# Patient Record
Sex: Male | Born: 1955 | Race: White | Hispanic: No | Marital: Married | Smoking: Former smoker
Health system: Southern US, Community
[De-identification: ages and names within clinical notes are randomized; demographics above are authoritative.]

## PROBLEM LIST (undated history)

## (undated) DIAGNOSIS — Z1211 Encounter for screening for malignant neoplasm of colon: Secondary | ICD-10-CM

## (undated) DIAGNOSIS — K219 Gastro-esophageal reflux disease without esophagitis: Secondary | ICD-10-CM

## (undated) DIAGNOSIS — E119 Type 2 diabetes mellitus without complications: Secondary | ICD-10-CM

## (undated) DIAGNOSIS — D126 Benign neoplasm of colon, unspecified: Secondary | ICD-10-CM

## (undated) DIAGNOSIS — M67919 Unspecified disorder of synovium and tendon, unspecified shoulder: Secondary | ICD-10-CM

## (undated) DIAGNOSIS — E785 Hyperlipidemia, unspecified: Secondary | ICD-10-CM

## (undated) DIAGNOSIS — Z125 Encounter for screening for malignant neoplasm of prostate: Secondary | ICD-10-CM

## (undated) DIAGNOSIS — Z0000 Encounter for general adult medical examination without abnormal findings: Secondary | ICD-10-CM

## (undated) DIAGNOSIS — L309 Dermatitis, unspecified: Secondary | ICD-10-CM

## (undated) DIAGNOSIS — I2510 Atherosclerotic heart disease of native coronary artery without angina pectoris: Secondary | ICD-10-CM

## (undated) DIAGNOSIS — E039 Hypothyroidism, unspecified: Secondary | ICD-10-CM

## (undated) DIAGNOSIS — I10 Essential (primary) hypertension: Secondary | ICD-10-CM

## (undated) DIAGNOSIS — G4700 Insomnia, unspecified: Secondary | ICD-10-CM

## (undated) DIAGNOSIS — M719 Bursopathy, unspecified: Secondary | ICD-10-CM

## (undated) DIAGNOSIS — E7800 Pure hypercholesterolemia, unspecified: Secondary | ICD-10-CM

## (undated) HISTORY — DX: Unspecified disorder of synovium and tendon, unspecified shoulder: M67.919

## (undated) HISTORY — DX: Pure hypercholesterolemia, unspecified: E78.00

## (undated) HISTORY — DX: Type 2 diabetes mellitus without complications: E11.9

## (undated) HISTORY — DX: Encounter for screening for malignant neoplasm of prostate: Z12.5

## (undated) HISTORY — DX: Benign neoplasm of colon, unspecified: D12.6

## (undated) HISTORY — DX: Essential (primary) hypertension: I10

## (undated) HISTORY — DX: Atherosclerotic heart disease of native coronary artery without angina pectoris: I25.10

## (undated) HISTORY — DX: Dermatitis, unspecified: L30.9

## (undated) HISTORY — DX: Insomnia, unspecified: G47.00

## (undated) HISTORY — DX: Encounter for general adult medical examination without abnormal findings: Z00.00

## (undated) HISTORY — DX: Gastro-esophageal reflux disease without esophagitis: K21.9

## (undated) HISTORY — DX: Encounter for screening for malignant neoplasm of colon: Z12.11

## (undated) HISTORY — DX: Hypothyroidism, unspecified: E03.9

## (undated) HISTORY — DX: Hyperlipidemia, unspecified: E78.5

## (undated) HISTORY — DX: Unspecified disorder of synovium and tendon, unspecified shoulder: M71.9

---

## 2000-02-23 HISTORY — PX: HERNIA REPAIR: SHX51

## 2000-04-20 ENCOUNTER — Other Ambulatory Visit: Admission: RE | Admit: 2000-04-20 | Discharge: 2000-04-20 | Payer: Self-pay | Admitting: *Deleted

## 2001-02-22 HISTORY — PX: OTHER SURGICAL HISTORY: SHX169

## 2002-01-01 ENCOUNTER — Ambulatory Visit (HOSPITAL_COMMUNITY): Admission: RE | Admit: 2002-01-01 | Discharge: 2002-01-01 | Payer: Self-pay | Admitting: Family Medicine

## 2002-01-01 ENCOUNTER — Encounter: Payer: Self-pay | Admitting: Family Medicine

## 2002-01-04 ENCOUNTER — Ambulatory Visit (HOSPITAL_COMMUNITY): Admission: RE | Admit: 2002-01-04 | Discharge: 2002-01-04 | Payer: Self-pay | Admitting: Family Medicine

## 2002-01-04 ENCOUNTER — Encounter: Payer: Self-pay | Admitting: Family Medicine

## 2002-01-10 ENCOUNTER — Encounter: Payer: Self-pay | Admitting: Family Medicine

## 2002-01-10 ENCOUNTER — Encounter (INDEPENDENT_AMBULATORY_CARE_PROVIDER_SITE_OTHER): Payer: Self-pay | Admitting: Specialist

## 2002-01-10 ENCOUNTER — Ambulatory Visit (HOSPITAL_COMMUNITY): Admission: RE | Admit: 2002-01-10 | Discharge: 2002-01-10 | Payer: Self-pay | Admitting: Family Medicine

## 2002-02-22 HISTORY — PX: THYROIDECTOMY: SHX17

## 2002-06-11 ENCOUNTER — Encounter: Payer: Self-pay | Admitting: General Surgery

## 2002-06-16 ENCOUNTER — Inpatient Hospital Stay (HOSPITAL_COMMUNITY): Admission: RE | Admit: 2002-06-16 | Discharge: 2002-06-17 | Payer: Self-pay | Admitting: General Surgery

## 2003-02-23 HISTORY — PX: DG ESOPHAGUS -BA SW: HXRAD302

## 2003-03-25 ENCOUNTER — Ambulatory Visit (HOSPITAL_COMMUNITY): Admission: RE | Admit: 2003-03-25 | Discharge: 2003-03-25 | Payer: Self-pay | Admitting: General Surgery

## 2003-05-24 ENCOUNTER — Ambulatory Visit (HOSPITAL_COMMUNITY): Admission: RE | Admit: 2003-05-24 | Discharge: 2003-05-24 | Payer: Self-pay | Admitting: General Surgery

## 2004-02-23 ENCOUNTER — Encounter: Payer: Self-pay | Admitting: Family Medicine

## 2005-12-15 ENCOUNTER — Ambulatory Visit: Payer: Self-pay | Admitting: Family Medicine

## 2005-12-29 ENCOUNTER — Ambulatory Visit: Payer: Self-pay | Admitting: Family Medicine

## 2006-01-13 IMAGING — RF DG ESOPHAGUS
12 of 14 series · 18 of 24 positions shown · non-contrast
Comparison: none

[Series 1: run · 1 of 1 slices shown (1 of 12)]
[im 1/1]
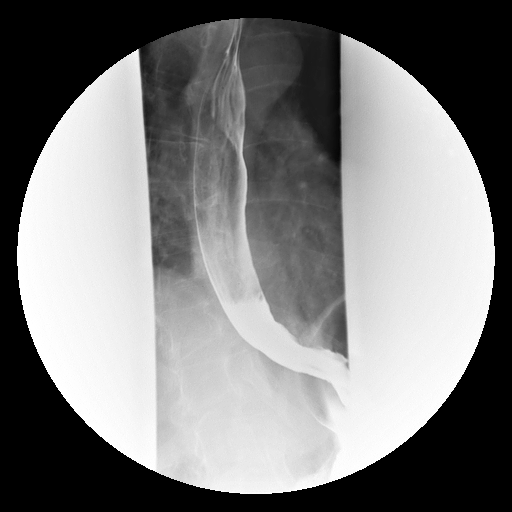

[Series 3: run · 1 of 1 slices shown (2 of 12)]
[im 1/1]
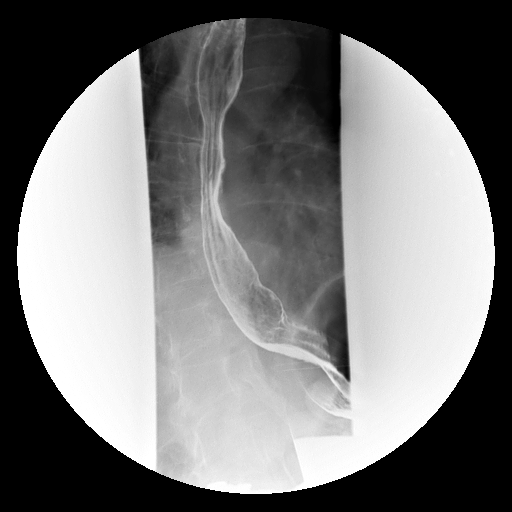

[Series 4: run · 1 of 1 slices shown (3 of 12)]
[im 1/1]
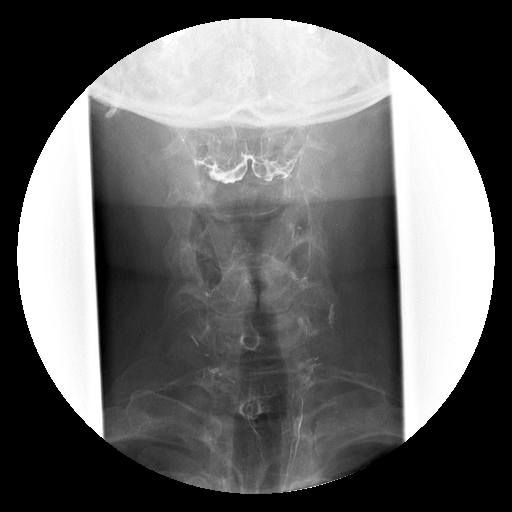

[Series 5: run · 1 of 1 slices shown (4 of 12)]
[im 1/1]
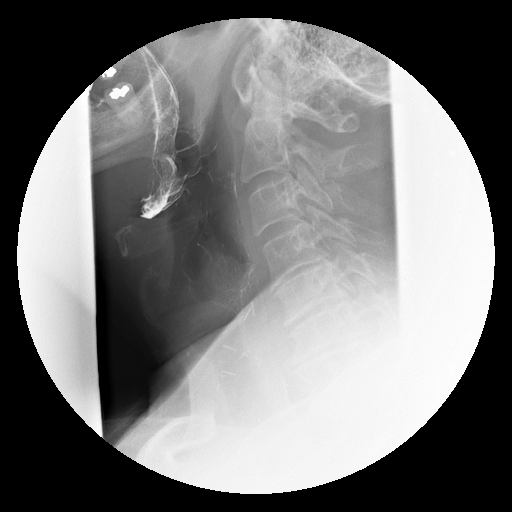

[Series 6: run · 2 of 4 slices shown (5 of 12)]
[im 2/4]
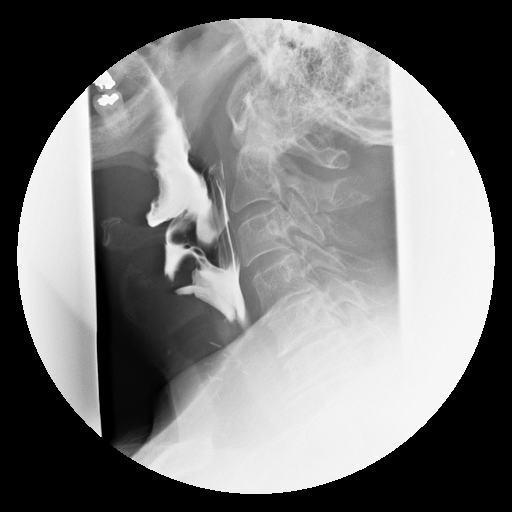
[im 4/4]
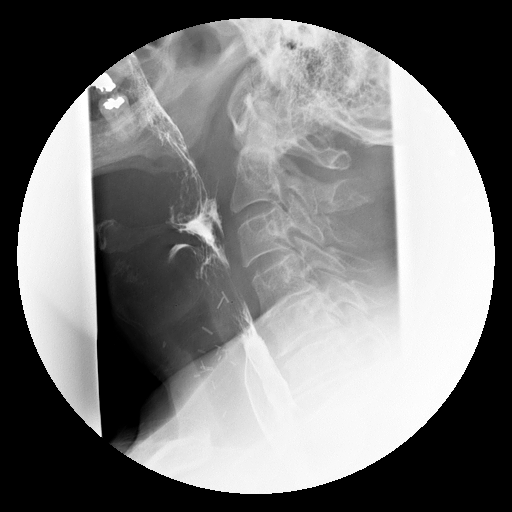

[Series 7: run · 2 of 4 slices shown (6 of 12)]
[im 1/4]
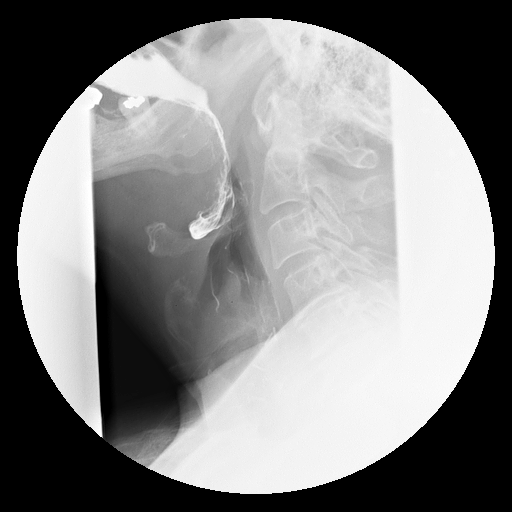
[im 4/4]
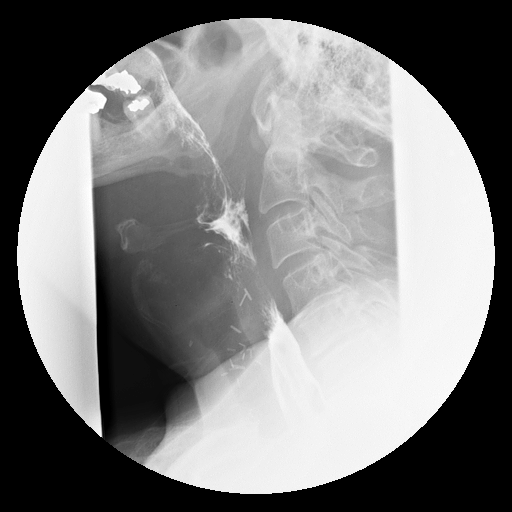

[Series 8: run · 4 of 5 slices shown (7 of 12)]
[im 1/5]
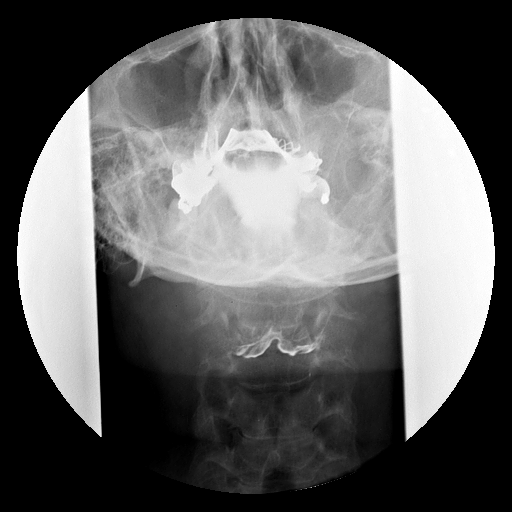
[im 2/5]
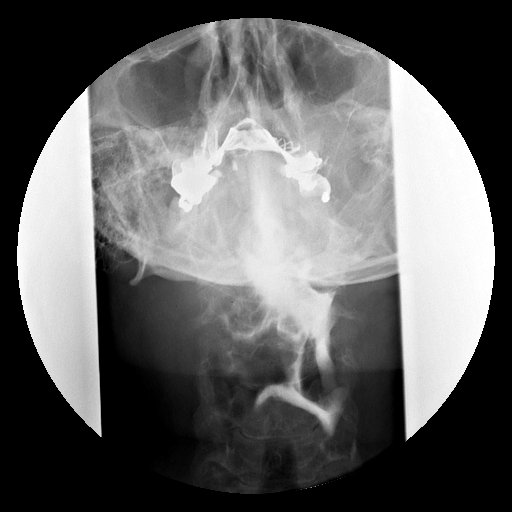
[im 4/5]
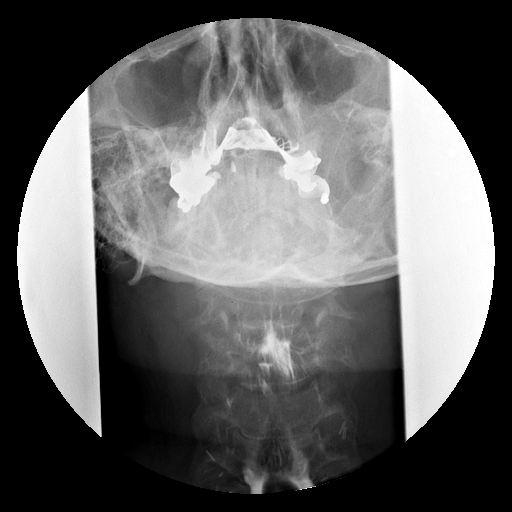
[im 5/5]
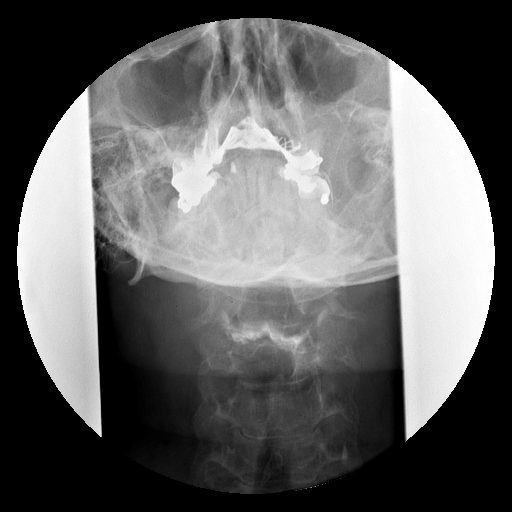

[Series 9: run · 2 of 3 slices shown (8 of 12)]
[im 1/3]
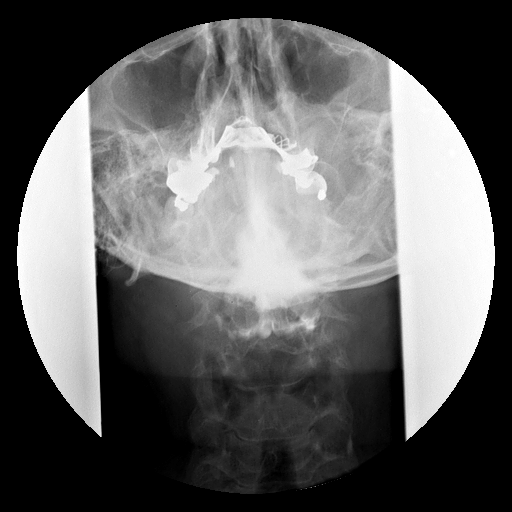
[im 3/3]
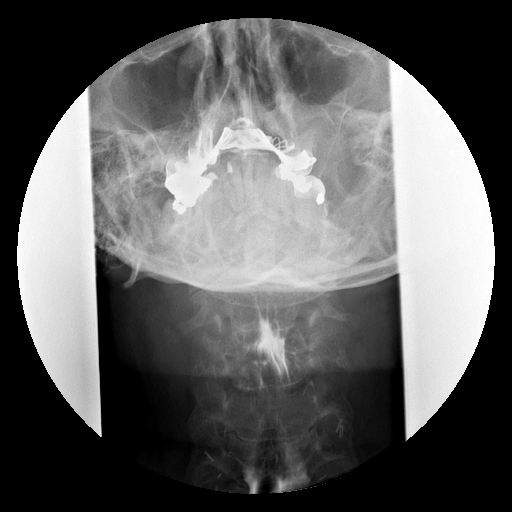

[Series 11: run · 1 of 1 slices shown (9 of 12)]
[im 1/1]
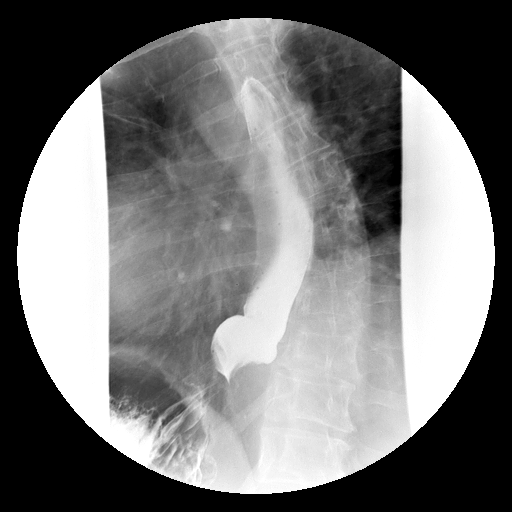

[Series 12: run · 1 of 1 slices shown (10 of 12)]
[im 1/1]
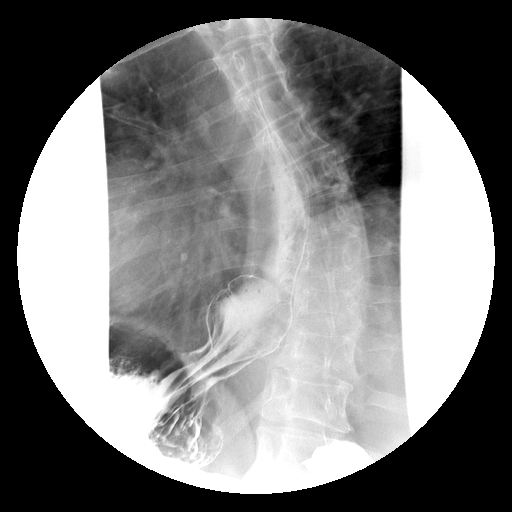

[Series 16: run · 1 of 1 slices shown (11 of 12)]
[im 1/1]
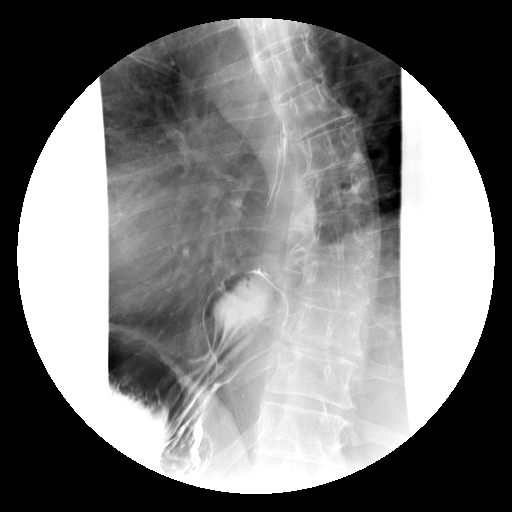

[Series 17: run · 1 of 1 slices shown (12 of 12)]
[im 1/1]
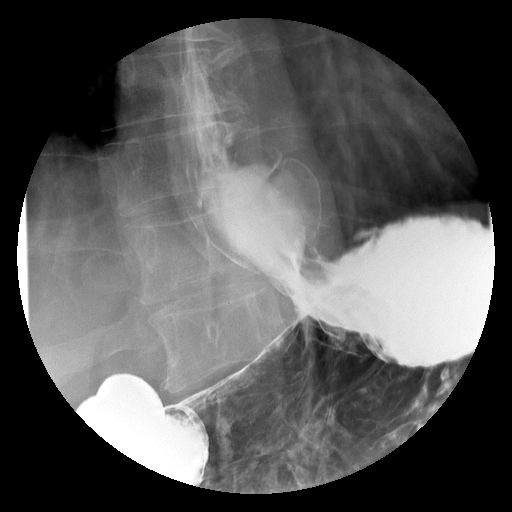

[18 of 24 positions shown; findings below may reference images not displayed]

<DIV class=VectionV><P class=MsoNormal><FONT size=3>Clinical data:<SPAN style="mso-spacerun: yes">  </SPAN><SPAN style="mso-spacerun: yes"> </SPAN>47 year old status post thyroidectomy.<SPAN style="mso-spacerun: yes">  </SPAN>Now presenting with cervical dysphagia.</FONT>
<P class=MsoNormal><FONT size=3>ESOPHAGRAM ? 03/25/03 </FONT>
<P class=MsoNormal><FONT size=3>Comparison:<SPAN style="mso-spacerun: yes">  </SPAN>None. </FONT>
<P class=MsoNormal><FONT size=3>The patient swallowed the thick and thin barium liquid without difficulty. Esophageal peristalsis appeared normal.<SPAN style="mso-spacerun: yes">  </SPAN>There is a moderate sized hiatal hernia, and gastroesophageal reflux was elicited with the use of the water siphon maneuver.<SPAN style="mso-spacerun: yes">  </SPAN>There is no radiographic evidence of esophagitis. No fixed esophageal strictures or masses were identified.</FONT>
<P class=MsoNormal><FONT size=3>Rapid sequence evaluation of the cervical esophagus during swallowing demonstrates trace laryngeal penetration.<SPAN style="mso-spacerun: yes">  </SPAN>There is no evidence of tracheal aspiration. <SPAN style="mso-spacerun: yes"> </SPAN>There may be weakness in the left side of the pharynx, as on one of the swallows in the AP projection, the majority of the thin barium liquid passed through the left side of the pharynx.<SPAN style="mso-spacerun: yes">  </SPAN></FONT>
<P class=MsoNormal><FONT size=3>IMPRESSION</FONT>
<P class=MsoNormal><FONT size=3>1.<SPAN style="mso-spacerun: yes">  </SPAN>Trace laryngeal penetration with thin barium liquid.<SPAN style="mso-spacerun: yes">  </SPAN>No evidence of tracheal aspiration.</FONT>
<P class=MsoNormal><FONT size=3>2.<SPAN style="mso-spacerun: yes">  </SPAN>Query left pharyngeal muscle weakness.</FONT>
<P class=MsoNormal><FONT size=3>3.<SPAN style="mso-spacerun: yes">  </SPAN>Moderate hiatal hernia with gastroesophageal reflux.</FONT>
<P class=MsoNormal><FONT size=3>4.<SPAN style="mso-spacerun: yes">  </SPAN>No evidence of fixed esophageal strictures or masses.</FONT>
<P class=MsoNormal><FONT size=3> </FONT>
</DIV>

## 2006-03-14 ENCOUNTER — Ambulatory Visit: Payer: Self-pay | Admitting: Family Medicine

## 2006-03-14 IMAGING — CT CT NECK W/ CM
1 of 3 series · 8 of 14 positions shown, 10 images · IV contrast (omnipaque)
Comparison: none

CLINICAL DATA: Patient with dysphagia, status post thyroidectomy.
 CT SCAN OF THE NECK WITH CONTRAST
 Multiple spiral images were made through the neck after the intravenous injection of 977cc Omnipaque 300.  Spiral images through the neck demonstrate normal salivary glands.  There are normal pharyngeal soft tissues.  No asymmetry is present.  There is no adenopathy.  The jugular veins and carotid arteries are widely patent.  There is no residual thyroid tissue evident.  
 IMPRESSION
 Normal CT scan of the neck status post thyroidectomy.

[Series 2: neck 3.0 b10s · axial · 0.39mm/px · z∈[-794,-626]mm · 8 of 73 slices shown, 10 images]
[im 9/73  soft-tissue]
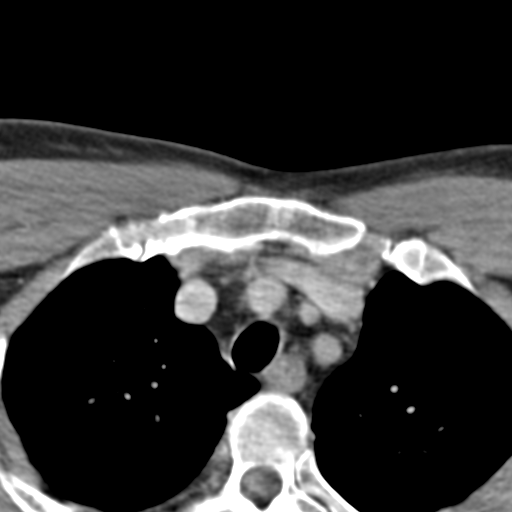
[im 9/73  bone]
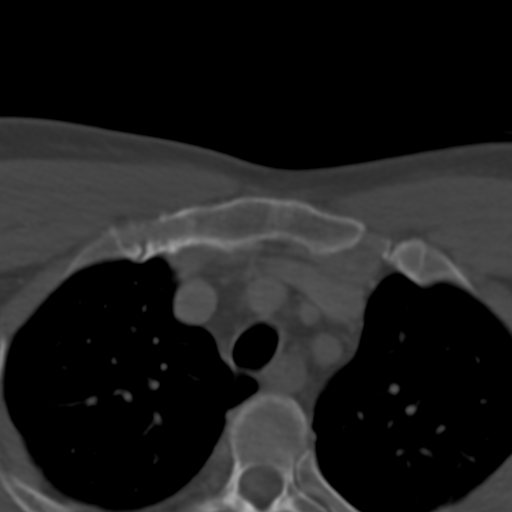
[im 17/73  bone]
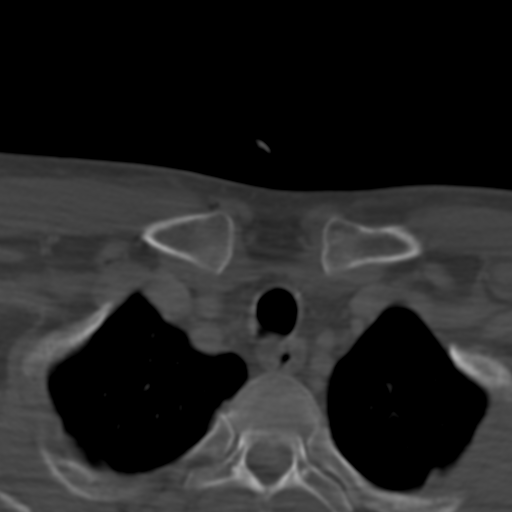
[im 25/73  bone]
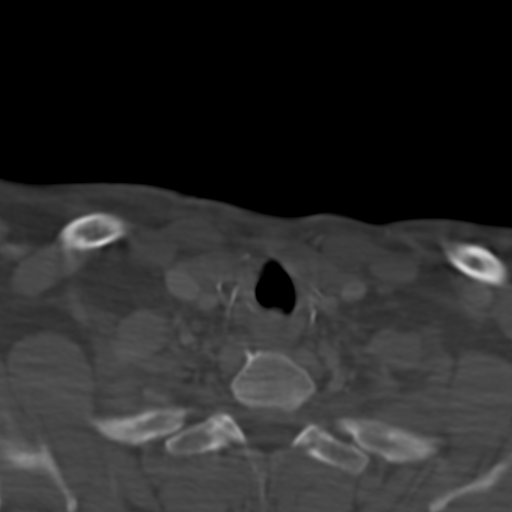
[im 33/73  bone]
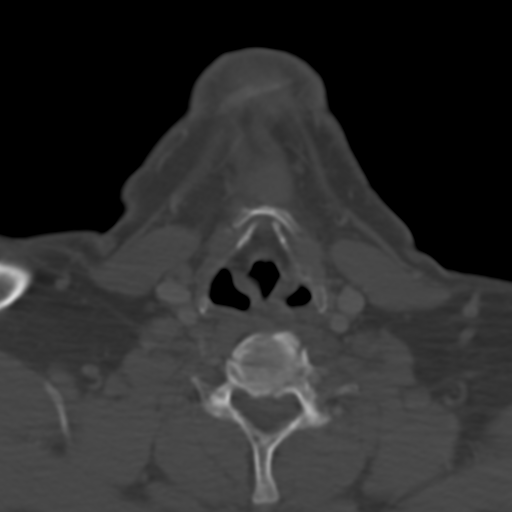
[im 41/73  soft-tissue]
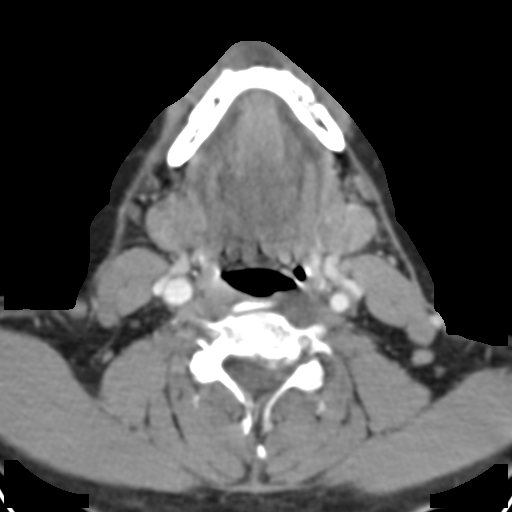
[im 41/73  bone]
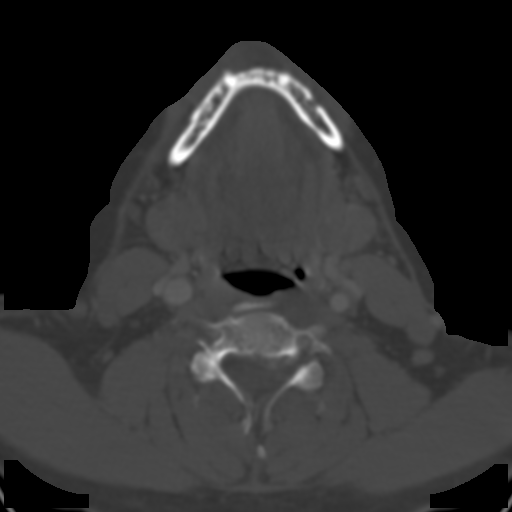
[im 49/73  bone]
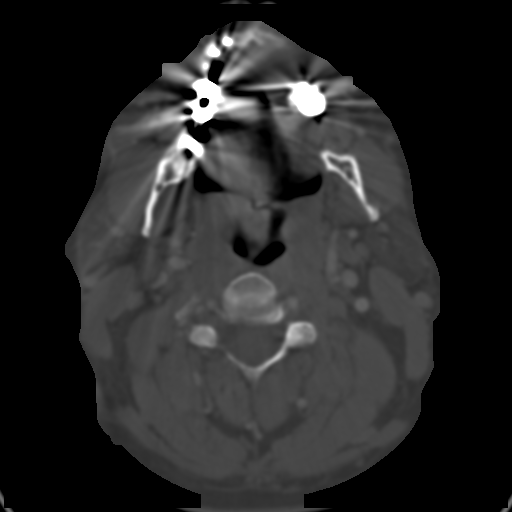
[im 57/73  bone]
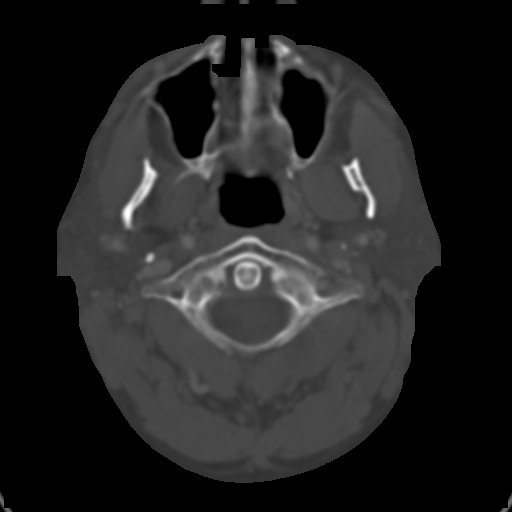
[im 65/73  bone]
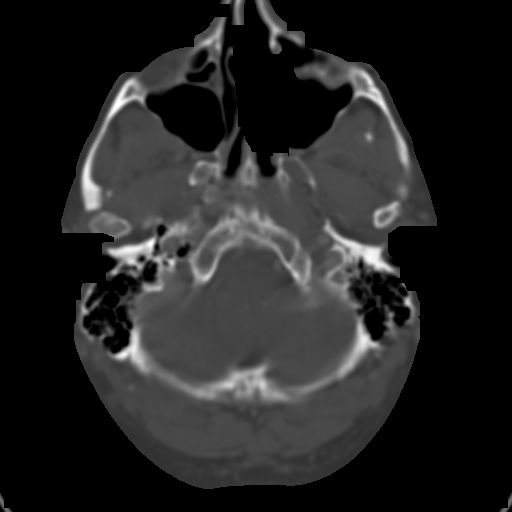

[8 of 14 positions shown; findings below may reference images not displayed]

## 2006-03-22 ENCOUNTER — Ambulatory Visit: Payer: Self-pay | Admitting: Family Medicine

## 2006-03-22 LAB — CONVERTED CEMR LAB
Folate: 10.1 ng/mL
TSH: 0.2 microintl units/mL — ABNORMAL LOW (ref 0.35–5.50)
Vitamin B-12: 217 pg/mL (ref 211–911)

## 2006-03-25 ENCOUNTER — Ambulatory Visit: Payer: Self-pay | Admitting: Family Medicine

## 2006-05-23 ENCOUNTER — Ambulatory Visit: Payer: Self-pay | Admitting: Family Medicine

## 2006-05-23 LAB — CONVERTED CEMR LAB: TSH: 1.87 microintl units/mL (ref 0.35–5.50)

## 2006-06-02 ENCOUNTER — Encounter: Payer: Self-pay | Admitting: Family Medicine

## 2006-06-15 ENCOUNTER — Encounter: Payer: Self-pay | Admitting: Family Medicine

## 2006-06-15 DIAGNOSIS — I10 Essential (primary) hypertension: Secondary | ICD-10-CM | POA: Insufficient documentation

## 2006-06-15 DIAGNOSIS — K219 Gastro-esophageal reflux disease without esophagitis: Secondary | ICD-10-CM

## 2006-06-15 DIAGNOSIS — E7800 Pure hypercholesterolemia, unspecified: Secondary | ICD-10-CM | POA: Insufficient documentation

## 2006-06-15 DIAGNOSIS — E1169 Type 2 diabetes mellitus with other specified complication: Secondary | ICD-10-CM | POA: Insufficient documentation

## 2006-06-15 DIAGNOSIS — E039 Hypothyroidism, unspecified: Secondary | ICD-10-CM | POA: Insufficient documentation

## 2006-06-20 DIAGNOSIS — G4700 Insomnia, unspecified: Secondary | ICD-10-CM | POA: Insufficient documentation

## 2007-01-04 ENCOUNTER — Encounter (INDEPENDENT_AMBULATORY_CARE_PROVIDER_SITE_OTHER): Payer: Self-pay | Admitting: *Deleted

## 2007-02-23 DIAGNOSIS — D126 Benign neoplasm of colon, unspecified: Secondary | ICD-10-CM

## 2007-02-23 HISTORY — PX: CARDIAC CATHETERIZATION: SHX172

## 2007-02-23 HISTORY — DX: Benign neoplasm of colon, unspecified: D12.6

## 2007-02-24 ENCOUNTER — Telehealth: Payer: Self-pay | Admitting: Family Medicine

## 2007-04-17 ENCOUNTER — Ambulatory Visit: Payer: Self-pay | Admitting: Cardiovascular Disease

## 2007-04-17 ENCOUNTER — Observation Stay (HOSPITAL_COMMUNITY): Admission: EM | Admit: 2007-04-17 | Discharge: 2007-04-18 | Payer: Self-pay | Admitting: Emergency Medicine

## 2007-04-18 ENCOUNTER — Ambulatory Visit: Payer: Self-pay | Admitting: Internal Medicine

## 2007-04-18 ENCOUNTER — Encounter (INDEPENDENT_AMBULATORY_CARE_PROVIDER_SITE_OTHER): Payer: Self-pay | Admitting: Cardiovascular Disease

## 2007-04-18 ENCOUNTER — Ambulatory Visit: Payer: Self-pay | Admitting: Surgery

## 2007-04-19 ENCOUNTER — Telehealth: Payer: Self-pay | Admitting: Family Medicine

## 2007-04-25 ENCOUNTER — Ambulatory Visit: Payer: Self-pay | Admitting: Family Medicine

## 2007-04-25 DIAGNOSIS — I2510 Atherosclerotic heart disease of native coronary artery without angina pectoris: Secondary | ICD-10-CM | POA: Insufficient documentation

## 2007-04-25 LAB — CONVERTED CEMR LAB
HDL: 33.7 mg/dL
Hgb A1c MFr Bld: 6.2 %
LDL Cholesterol: 91 mg/dL

## 2007-05-05 ENCOUNTER — Telehealth: Payer: Self-pay | Admitting: Family Medicine

## 2007-05-11 ENCOUNTER — Ambulatory Visit: Payer: Self-pay | Admitting: Family Medicine

## 2007-06-12 ENCOUNTER — Ambulatory Visit: Payer: Self-pay | Admitting: Gastroenterology

## 2007-06-21 ENCOUNTER — Telehealth: Payer: Self-pay | Admitting: Family Medicine

## 2007-06-26 ENCOUNTER — Encounter: Payer: Self-pay | Admitting: Family Medicine

## 2007-06-26 ENCOUNTER — Encounter: Payer: Self-pay | Admitting: Gastroenterology

## 2007-06-26 ENCOUNTER — Ambulatory Visit: Payer: Self-pay | Admitting: Gastroenterology

## 2007-06-28 ENCOUNTER — Encounter: Payer: Self-pay | Admitting: Gastroenterology

## 2007-07-28 ENCOUNTER — Ambulatory Visit: Payer: Self-pay | Admitting: Family Medicine

## 2007-07-31 LAB — CONVERTED CEMR LAB
AST: 26 units/L (ref 0–37)
Alkaline Phosphatase: 62 units/L (ref 39–117)
Bilirubin, Direct: 0.1 mg/dL (ref 0.0–0.3)
Chloride: 101 meq/L (ref 96–112)
Cholesterol: 147 mg/dL (ref 0–200)
GFR calc Af Amer: 82 mL/min
GFR calc non Af Amer: 68 mL/min
Glucose, Bld: 121 mg/dL — ABNORMAL HIGH (ref 70–99)
LDL Cholesterol: 86 mg/dL (ref 0–99)
Potassium: 3.8 meq/L (ref 3.5–5.1)
Sodium: 139 meq/L (ref 135–145)
VLDL: 29 mg/dL (ref 0–40)

## 2007-09-27 ENCOUNTER — Telehealth: Payer: Self-pay | Admitting: Family Medicine

## 2007-11-01 ENCOUNTER — Ambulatory Visit: Payer: Self-pay | Admitting: Family Medicine

## 2007-11-08 DIAGNOSIS — E119 Type 2 diabetes mellitus without complications: Secondary | ICD-10-CM | POA: Insufficient documentation

## 2007-11-08 DIAGNOSIS — E1159 Type 2 diabetes mellitus with other circulatory complications: Secondary | ICD-10-CM | POA: Insufficient documentation

## 2007-11-13 LAB — CONVERTED CEMR LAB
Bilirubin, Direct: 0.1 mg/dL (ref 0.0–0.3)
Calcium: 9 mg/dL (ref 8.4–10.5)
GFR calc Af Amer: 82 mL/min
GFR calc non Af Amer: 68 mL/min
HDL: 32.8 mg/dL — ABNORMAL LOW (ref >39.0)
Potassium: 4 meq/L (ref 3.5–5.1)
Sodium: 142 meq/L (ref 135–145)
Total Bilirubin: 1 mg/dL (ref 0.3–1.2)
Total CHOL/HDL Ratio: 4.5
VLDL: 25 mg/dL (ref 0–40)

## 2007-12-01 ENCOUNTER — Telehealth: Payer: Self-pay | Admitting: Family Medicine

## 2008-02-08 ENCOUNTER — Ambulatory Visit: Payer: Self-pay | Admitting: Family Medicine

## 2008-02-08 LAB — CONVERTED CEMR LAB
ALT: 38 units/L (ref 0–53)
CO2: 33 meq/L — ABNORMAL HIGH (ref 19–32)
Calcium: 9.6 mg/dL (ref 8.4–10.5)
Creatinine, Ser: 1.1 mg/dL (ref 0.4–1.5)
GFR calc Af Amer: 91 mL/min
GFR calc non Af Amer: 75 mL/min
HDL: 37.5 mg/dL — ABNORMAL LOW (ref >39.0)
Hgb A1c MFr Bld: 6.1 % — ABNORMAL HIGH (ref 4.6–6.0)
LDL Cholesterol: 74 mg/dL (ref 0–99)
Total Bilirubin: 0.9 mg/dL (ref 0.3–1.2)
Total CHOL/HDL Ratio: 3.4
Triglycerides: 82 mg/dL (ref 0–149)
VLDL: 16 mg/dL (ref 0–40)

## 2008-02-19 ENCOUNTER — Telehealth: Payer: Self-pay | Admitting: Family Medicine

## 2008-04-29 ENCOUNTER — Telehealth: Payer: Self-pay | Admitting: Family Medicine

## 2008-06-13 ENCOUNTER — Ambulatory Visit: Payer: Self-pay | Admitting: Family Medicine

## 2008-06-14 LAB — CONVERTED CEMR LAB
ALT: 37 U/L (ref 0–53)
AST: 32 U/L (ref 0–37)
Albumin: 4.1 g/dL (ref 3.5–5.2)
Alkaline Phosphatase: 76 U/L (ref 39–117)
BUN: 13 mg/dL (ref 6–23)
Bilirubin, Direct: 0.1 mg/dL (ref 0.0–0.3)
CO2: 33 meq/L — ABNORMAL HIGH (ref 19–32)
Calcium: 9.4 mg/dL (ref 8.4–10.5)
Chloride: 104 meq/L (ref 96–112)
Cholesterol: 147 mg/dL (ref 0–200)
Creatinine, Ser: 1.2 mg/dL (ref 0.4–1.5)
Glucose, Bld: 110 mg/dL — ABNORMAL HIGH (ref 70–99)
HDL: 40.1 mg/dL (ref >39.00)
Hgb A1c MFr Bld: 6 % (ref 4.6–6.5)
LDL Cholesterol: 87 mg/dL (ref 0–99)
Phosphorus: 3.7 mg/dL (ref 2.3–4.6)
Potassium: 4.4 meq/L (ref 3.5–5.1)
Sodium: 142 meq/L (ref 135–145)
Total Bilirubin: 0.9 mg/dL (ref 0.3–1.2)
Total CHOL/HDL Ratio: 4
Total Protein: 6.7 g/dL (ref 6.0–8.3)
Triglycerides: 98 mg/dL (ref 0.0–149.0)
VLDL: 19.6 mg/dL (ref 0.0–40.0)

## 2008-06-19 ENCOUNTER — Ambulatory Visit: Payer: Self-pay | Admitting: Family Medicine

## 2008-07-10 DIAGNOSIS — E785 Hyperlipidemia, unspecified: Secondary | ICD-10-CM

## 2008-07-11 ENCOUNTER — Ambulatory Visit: Payer: Self-pay | Admitting: Cardiovascular Disease

## 2008-07-11 DIAGNOSIS — R0602 Shortness of breath: Secondary | ICD-10-CM | POA: Insufficient documentation

## 2008-07-26 ENCOUNTER — Ambulatory Visit: Payer: Self-pay

## 2008-07-26 ENCOUNTER — Ambulatory Visit: Payer: Self-pay | Admitting: Cardiovascular Disease

## 2008-08-05 ENCOUNTER — Telehealth: Payer: Self-pay | Admitting: Family Medicine

## 2008-08-21 ENCOUNTER — Ambulatory Visit: Payer: Self-pay | Admitting: Family Medicine

## 2008-08-23 LAB — CONVERTED CEMR LAB: PSA: 0.96 ng/mL (ref 0.10–4.00)

## 2008-08-30 ENCOUNTER — Ambulatory Visit: Payer: Self-pay | Admitting: Family Medicine

## 2008-08-30 DIAGNOSIS — R1031 Right lower quadrant pain: Secondary | ICD-10-CM | POA: Insufficient documentation

## 2008-08-30 LAB — CONVERTED CEMR LAB
Blood in Urine, dipstick: NEGATIVE
Glucose, Urine, Semiquant: NEGATIVE
Ketones, urine, test strip: NEGATIVE

## 2008-11-26 ENCOUNTER — Ambulatory Visit: Payer: Self-pay | Admitting: Family Medicine

## 2008-11-28 LAB — CONVERTED CEMR LAB
ALT: 29 units/L (ref 0–53)
Albumin: 4.5 g/dL (ref 3.5–5.2)
Alkaline Phosphatase: 58 units/L (ref 39–117)
CO2: 33 meq/L — ABNORMAL HIGH (ref 19–32)
Calcium: 9.7 mg/dL (ref 8.4–10.5)
Chloride: 105 meq/L (ref 96–112)
Creatinine, Ser: 1.1 mg/dL (ref 0.4–1.5)
Glucose, Bld: 104 mg/dL — ABNORMAL HIGH (ref 70–99)
HDL: 43.4 mg/dL (ref >39.00)
Sodium: 141 meq/L (ref 135–145)
Total Protein: 7.6 g/dL (ref 6.0–8.3)
Triglycerides: 123 mg/dL (ref 0.0–149.0)

## 2008-12-17 ENCOUNTER — Encounter: Payer: Self-pay | Admitting: Family Medicine

## 2008-12-18 ENCOUNTER — Ambulatory Visit: Payer: Self-pay | Admitting: Family Medicine

## 2008-12-18 DIAGNOSIS — R0789 Other chest pain: Secondary | ICD-10-CM | POA: Insufficient documentation

## 2008-12-18 DIAGNOSIS — J310 Chronic rhinitis: Secondary | ICD-10-CM | POA: Insufficient documentation

## 2009-12-12 ENCOUNTER — Telehealth (INDEPENDENT_AMBULATORY_CARE_PROVIDER_SITE_OTHER): Payer: Self-pay | Admitting: *Deleted

## 2009-12-12 ENCOUNTER — Ambulatory Visit: Payer: Self-pay | Admitting: Family Medicine

## 2009-12-12 LAB — CONVERTED CEMR LAB
ALT: 24 units/L (ref 0–53)
AST: 25 units/L (ref 0–37)
Alkaline Phosphatase: 65 units/L (ref 39–117)
Bilirubin, Direct: 0.1 mg/dL (ref 0.0–0.3)
CO2: 33 meq/L — ABNORMAL HIGH (ref 19–32)
Chloride: 104 meq/L (ref 96–112)
Creatinine, Ser: 1.1 mg/dL (ref 0.4–1.5)
Microalb Creat Ratio: 0.4 mg/g (ref 0.0–30.0)
PSA: 1.62 ng/mL (ref 0.10–4.00)
Potassium: 4.6 meq/L (ref 3.5–5.1)
Sodium: 142 meq/L (ref 135–145)
Total CHOL/HDL Ratio: 3
Total Protein: 6.5 g/dL (ref 6.0–8.3)
Triglycerides: 72 mg/dL (ref 0.0–149.0)

## 2009-12-17 ENCOUNTER — Ambulatory Visit: Payer: Self-pay | Admitting: Family Medicine

## 2009-12-17 DIAGNOSIS — Z87898 Personal history of other specified conditions: Secondary | ICD-10-CM

## 2010-01-26 ENCOUNTER — Telehealth: Payer: Self-pay | Admitting: Family Medicine

## 2010-02-05 IMAGING — CR DG CHEST 1V PORT
1 series · 1 of 1 positions shown · non-contrast
Comparison: None available. Report from 06/11/2002.

CLINICAL DATA: Chest pain.

PORTABLE CHEST - 1 VIEW
TECHNIQUE: Portable semierect AP chest.

[AP]
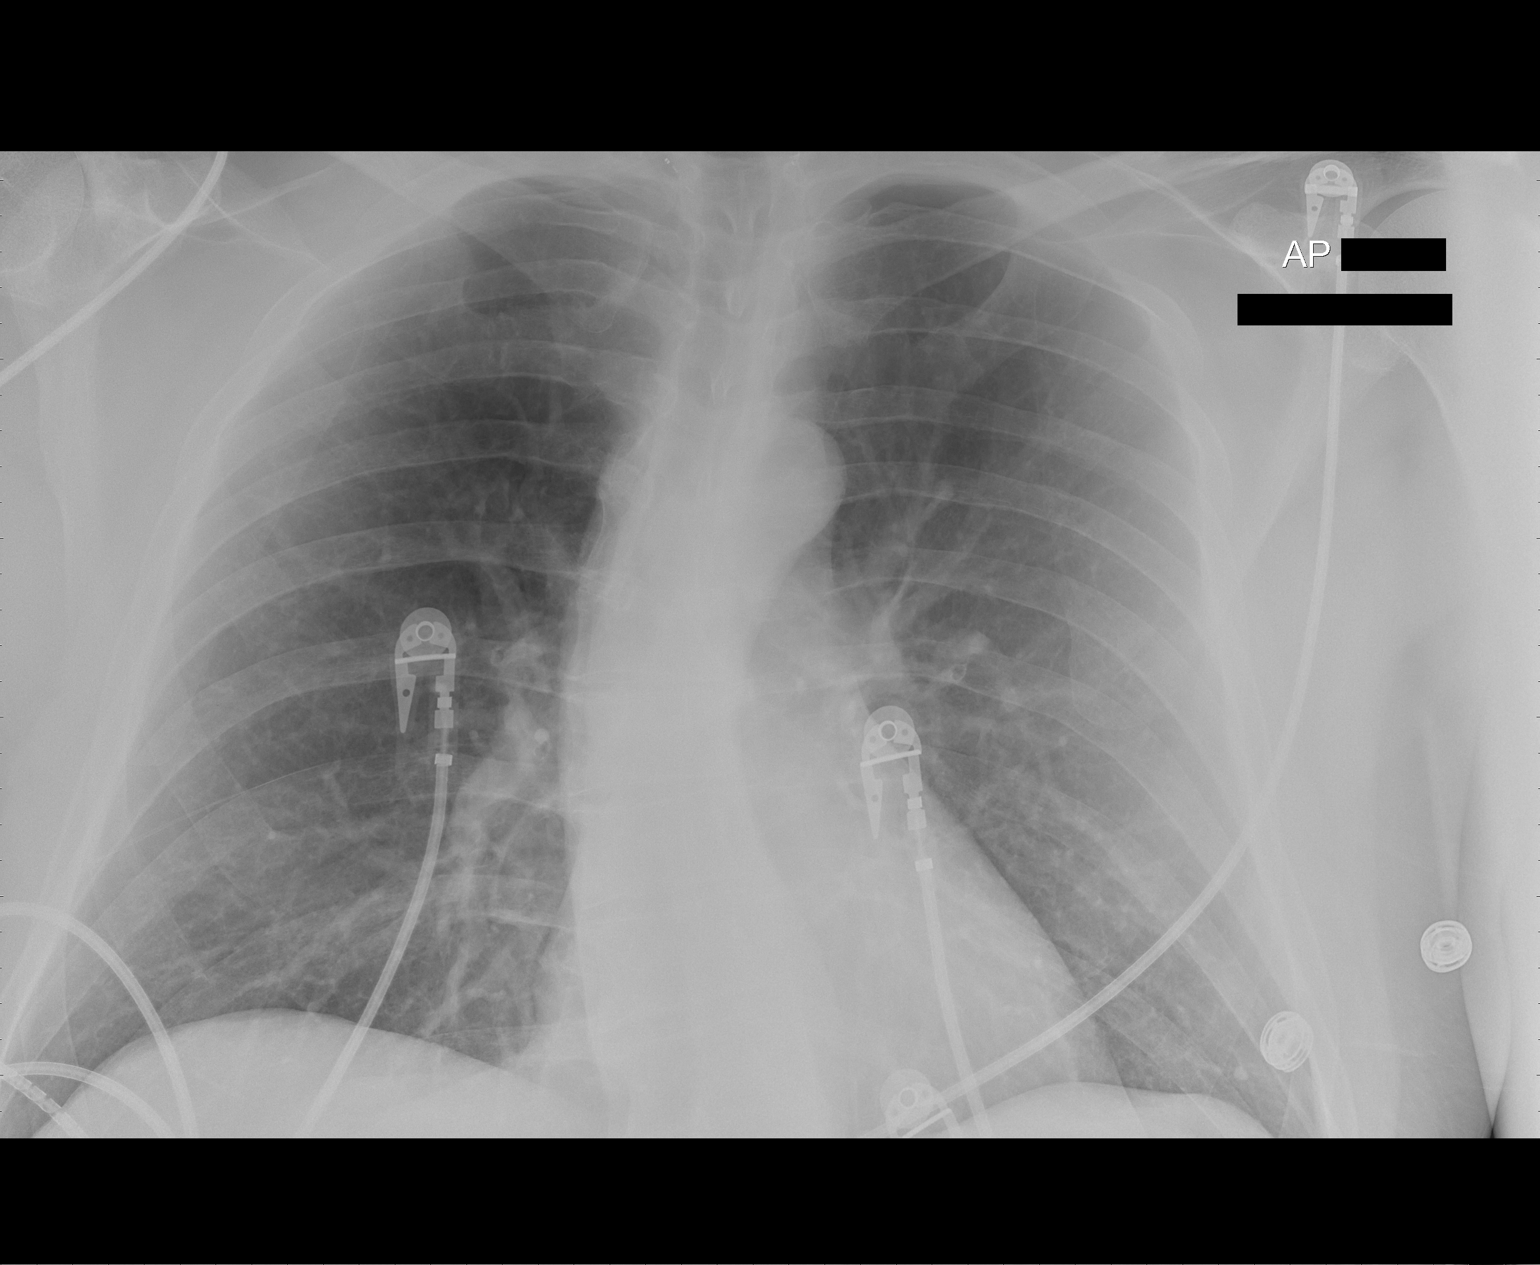

[1 of 1 positions shown; findings below may reference images not displayed]

FINDINGS: Dextro-convex thoracic scoliosis. Lungs clear. No edema or effusion.
No pneumothorax. Mild deformity of the mediastinal contours from scoliosis.
Aorta appears within normal limits. Cardiopericardial size normal.  

IMPRESSION
No active cardiopulmonary disease.

## 2010-03-22 LAB — CONVERTED CEMR LAB: TSH: 3.05 microintl units/mL (ref 0.35–5.50)

## 2010-03-26 NOTE — Progress Notes (Signed)
----   Converted from flag ---- ---- 12/11/2009 10:30 PM, Kerby Nora MD wrote: Roland Rack Dx 244.9, CMET, lipids Dx 272.0, A1C, microalbumin Dx 250.00, PSA V76.44  ---- 12/11/2009 9:40 AM, Liane Comber CMA (AAMA) wrote: Lab orders please! Good Morning! This pt is scheduled for cpx labs tomorrow, which labs to draw and dx codes to use? Thanks Tasha ------------------------------

## 2010-03-26 NOTE — Progress Notes (Signed)
Summary: pt wants to change meds  Phone Note Call from Patient Call back at [phone]   Caller: Patient Call For: Kerby Nora MD Summary of Call: Pt was given azelastine for sinus sxs at last office visit but that is not helping.  He is asking if he can have something else called to [address].   He was also given generic flomax, but says that has helped some, but he still gets up a couple of times a night to void, still has frequent urination during the day.  He is asking if he can try something else for that as well. Initial call taken by: Lowella Petties CMA, AAMA,  January 26, 2010 11:12 AM  Follow-up for Phone Call        multiproblem. Recommend reevaluation face to face by MD.  Hannah Beat MD  January 26, 2010 5:22 PM   Additional Follow-up for Phone Call Additional follow up Details #1::        left message at work number to return my call Additional Follow-up by: Benny Lennert CMA Duncan Dull),  January 27, 2010 1:20 PM    Additional Follow-up for Phone Call Additional follow up Details #2::    Patient says that he just has a complete physcial in 12-17-2009 Follow-up by: Benny Lennert CMA Duncan Dull),  January 28, 2010 8:52 AM  Additional Follow-up for Phone Call Additional follow up Details #3:: Details for Additional Follow-up Action Taken: Continue flomax but add avodart... will decrease size of prostate..take several months to work so give it time. Continue flomax as well as it work in different way.   If interested refer to ENT for nasal congestion. Additional Follow-up by: Kerby Nora MD,  January 28, 2010 11:05 AM  New/Updated Medications: AVODART 0.5 MG CAPS (DUTASTERIDE) 1 tab by mouth daily Prescriptions: AVODART 0.5 MG CAPS (DUTASTERIDE) 1 tab by mouth daily  #30 x 5   Entered and Authorized by:   Kerby Nora MD   Signed by:   Kerby Nora MD on 01/28/2010   Method used:   [facility] to        [facility]  [address] [phone][facility] (retail)       [address]       [place], [place]  [address]       Ph: [phone]       Fax: [phone]   RxID:   [id number]  Patient advised and he does want the ENT referral but, wants appt after the first of the year.Consuello Masse CMA

## 2010-03-26 NOTE — Assessment & Plan Note (Signed)
Summary: CPX/JRR   Vital Signs:  Patient profile:   55 year old male Height:      70 inches Weight:      171.0 pounds BMI:     24.62 Temp:     99.0 degrees F oral Pulse rate:   72 / minute Pulse rhythm:   regular BP sitting:   110 / 80  (left arm) Cuff size:   regular  Vitals Entered By: Benny Lennert CMA Duncan Dull) (December 17, 2009 3:38 PM)  History of Present Illness: Chief complaint cpx  The patient is here for annual wellness exam and preventative care.     HTN, well controlled on amlodipine. At home  120/70  Nonobstructive CAD.Marland Kitchenseen Dr. Eden Emms in past.  Last stress test 2 years ago..nml.   High cholesterol..Goal LDL <70  on maximum crestor.  Walks every few days. healthy diet..  PSa in last year increased but stble from year before..will follow.  Continues to have nasal congestion...mainly at night cannot breath through nose.  Got off afrin in past with prednisone..no improvement with nasal steroid spray...wants to try different nasal spray.   Hypertension History:      He denies headache, chest pain, palpitations, dyspnea with exertion, orthopnea, peripheral edema, visual symptoms, neurologic problems, syncope, and side effects from treatment.        Positive major cardiovascular risk factors include male age 20 years old or older, diabetes, hyperlipidemia, and hypertension.  Negative major cardiovascular risk factors include negative family history for ischemic heart disease and non-tobacco-user status.        Positive history for target organ damage include ASHD (either angina/prior MI/prior CABG).  Further assessment for target organ damage reveals no history of cardiac end-organ damage (CHF/LVH), stroke/TIA, peripheral vascular disease, renal insufficiency, or hypertensive retinopathy.     Problems Prior to Update: 1)  Chronic Rhinitis  (ICD-472.0) 2)  ? of Inguinal Hernia, Right  (ICD-550.90) 3)  Abdominal Pain, Right Lower Quadrant  (ICD-789.03) 4)  Shortness  of Breath  (ICD-786.05) 5)  Hypertension  (ICD-401.9) 6)  Cad  (ICD-414.00) 7)  Dyslipidemia  (ICD-272.4) 8)  Chest Pain, Atypical  (ICD-786.59) 9)  Diabetes Mellitus, Without Complications  (ICD-250.00) 10)  Well Adult  (ICD-V70.0) 11)  Special Screening Malignant Neoplasm of Prostate  (ICD-V76.44) 12)  Special Screening For Malignant Neoplasms Colon  (ICD-V76.51) 13)  Insomnia  (ICD-780.52) 14)  Rotator Cuff Syndrome Tendonitis, Right  (ICD-726.10) 15)  Gerd  (ICD-530.81) 16)  Hypothyroidism  (ICD-244.9) 17)  Hypercholesterolemia  (ICD-272.0)  Current Medications (verified): 1)  Synthroid 137 Mcg Tabs (Levothyroxine Sodium) .... Take 1 Tablet By Mouth Once A Day 2)  Amlodipine Besylate 10 Mg  Tabs (Amlodipine Besylate) .... Take 1 Tablet By Mouth Once A Day 3)  Adult Aspirin Low Strength 81 Mg  Tbdp (Aspirin) .... Take 1 Tablet By Mouth Once A Day 4)  Lansoprazole 30 Mg Cpdr (Lansoprazole) .... Take 1 Tab By Mouth Daily 5)  Crestor 20 Mg Tabs (Rosuvastatin Calcium) .... Take One Tablet By Mouth Daily.  Allergies (verified): No Known Drug Allergies  Past History:  Past medical, surgical, family and social histories (including risk factors) reviewed, and no changes noted (except as noted below).  Past Medical History: Reviewed history from 07/10/2008 and no changes required. Current Problems:  HYPERTENSION (ICD-401.9) CAD (ICD-414.00) DYSLIPIDEMIA (ICD-272.4) CHEST PAIN (ICD-786.50) DIABETES MELLITUS, WITHOUT COMPLICATIONS (ICD-250.00) WELL ADULT (ICD-V70.0) SPECIAL SCREENING MALIGNANT NEOPLASM OF PROSTATE (ICD-V76.44) SPECIAL SCREENING FOR MALIGNANT NEOPLASMS COLON (ICD-V76.51) INSOMNIA (ICD-780.52) ROTATOR  CUFF SYNDROME TENDONITIS, RIGHT (ICD-726.10) GERD (ICD-530.81) HYPOTHYROIDISM (ICD-244.9) HYPERCHOLESTEROLEMIA (ICD-272.0)  Past Surgical History: Reviewed history from 04/25/2007 and no changes required. 2004 THYROID NODULE - THYROIDECTOMY 2003  B EYE OP - CORNEA  TRANSPLANTS 2002 HERNIA REPAIR 2005 Ba ESOPH: MOD REFLUX AND HIATAL HERNIA 03/2007 Cardiac cath showing non obstructive CAD, medical management  Family History: Reviewed history from 06/15/2006 and no changes required. Father: ALIVE 91 HEALTHY Mother: ALIVE 29 CAD - HEART stent Siblings: 2 BROTHERS, 1 SISTER, HEALTHY DM:  +PGF PROSTATE CA:  + PGF ETOH:  SISTER no MI < age55  Social History: Reviewed history from 04/25/2007 and no changes required. Marital Status: Married X30 YEARS Children: 2 GIRLS, 67-68 YEARS OLD, HEALTHY Occupation: HEATING AND AIR REPAIR EXERCISE:  NONE DIET:  FRUITS, VEGGIES, OCC. FAST FOOD ON WEEKENDS Former Smoker smoker 40 pack year history  Review of Systems General:  Denies chills, fatigue, and fever. CV:  Denies chest pain or discomfort. Resp:  Denies shortness of breath. GI:  Denies abdominal pain. GU:  Complains of nocturia, urinary frequency, and urinary hesitancy; denies dysuria, hematuria, and incontinence; some mild urinary retention..if holds urine...ongoing x 6-7 months. Weak flow.  Alot of urgency and frequency. 3-4 times a tnight..  Physical Exam  General:  Well-developed,well-nourished,in no acute distress; alert,appropriate and cooperative throughout examination Ears:  External ear exam shows no significant lesions or deformities.  Otoscopic examination reveals clear canals, tympanic membranes are intact bilaterally without bulging, retraction, inflammation or discharge. Hearing is grossly normal bilaterally. Nose:  External nasal examination shows no deformity or inflammation. Nasal mucosa are pink and moist without lesions or exudates. Mouth:  Oral mucosa and oropharynx without lesions or exudates.  Teeth in good repair. Neck:  no carotid bruit or thyromegaly no cervical or supraclavicular lymphadenopathy  Lungs:  Normal respiratory effort, chest expands symmetrically. Lungs are clear to auscultation, no crackles or wheezes. Heart:   Normal rate and regular rhythm. S1 and S2 normal without gallop, murmur, click, rub or other extra sounds. Abdomen:  Bowel sounds positive,abdomen soft and non-tender without masses, organomegaly or hernias noted. Prostate:  no nodules, no asymmetry, and 2+ enlarged.   Msk:  No deformity or scoliosis noted of thoracic or lumbar spine.   Pulses:  R and L posterior tibial pulses are full and equal bilaterally  Extremities:  no edema Skin:  Intact without suspicious lesions or rashes Psych:  Cognition and judgment appear intact. Alert and cooperative with normal attention span and concentration. No apparent delusions, illusions, hallucinations  Diabetes Management Exam:    Foot Exam (with socks and/or shoes not present):       Sensory-Pinprick/Light touch:          Left medial foot (L-4): normal          Left dorsal foot (L-5): normal          Left lateral foot (S-1): normal          Right medial foot (L-4): normal          Right dorsal foot (L-5): normal          Right lateral foot (S-1): normal       Sensory-Monofilament:          Left foot: normal          Right foot: normal       Inspection:          Left foot: normal          Right foot:  normal       Nails:          Left foot: normal          Right foot: normal   Impression & Recommendations:  Problem # 1:  Preventive Health Care (ICD-V70.0) .The patient's preventative maintenance and recommended screening tests for an annual wellness exam were reviewed in full today. Brought up to date unless services declined.  Counselled on the importance of diet, exercise, and its role in overall health and mortality. The patient's FH and SH was reviewed, including their home life, tobacco status, and drug and alcohol status.     Problem # 2:  HYPERTENSION (ICD-401.9) Well controlled. Continue current medication.  The following medications were removed from the medication list:    Metoprolol Tartrate 25 Mg Tabs (Metoprolol tartrate)  .Marland Kitchen... Take 1/2 tab by mouth two times a day His updated medication list for this problem includes:    Amlodipine Besylate 10 Mg Tabs (Amlodipine besylate) .Marland Kitchen... Take 1 tablet by mouth once a day  Problem # 3:  DYSLIPIDEMIA (ICD-272.4) Almost at [facility] on ligfestyle and continue crestor. His updated medication list for this problem includes:    Crestor 20 Mg Tabs (Rosuvastatin calcium) .Marland Kitchen... Take one tablet by mouth daily.  Problem # 4:  CAD (ICD-414.00) Stable asymptomatic The following medications were removed from the medication list:    Metoprolol Tartrate 25 Mg Tabs (Metoprolol tartrate) .Marland Kitchen... Take 1/2 tab by mouth two times a day His updated medication list for this problem includes:    Amlodipine Besylate 10 Mg Tabs (Amlodipine besylate) .Marland Kitchen... Take 1 tablet by mouth once a day    Adult Aspirin Low Strength 81 Mg Tbdp (Aspirin) .Marland Kitchen... Take 1 tablet by mouth once a day  Problem # 5:  DIABETES MELLITUS, WITHOUT COMPLICATIONS (ICD-250.00)  Well controlled.  His updated medication list for this problem includes:    Adult Aspirin Low Strength 81 Mg Tbdp (Aspirin) .Marland Kitchen... Take 1 tablet by mouth once a day  Labs Reviewed: Creat: 1.1 (12/12/2009)     Last Eye Exam: normal (04/22/2008) Reviewed HgBA1c results: 6.1 (12/12/2009)  6.0 (11/26/2008)  Problem # 6:  CHRONIC RHINITIS (ICD-472.0) Trial of astelin generic.   Problem # 7:  BENIGN PROSTATIC HYPERTROPHY, HX OF (ICD-V13.8) Start flomax.   Complete Medication List: 1)  Synthroid 137 Mcg Tabs (Levothyroxine sodium) .... Take 1 tablet by mouth once a day 2)  Amlodipine Besylate 10 Mg Tabs (Amlodipine besylate) .... Take 1 tablet by mouth once a day 3)  Adult Aspirin Low Strength 81 Mg Tbdp (Aspirin) .... Take 1 tablet by mouth once a day 4)  Lansoprazole 30 Mg Cpdr (Lansoprazole) .... Take 1 tab by mouth daily 5)  Crestor 20 Mg Tabs (Rosuvastatin calcium) .... Take one tablet by mouth daily. 6)  Azelastine Hcl 137 Mcg/spray  Soln (Azelastine hcl) .... 2 sprays per nostril two times a day 7)  Tamsulosin Hcl 0.4 Mg Caps (Tamsulosin hcl) .Marland Kitchen.. 1 tab by mouth daily  Other Orders: Admin 1st Vaccine (29562) Flu Vaccine 41yrs + (13086)  Hypertension Assessment/Plan:      The patient's hypertensive risk group is category C: Target organ damage and/or diabetes.  Today's blood pressure is 110/80.  His blood pressure goal is < 140/90.  Patient Instructions: 1)  Increase exercise. 2)  Decrease shellfish, shrimp, cheese, butter..replace with healthy fats like olive oil/canola oil/benecol 3)  Fasting lipids, CMEt Dx 272.0 in 6 MONTHS. 4)  Please schedule a follow-up appointment  in 1 year.  5)  Start flomax for urinary symtpoms. 6)  Call : if not  improving. Prescriptions: TAMSULOSIN HCL 0.4 MG CAPS (TAMSULOSIN HCL) 1 tab by mouth daily  #30 x 11   Entered and Authorized by:   Kerby Nora MD   Signed by:   Kerby Nora MD on 12/17/2009   Method used:   [facility]  [address] [phone][facility] (retail[facility]       [address]       [place], [place]  [address]       Ph: [phone]       Fax: [phone]   RxID:   [id number] AZELASTINE HCL 137 MCG/SPRAY SOLN (AZELASTINE HCL) 2 sprays per nostril two times a day  #1 x 3   Entered and Authorized by:   Kerby Nora MD   Signed by:   Kerby Nora MD on 12/17/2009   Method used:   [facility]  [address] [phone][facility] (retail)       [address]       [place], [place]  [address]       Ph: [phone]       Fax: [phone]   RxID:   [id number]    Orders Added: 1)  Admin 1st Vaccine [90471] 2)  Flu Vaccine 100yrs + [09323] 3)  Est. Patient 40-64 years [55732]    Current Allergies (reviewed today): No known allergies                     Flu Vaccine Consent Questions     Do you have a history of severe allergic reactions to this vaccine? no    Any prior history of  allergic reactions to egg and/or gelatin? no    Do you have a sensitivity to the preservative Thimersol? no    Do you have a past history of Guillan-Barre Syndrome? no    Do you currently have an acute febrile illness? no    Have you ever had a severe reaction to latex? no    Vaccine information given and explained to patient? yes    Are you currently pregnant? no    Lot Number:[id number]   Exp Date:08/22/2010   Site Given  Left Deltoid [email or web address]   Last Flu Vaccine:  Fluvax 3+ (12/18/2008 12:36:40 PM) Flu Vaccine Result Date:  12/17/2009 Flu Vaccine Result:  given Flu Vaccine Next Due:  1 yr Flex Sig Next Due:  Not Indicated Hemoccult Next Due:  Not Indicated Last PSA Result:  1.62 (12/12/2009 8:44:54 AM) PSA Next Due:  1 yr

## 2010-04-23 ENCOUNTER — Other Ambulatory Visit: Payer: Self-pay | Admitting: Dermatology

## 2010-07-07 NOTE — Cardiovascular Report (Signed)
NAME:  Brett Lowery, Brett Lowery NO.:  [id number]   MEDICAL RECORD NO.:  [id number]          PATIENT TYPE:  OBV   LOCATION:  2002                         FACILITY:  [facility]   PHYSICIAN:  Veverly Fells. Excell Seltzer, MD  DATE OF BIRTH:  [birth year]   DATE OF PROCEDURE:  04/18/2007  DATE OF DISCHARGE:                            CARDIAC CATHETERIZATION   PROCEDURE:  Left heart catheterization, selective coronary angiography,  left ventricular angiography, Star close of the right femoral artery.   INDICATIONS:  Brett Lowery is a 55 year old gentleman with multiple  cardiac risk factors including hypertension, dyslipidemia, tobacco and  family history.  He presented with ongoing chest pressure.  He is ruled  out for myocardial infarction and has an unremarkable EKG.  In spite of  no objective markers for ischemia, he has multiple risk factors and a  concerning history.  He was referred for cardiac cath.   Risks and indications of procedure were reviewed with the patient.  Informed consent was obtained.  The right groin prepped, draped,  anesthetized 1% lidocaine using modified Seldinger technique.  A 6-  French sheath was placed in the right femoral artery.  Standard 6-French  catheters were used for coronary angiography and left ventriculography.  A pullback across the aortic valve was done at completion of procedure,  Star close device was used to close the femoral arteriotomy.   FINDINGS:  Aortic pressure 131/83 with a mean of 104, left ventricular  pressure 132/20.   CORONARY ANGIOGRAPHY:  Left mainstem is angiographically normal, it  bifurcates into the LAD and left circumflex.   The LAD is large-caliber vessel that courses down and reaches the distal  anterior wall.  It does not quite reach the LV apex.  It supplies two  diagonal branches both of which are relatively large.  The second  diagonal branch has minor proximal stenosis.  The first diagonal is  angiographically normal.   The LAD at the origin of first diagonal in the  proximal portion of the vessel has a 30% stenosis.  The mid-LAD after  the second diagonal has a long segment of 50% stenosis.  It has the  appearance of an intramyocardial segment of LAD.  The remaining portions  of the LAD have no significant angiographic stenosis.   Left circumflex is a large vessel.  It courses down and supplies two  very small first and second OM branches followed by a large third OM and  a large left posterolateral branch.  The left circumflex has no  significant angiographic stenosis throughout its course.   Right coronary artery is codominant with the circumflex.  It supplies a  right PDA branch.  In the mid vessel at the origin of the RV marginal  branch, there is a 20-30% stenosis.  The remaining portions of the right  coronary artery are angiographically normal.   Left ventricular function is normal.  The LVEF is 65%.   ASSESSMENT:  1. Nonobstructive LAD stenosis, suspect intramyocardial segment in the      mid-LAD.  2. Normal left circumflex.  3. Minimal RCA stenosis.  4.  Normal LV function.   PLAN:  Recommend medical therapy for the patient's nonobstructive CAD  with aspirin 81 mg daily and statin therapy with a goal LDL less than  100 and ideally less than 70.      Veverly Fells. Excell Seltzer, MD  Electronically Signed     MDC/MEDQ  D:  04/18/2007  T:  04/19/2007  Job:  [id number]

## 2010-07-07 NOTE — H&P (Signed)
NAME:  Brett Lowery, Brett Lowery NO.:  [id number]   MEDICAL RECORD NO.:  [id number]          PATIENT TYPE:  EMS   LOCATION:  MAJO                         FACILITY:  [facility]   PHYSICIAN:  Hollice Espy, M.D.DATE OF BIRTH:  12/12/1955   DATE OF ADMISSION:  04/17/2007  DATE OF DISCHARGE:                              HISTORY & PHYSICAL   PRIMARY CARE PHYSICIAN:  Dr. Kerby Nora.   CHIEF COMPLAINT:  Chest pressure.   HISTORY OF PRESENT ILLNESS:  The patient is a 55 year old white male  with past medical history of hypertension, hyperlipidemia and  hypothyroidism, who was in previously good health, then this evening  while watching TV started noticing some left chest pressure.  It  radiated up to his neck.  He had no associated diaphoresis, nausea  without vomiting and shortness of breath.  These symptoms did not  resolve immediately and continued to persist.  He became concerned and  called paramedics.  The patient was brought to the emergency room.  He  was given aspirin and nitroglycerin en route which did little to relieve  his symptoms.  A EKG was done which showed sinus tachycardia.  His chest  x-ray was unremarkable.  The rest of the patient's labs were done.  He  was noted to have a white count of 12 with an 87% shift, but otherwise  labs including cardiac markers were unremarkable.  The patient was  started on nitroglycerin drip, which did little to relieve his chest  pain; the only thing he says that it has helped is a dose of IV  morphine.  He currently is doing well.  He complains of milder chest  pressure, about a 1/10 to 2/10 as compared to a 6 or 7 earlier.  He  complains also of mild headache, but no visual changes, dysphagia or  palpitations.  No shortness of breath.  Currently, no wheezing or  coughing, no abdominal pain, no hematuria, dysuria, constipation or  diarrhea.  No focal extremity numbness, weakness or pain.  Review of  systems is otherwise  negative.   PAST MEDICAL HISTORY:  1. Hypertension.  2. Hyperlipidemia.  3. Hypothyroidism.   MEDICATIONS:  1. Lipitor.  2. Synthroid 137 mcg.  3. Norvasc.  4. Protonix 40 mg daily.  5. Benazepril.   ALLERGIES:  He has no known drug allergies.   SOCIAL HISTORY:  He denies any tobacco, alcohol or drug use.   FAMILY HISTORY:  Noncontributory.   PHYSICAL EXAMINATION:  VITAL SIGNS ON ADMISSION:  Temperature 97.4,  heart rate 104, down to 96, blood pressure 145/74, respirations 20, O2  saturation 100% on 2 L.  GENERAL:  He is alert and oriented x3, in no apparent distress.  HEENT:  Normocephalic, atraumatic.  His mucous membranes are moist.  NECK:  He has no carotid bruits.  HEART:  Regular rate and rhythm.  S1 and S2.  LUNGS:  Clear to auscultation bilaterally.  ABDOMEN:  Soft, nontender and non-distended.  Positive bowel sounds.  EXTREMITIES:  No clubbing, cyanosis or edema.   LABORATORY AND ACCESSORY CLINICAL DATA:  EKG  shows sinus tachycardia.   Chest x-ray shows no evidence of any acute cardiopulmonary disease.   White count 12.1, H&H 14.6 and 43, MCV of 86, platelet count 276,000  with 79% shift.  D-dimer less than 0.22.  Sodium 140, potassium 3.5,  chloride 107, bicarb 24, BUN 13, creatinine 1.3, glucose 138.  CPK 87.3,  MB 1.7, troponin I less than 0.05; second set is similar.   ASSESSMENT AND PLAN:  1. Chest pain:  Check 2 more sets of enzymes plus stress test.  He has      multiple risk factors including age, hypertension and      hyperlipidemia and his chest pressure has not yet resolved with      nitroglycerin drip; on the other hand, his EKG and enzymes are      negative.  2. Hyperlipidemia.  3. Hypertension.  4. Hypothyroidism.  These issues are all stable.  Continue      medications.  5. Renal insufficiency, mild, likely secondary to hypertension.      Hollice Espy, M.D.  Electronically Signed     SKK/MEDQ  D:  04/17/2007  T:  04/17/2007   Job:  [id number]   cc:   Kerby Nora, MD

## 2010-07-07 NOTE — Discharge Summary (Signed)
NAME:  BANNER, Brett Lowery NO.:  [id number]   MEDICAL RECORD NO.:  [id number]          PATIENT TYPE:  OBV   LOCATION:  2002                         FACILITY:  [facility]   PHYSICIAN:  Raenette Rover. Felicity Coyer, MDDATE OF BIRTH:  11/25/1955   DATE OF ADMISSION:  04/17/2007  DATE OF DISCHARGE:  04/18/2007                               DISCHARGE SUMMARY   DISCHARGE DIAGNOSIS:  1. Chest pain with non-obstructive coronary artery disease, with her      cardiac cath performed this admission.  2. Hypertension.  3. Hypothyroid.  4. Gastroesophageal reflux disease.  5. Dyslipidemia.   HISTORY OF PRESENT ILLNESS:  Brett Lowery is a 55 year old male who was  admitted on April 17, 2007, secondary to chest pain.  He has a past  medical history significant for hypertension and hyperlipidemia, who  noted some left-sided chest pressure which started while watching TV on  the evening of admission.  The symptoms did not resolve and continued to  persist, and he became concerned, called paramedics.  He was admitted  for further evaluation and treatment.   PAST MEDICAL HISTORY:  1. Hypertension.  2. Hyperlipidemia.  3. Hypothyroidism.   COURSE OF HOSPITALIZATION:  Chest pain.  The patient was admitted and  underwent serial cardiac enzymes due to multiple risk factors.  Cardiology consult was requested, and it was recommended the patient  undergo cardiac cath.  Cardiac cath noted non-obstructive coronary  artery disease, and it was recommended that the patient be continued on  medical management, including aspirin and Statin.   MEDICATIONS:  At time of discharge:  1. Aspirin 81 mg p.o. daily.  2. Lopressor 25-mg tabs, 1/2 tablet p.o. b.i.d.  3. Lipitor 10 mg p.o. daily.  4. Synthroid 137 mcg p.o. daily.  5. Wellbutrin XL 300 mg p.o. daily.  6. Protonix 40 mg p.o. daily.  7. Norvasc 10 mg p.o. daily.   PERTINENT LABORATORY DATA:  At time of discharge:  Hemoglobin 13,  hematocrit 38.7,  BUN 10, creatinine 1.11.   DISPOSITION:  The patient was discharged to home.   FOLLOWUP:  The patient instructed follow up with Dr. Kerby Nora in one  to two weeks and contact the office for an appointment.      Sandford Craze, NP      Raenette Rover. Felicity Coyer, MD  Electronically Signed    MO/MEDQ  D:  05/05/2007  T:  05/06/2007  Job:  [id number]   cc:   Kerby Nora, MD

## 2010-07-07 NOTE — Consult Note (Signed)
NAME:  Brett Lowery, Brett Lowery NO.:  [id number]   MEDICAL RECORD NO.:  [id number]          PATIENT TYPE:  INP   LOCATION:  2002                         FACILITY:  [facility]   PHYSICIAN:  Noralyn Pick. Eden Emms, MD, FACCDATE OF BIRTH:  [birth year]   DATE OF CONSULTATION:  04/17/2007  DATE OF DISCHARGE:                                 CONSULTATION   PRIMARY CARDIOLOGIST:  [facility] cardiology, being seen by Dr.  Eden Emms.   REQUESTING PHYSICIAN:  Dr. Rene Paci.   PATIENT PROFILE:  A 55 year old Caucasian male without prior history of  CAD who presents with unstable angina   PROBLEM LIST:  1. Unstable angina.  2. Hypertension.  3. Hyperlipidemia.  4. Hypothyroidism.  5. Gastroesophageal reflux disease.  6. Status post thyroidectomy 2004.  7. Status post corneal transplant bilaterally 2003.  8. History of hernia repair 2002.  9. Remote tobacco abuse.   ALLERGIES:  No known drug allergies.   HISTORY OF PRESENT ILLNESS:  A 55 year old Caucasian male without prior  cardiac history.  He does have a history hypertension and hyperlipidemia  as well as a family history of CAD with his mother being diagnosed with  CAD in her late 75s and father in his early 68s.  He was in his usual  state of health until this past Saturday when while using a sledgehammer  he noted dyspnea on exertion requiring him to stop what he is doing.  He  says normally he can work without any significant limitations, and so  this was a change for him.  Last night while watching a [place] race at  about 8:00 p.m., he had sudden onset of 6/10 substernal chest tightness  along with dyspnea, flushing and diaphoresis of the face.  This lasted  about four hours prior to him to call EMS.  Symptoms were not worsened  with movement or reproducible with palpation or deep breathing.  Following EMS arrival, he was given nitroglycerin which helped reduce  his discomfort but not completely relieve it.  Upon arrival to  the ED,  he was in sinus tachycardia with rates in the 120s, and he was given  additional nitroglycerin and morphine with significant reduction of  discomfort.  He was able asleep some last night but says this morning he  has about a 2/10 substernal chest tightness.  He is no longer short of  breath.  His cardiac markers to this point are negative, and ECG shows  no acute ST-T changes.   ALLERGIES:  No known drug allergies.   CURRENT MEDICATIONS:  1. Lipitor 10 mg daily.  2. Synthroid 137 mcg daily.  3. Protonix 40 mg daily.  4. Lotrel 10/20 daily.  5. Wellbutrin 300 mg daily.  6. Aspirin 325 daily.  7. IV nitroglycerin 10 mcg a minute.  8. Albuterol p.r.n.   FAMILY HISTORY:  Father is age 60 with a history of CAD diagnosed in his  early 9s.  Mother is age 57 with a history of CAD diagnosed in her late  73s.  He has a sister and two brothers; all are alive  and well.   SOCIAL HISTORY:  Lives in [place] with his wife.  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  He has three children who are alive and  well.  He has a 30 pack-year history of tobacco abuse, quitting about 5  years ago.  He denies any alcohol or drug use and is not routinely  exercising but has not previously experienced limitations in activities.   REVIEW OF SYSTEMS:  Positive chest pain, shortness of breath, dyspnea on  exertion as outlined in HPI.  He has some chronic constipation as well  as GERD-like symptoms.  He did have associated belching with his chest  discomfort.  Otherwise, all systems reviewed are negative.   PHYSICAL EXAMINATION:  Temperature 97.5, heart rate 85, respirations 18,  blood pressure 127/71, pulse oximetry 98% on room air.  Weight is 93.8  kg.  Pleasant white male in no acute distress, awake, alert and oriented  x3.  HEENT:  Is normal.  NEUROLOGICAL:  Is grossly intact, nonfocal.  SKIN:  Is warm and dry without lesions or masses.  NECK:  He has got a right bruit without JVD.   LUNGS:  Respirations regular and unlabored.  Clear to auscultation.  CARDIAC:  Regular S1/S2.  No S3, S4, murmurs.  ABDOMEN:  Round, soft, nontender, nondistended.  Bowel sounds present  x4.  EXTREMITIES:  Warm, dry, pink.  No clubbing, cyanosis or edema.  Dorsalis pedis, posterior tibial, and femoral pulses 2+ and equal  bilaterally.  No femoral bruits are noted.   Chest x-ray shows no active disease.  EKG shows sinus tach at a rate of  128 with left axis deviation.  No acute ST-T changes.  Currently, he is  in sinus rhythm with a rate in 80s.   LABORATORY WORK:  Hemoglobin 13.7, hematocrit 40.5, WBC 11.3, platelets  250.  Sodium 140, potassium 3.5, chloride 107, BUN 13, creatinine 1.3,  glucose 138.  D-dimer less than 0.22, CK 226, MB 4.3, troponin-I 0.03   ASSESSMENT AND PLAN:  1. Unstable angina.  The patient describes an episode of exertional      dyspnea that occurred on Saturday followed by chest discomfort and      tightness last night and to some extent throughout the night,      partially relieved with morphine and nitroglycerin.  He has risk      factors including hypertension, hyperlipidemia, remote tobacco      abuse, and family history.  Notably, he also has a right carotid      bruit.  Given all of this, we will plan on a left heart cardiac      catheterization to rule out obstructive coronary artery disease      tomorrow.  With his heart rate in the 80s, we will add low-dose      beta blocker and titrate as necessary.  We will also add heparin in      addition to his IV nitroglycerin which is currently running at 10      mcg per minute.  Continue statin therapy.  Aspirin added today.  2. Hypertension, stable.  Continue Lotrel.  Adding beta blocker as      above.  3. Gastroesophageal reflux disease.  Continue PPI.  4. Hyperlipidemia.  Check lipids and LFTs.  Continue statin.  5. Hypothyroidism.  TSH is pending.  Continue Synthroid.  6. Peripheral vascular disease.   The patient has a right carotid  bruit.  Will check an ultrasound today.      Nicolasa Ducking, ANP      Noralyn Pick. Eden Emms, MD, [facility]  Electronically Signed    CB/MEDQ  D:  04/17/2007  T:  04/18/2007  Job:  [id number]

## 2010-07-08 ENCOUNTER — Other Ambulatory Visit: Payer: Self-pay | Admitting: Family Medicine

## 2010-07-10 NOTE — Assessment & Plan Note (Signed)
[facility]                             [place] OFFICE NOTE   Brett Lowery, Brett Lowery                         MRN:          [id number]  DATE:12/16/2005                            DOB:          1955/08/18    CHIEF COMPLAINT:  A 55 year old white male here to establish new doctor.   HISTORY OF PRESENT ILLNESS:  Brett Lowery has previously been seen by a  primary care doctor in [facility] for high blood pressure and  cholesterol. He comes today with those concerns, as far as maintenance, as  well as the following:  1. Insomnia, new: He states he is having difficulty falling asleep for      about the past month. He also wakes up frequently. He is able to fall      asleep in the chair, but in the bed he is wide awake. He also reports      that he does snore, and he has occasionally noticed himself stopping      breathing. He denies any restless legs symptoms, or frequent urination      at night. He has stopped tea at night. He does not watch TV in bed, and      does not exercise late.  2. Hypertension, well controlled: Doing well on amlodipine and benazepril      10 mg daily, needs refill today. No side effects.  3. Hypercholesterolemia: He feels that this was only slightly elevated,      but was under better control after starting Lipitor 10 mg daily. He is      unsure when his cholesterol was last checked.   REVIEW OF SYSTEMS:  Otherwise negative. The bottom of his feet ache  bilaterally, and he would like to discuss this at his next visit.   PAST MEDICAL HISTORY:  1. Hypertension.  2. Hypercholesterolemia.  3. Hypothyroidism.  4. Gastroesophageal reflux disease.   HOSPITALIZATIONS AND SURGICAL PROCEDURES:  1. 2004, thyroid nodule, resulting in thyroidectomy.  2. 2003, bilateral eye operations for corneal transplants.  3. 2002, hernia repair.   ALLERGIES:  None.   MEDICATIONS:  1. Wellbutrin XL 300 mg daily.  2. Amlodipine benazepril  10/20 mg daily.  3. Protonix 40 mg daily.  4. Synthroid 150 mcg daily.  5. Lipitor 10 mg daily.   FAMILY HISTORY:  Father alive at age 64 and healthy. Mother is alive at age  66 with coronary artery disease resulting in a heart stent, no myocardial  infarction before age 59. He has two brothers and one sister who are  healthy. He has a paternal grandfather with diabetes and the same paternal  grandfather with prostate cancer. There is no family history of any other  type of cancer. His sister does abuse alcohol.   SOCIAL HISTORY:  He does not smoke currently, but was a former smoker with  about a 30 pack-year history. He denies alcohol use or drug abuse. He works  for a [employer]. He has been happily married for 30  years. He has three girls, ages 67  to 23. He is not getting regular exercise.  He eats fruits and vegetables, but occasionally fast food, pretty much every  weekend.   PHYSICAL EXAMINATION:  VITAL SIGNS: Height 68 inches, weight 210, body mass  index 30, blood pressure 116/80, pulse 80, temperature 98.1.  GENERAL: Overweight appearing male in apparent distress.  HEENT: Pupils equal, round, and reactive to light, extraocular muscles  intact, oral pharynx clear, tympanic membranes clear. Nares clear, no  thyromegaly, no lymphadenopathy, supraclavicular cervical.  CARDIOVASCULAR: Regular rate and rhythm, no murmurs, rubs, or gallops.  PULMONARY: Clear to auscultation bilaterally, no wheezes, rales or rhonchi.  ABDOMEN: Soft, non-tender, normal active bowel sounds, no hepatomegaly, no  splenomegaly.  MUSCULOSKELETAL: Strength 5/5 in upper and lower extremities.  NEUROLOGIC: Cranial nerves II-XII grossly intact, alert and oriented x 3,  reflexes 2+.   ASSESSMENT AND PLAN:  1. Insomnia, new: We will start with two weeks of a sleep hygiene program.      He was given information about this. He will then followup in two weeks      if he continues to have  problems. We can consider a medication. He does      mention symptoms that sound like sleep apnea, which will likely need to      be worked up in the future with a sleep study.  2. Hypertension, well controlled: He was given a refill of his amlodipine      benazepril 10 mg daily.  3. Hypercholesterolemia: I will get records from his previous doctor to      determine when this was last measured. He was given a prescription for      Lipitor 10 mg daily today.  4. Hypothyroidism, stable: We will determine when TSH was last checked.      Given a prescription for Synthroid 10 mg daily.  5. Gastroesophageal reflux disease, stable: He would like a cheaper      medication than Protonix, so I suggested Prilosec 40 mg daily.  6. Prevention: We will likely need to do a sleep study. He will need      pulmonary function test in the future, given his greater than 25 pack-      year history. We discussed flu vaccine, and he will consider it, but      did want to do it at this time. I also encouraged him to lose weight      and increase exercise, as well as to avoid fast food.     Kerby Nora, MD    AB/MedQ  DD: 12/16/2005  DT: 12/17/2005  Job #: [id number]

## 2010-07-10 NOTE — Op Note (Signed)
NAME:  Brett Lowery, Brett Lowery NO.:  [id number]   MEDICAL RECORD NO.:  [id number]                   PATIENT TYPE:  AMB   LOCATION:  DAY                                  FACILITY:  [facility]   PHYSICIAN:  Angelia Mould. Derrell Lolling, M.D.             DATE OF BIRTH:  1955-11-13   DATE OF PROCEDURE:  06/15/2002  DATE OF DISCHARGE:                                 OPERATIVE REPORT   PREOPERATIVE DIAGNOSES:  1. Multinodular goiter.  2. Subclinical hyperthyroidism.   POSTOPERATIVE DIAGNOSES:  1. Multinodular goiter.  2. Subclinical hyperthyroidism.   OPERATION PERFORMED:  Total thyroidectomy.   SURGEON:  Angelia Mould. Derrell Lolling, M.D.   FIRST ASSISTANT:  Velora Heckler, M.D.   OPERATIVE INDICATIONS:  This is a 55 year old white man, who underwent  recent medical evaluation for borderline hypertension, insomnia, and  fatigue.  Thyroid function tests showed a suppressed TSH level of 0.083 but  normal T4 and T3 uptake.  He had a thyroid ultrasound which showed a 2.2 cm,  calcified mass in the right lobe and a 1.8 cm complex, partially cystic,  partially solid mass in the left lobe.  Both of these were cold nodules on  thyroid scan.  A 24 hour thyroid uptake was felt to be normal.  Fine needle  aspiration cytology of the calcified nodule on the right showed abundant  colloid and scant epithelium.  Fine needle aspiration cytology of the mass  on the left showed colloid and follicular epithelium and macrophages.  He  was evaluated.  I felt that his thyroid gland was minimally enlarged.  I  told him that this was most likely a multinodular goiter, but there was  probably a much as a 10% risk of cancer.  He saw Saddie Benders, M.D. at [facility], who felt that he had subclinical hyperthyroidism and  multinodular goiter.  He advised him to go ahead and have thyroid surgery as  the most simple straightforward solution to his thyroid problems.  This was  discussed with the  patient, and he is brought to the operating room  electively.   OPERATIVE TECHNIQUE:  Following the induction of general endotracheal  anesthesia, the patient was positioned in a slightly head-up position with a  roll behind his shoulder, his arms at his sides, and the chin extended.  The  neck was prepped and draped in a sterile fashion.  A transverse incision was  made in Langer's lines, approximately 2 cm above the suprasternal notch.  Dissection was carried down through the subcutaneous tissue.  We divided the  platysma muscle.  Skin and platysma flaps were elevated superiorly to the  thyroid notch and inferiorly to just above the suprasternal notch.  There  were numerous veins present, and we had some bleeding which was controlled  with cautery and suture ligatures.  Strap muscles were divided in the  midline.  We  placed a self-retaining retractor.  We dissected the strap  muscles off of the right lobe and the left lobe of the thyroid.   We found that the thyroid gland itself contained about a 1.5 cm cystic mass  in the left lobe and about a 2.5 cm, rock-hard, smoothly-marginated mass in  the right lobe inferiorly and posteriorly.  We found that the gland itself  was extremely fibrotic, and it took a great deal of time to dissect it up  out of the thyroid bed on each side because of the fibrosis of the tissue.  We kept our dissection on both sides right on the thyroid capsule.  On each  side, I took down the superior pole initially, isolating the superior pole  vessels and suture ligating with 2-0 silk ties and a metal clip on the  cephalad extent.  This freed up the upper poles on both sides.  The lower  poles were freed up by blunt dissection, and there were a few venous  channels in the midline which were isolated and controlled with metal clips  and divided.   On the left side, we slowly dissected the thyroid gland off of its bed.  We  kept the dissection right on the thyroid  capsule, preserving parathyroid  glands on the left superior, and right superior and right inferior were  seen.  I never did see the left inferior.  I felt that we preserved the  parathyroid gland since we stayed right on the capsule of the thyroid.  Dissection was carried up off of the trachea.  We identified the region of  the recurrent laryngeal nerves on both sides and although we did not  specifically dissect them out to search for them since the dissection was  carried out so close to the thyroid gland, I do not feel that they were  injured.  Dissection on the right side was similar but a little more slow  and tedious because of the large, rock-hard mass in the inferior pole.  Nevertheless, we were able to carry the dissection up and see well what we  did and then dissected the thyroid and its isthmus off of the trachea.  The  right superior pole of the thyroid gland was marked with a suture and the  specimen sent to pathology.   The operative field was copiously irrigated with saline.  Hemostasis was  excellent and had been achieved with metal clips and Surgicel gauze.  We  observed the wound for about 10 minutes, and there was no bleeding  whatsoever.  We felt comfortable closing, and we did close the strap muscles  with interrupted sutures of 3-0 Vicryl.  The platysma muscle was closed with  interrupted sutures of 3-0 Vicryl and the skin closed with a few skin  staples and Steri-Strips.  Clean bandages were placed and the patient taken  to the recovery room in stable condition.  Estimated blood loss was about 40-  50 mL.  Complications none.  Sponge, needle, and instrument counts were  correct.                                               Angelia Mould. Derrell Lolling, M.D.    HMI/MEDQ  D:  06/15/2002  T:  06/15/2002  Job:  [id number]   cc:   Clydie Braun L. Hal Hope, M.D.  [phone]  [address]  [place]  [place] [address]  Fax: [phone]  K. Saddie Benders, Prof. Int. Med.  Endocrinology  Dept.  [facility]  [address]  [place], [place] [address]

## 2010-08-27 ENCOUNTER — Encounter: Payer: Self-pay | Admitting: Cardiovascular Disease

## 2010-09-08 ENCOUNTER — Other Ambulatory Visit: Payer: Self-pay | Admitting: Family Medicine

## 2010-10-10 ENCOUNTER — Other Ambulatory Visit: Payer: Self-pay | Admitting: Family Medicine

## 2010-10-27 ENCOUNTER — Emergency Department (HOSPITAL_COMMUNITY): Payer: Worker's Compensation

## 2010-10-27 ENCOUNTER — Emergency Department (HOSPITAL_COMMUNITY)
Admission: EM | Admit: 2010-10-27 | Discharge: 2010-10-27 | Disposition: A | Discharge status: Home / Self Care | Payer: Worker's Compensation | Attending: Emergency Medicine | Admitting: Emergency Medicine

## 2010-10-27 DIAGNOSIS — M545 Low back pain, unspecified: Secondary | ICD-10-CM | POA: Insufficient documentation

## 2010-10-27 DIAGNOSIS — E7800 Pure hypercholesterolemia, unspecified: Secondary | ICD-10-CM | POA: Insufficient documentation

## 2010-10-27 DIAGNOSIS — S63509A Unspecified sprain of unspecified wrist, initial encounter: Secondary | ICD-10-CM | POA: Insufficient documentation

## 2010-10-27 DIAGNOSIS — IMO0002 Reserved for concepts with insufficient information to code with codable children: Secondary | ICD-10-CM | POA: Insufficient documentation

## 2010-10-27 DIAGNOSIS — I10 Essential (primary) hypertension: Secondary | ICD-10-CM | POA: Insufficient documentation

## 2010-10-27 DIAGNOSIS — W19XXXA Unspecified fall, initial encounter: Secondary | ICD-10-CM | POA: Insufficient documentation

## 2010-10-27 DIAGNOSIS — K219 Gastro-esophageal reflux disease without esophagitis: Secondary | ICD-10-CM | POA: Insufficient documentation

## 2010-10-27 DIAGNOSIS — Y9289 Other specified places as the place of occurrence of the external cause: Secondary | ICD-10-CM | POA: Insufficient documentation

## 2010-11-16 LAB — I-STAT 8, (EC8 V) (CONVERTED LAB)
BUN: 13
Bicarbonate: 24
Chloride: 107
Glucose, Bld: 138 — ABNORMAL HIGH
HCT: 44
Hemoglobin: 15
Operator id: 272551
Sodium: 140

## 2010-11-16 LAB — CBC
HCT: 38.7 — ABNORMAL LOW
HCT: 40.5
Hemoglobin: 13.7
MCHC: 34
MCV: 85.9
Platelets: 228
RBC: 4.72
RDW: 13.6
RDW: 13.8
WBC: 11.3 — ABNORMAL HIGH

## 2010-11-16 LAB — BASIC METABOLIC PANEL
BUN: 10
Creatinine, Ser: 1.11
GFR calc non Af Amer: >60
Glucose, Bld: 107 — ABNORMAL HIGH
Potassium: 3.9

## 2010-11-16 LAB — DIFFERENTIAL
Basophils Absolute: 0
Basophils Relative: 0
Eosinophils Absolute: 0.3
Monocytes Absolute: 0.7
Monocytes Relative: 6
Neutro Abs: 9.6 — ABNORMAL HIGH
Neutrophils Relative %: 79 — ABNORMAL HIGH

## 2010-11-16 LAB — POCT I-STAT CREATININE
Creatinine, Ser: 1.3
Operator id: 272551

## 2010-11-16 LAB — CARDIAC PANEL(CRET KIN+CKTOT+MB+TROPI)
CK, MB: 2.7
CK, MB: 4.3 — ABNORMAL HIGH
Relative Index: 1.3
Relative Index: 1.9
Total CK: 226

## 2010-11-16 LAB — POCT CARDIAC MARKERS
Myoglobin, poc: 87.3
Operator id: 272551
Operator id: 277751

## 2010-11-16 LAB — TSH: TSH: 2.495

## 2010-11-16 LAB — LIPID PANEL
HDL: 37 — ABNORMAL LOW
Total CHOL/HDL Ratio: 4.1

## 2010-11-16 LAB — PROTIME-INR: INR: 1

## 2010-11-16 LAB — HEMOGLOBIN A1C
Hgb A1c MFr Bld: 6.2 — ABNORMAL HIGH
Mean Plasma Glucose: 143

## 2010-11-16 LAB — HEPARIN LEVEL (UNFRACTIONATED): Heparin Unfractionated: 0.65

## 2010-12-30 ENCOUNTER — Other Ambulatory Visit: Payer: Self-pay | Admitting: Family Medicine

## 2011-02-02 ENCOUNTER — Other Ambulatory Visit: Payer: Self-pay | Admitting: Family Medicine

## 2011-03-04 ENCOUNTER — Telehealth: Payer: Self-pay | Admitting: Family Medicine

## 2011-03-04 DIAGNOSIS — E039 Hypothyroidism, unspecified: Secondary | ICD-10-CM

## 2011-03-04 DIAGNOSIS — E119 Type 2 diabetes mellitus without complications: Secondary | ICD-10-CM

## 2011-03-04 DIAGNOSIS — Z125 Encounter for screening for malignant neoplasm of prostate: Secondary | ICD-10-CM

## 2011-03-04 NOTE — Telephone Encounter (Signed)
Message copied by Excell Seltzer on Thu Mar 04, 2011  4:10 PM ------      Message from: Alvina Chou      Created: Tue Mar 02, 2011  3:53 PM      Regarding: lab orders for 03-09-11       Patient is scheduled for CPX labs, please order future labs, Thanks , Camelia Eng

## 2011-03-09 ENCOUNTER — Other Ambulatory Visit (INDEPENDENT_AMBULATORY_CARE_PROVIDER_SITE_OTHER): Payer: PRIVATE HEALTH INSURANCE

## 2011-03-09 ENCOUNTER — Ambulatory Visit (INDEPENDENT_AMBULATORY_CARE_PROVIDER_SITE_OTHER): Payer: PRIVATE HEALTH INSURANCE | Admitting: *Deleted

## 2011-03-09 DIAGNOSIS — Z125 Encounter for screening for malignant neoplasm of prostate: Secondary | ICD-10-CM

## 2011-03-09 DIAGNOSIS — Z23 Encounter for immunization: Secondary | ICD-10-CM

## 2011-03-09 DIAGNOSIS — E119 Type 2 diabetes mellitus without complications: Secondary | ICD-10-CM

## 2011-03-09 DIAGNOSIS — E039 Hypothyroidism, unspecified: Secondary | ICD-10-CM

## 2011-03-09 LAB — COMPREHENSIVE METABOLIC PANEL
ALT: 20 U/L (ref 0–53)
Alkaline Phosphatase: 69 U/L (ref 39–117)
CO2: 29 mEq/L (ref 19–32)
Potassium: 4 mEq/L (ref 3.5–5.1)
Sodium: 143 mEq/L (ref 135–145)
Total Bilirubin: 1 mg/dL (ref 0.3–1.2)
Total Protein: 6.7 g/dL (ref 6.0–8.3)

## 2011-03-09 LAB — LIPID PANEL
HDL: 50.5 mg/dL (ref >39.00)
Total CHOL/HDL Ratio: 3
VLDL: 16.6 mg/dL (ref 0.0–40.0)

## 2011-03-09 LAB — PSA: PSA: 1.31 ng/mL (ref 0.10–4.00)

## 2011-03-09 LAB — HEMOGLOBIN A1C: Hgb A1c MFr Bld: 6 % (ref 4.6–6.5)

## 2011-03-11 ENCOUNTER — Other Ambulatory Visit: Payer: Self-pay | Admitting: Family Medicine

## 2011-03-15 ENCOUNTER — Encounter: Payer: Self-pay | Admitting: Family Medicine

## 2011-03-15 ENCOUNTER — Ambulatory Visit (INDEPENDENT_AMBULATORY_CARE_PROVIDER_SITE_OTHER): Payer: PRIVATE HEALTH INSURANCE | Admitting: Family Medicine

## 2011-03-15 DIAGNOSIS — E119 Type 2 diabetes mellitus without complications: Secondary | ICD-10-CM

## 2011-03-15 DIAGNOSIS — Z0000 Encounter for general adult medical examination without abnormal findings: Secondary | ICD-10-CM

## 2011-03-15 DIAGNOSIS — I10 Essential (primary) hypertension: Secondary | ICD-10-CM

## 2011-03-15 DIAGNOSIS — E039 Hypothyroidism, unspecified: Secondary | ICD-10-CM

## 2011-03-15 DIAGNOSIS — E7800 Pure hypercholesterolemia, unspecified: Secondary | ICD-10-CM

## 2011-03-15 DIAGNOSIS — I2510 Atherosclerotic heart disease of native coronary artery without angina pectoris: Secondary | ICD-10-CM

## 2011-03-15 NOTE — Assessment & Plan Note (Signed)
Well controlled on diet 

## 2011-03-15 NOTE — Assessment & Plan Note (Signed)
Borderline control off amlodipine. Foollow closely at home, call with measurements to determine if we need to restart med.

## 2011-03-15 NOTE — Patient Instructions (Addendum)
Follow BP daily at home in next 1-2 weeks.. Goal BP <130/80. Call us with measurements to see if we need to restart amlodipine. Get back on track with increasing exercise, lose weight and low cholesterol diet. Call insurance to see if TDAP tetanus covered.. Let us know if you are interested. Re-check cholesterol in 3 months. Follow up appt in 6 months for DM recheck with fasting  Labs prior as well.

## 2011-03-15 NOTE — Progress Notes (Signed)
Subjective:    Patient ID: Brett Lowery, male    DOB: 17-Feb-1956, 56 y.o.   MRN: [id number]  HPI  The patient is here for annual wellness exam and preventative care.    Hypothyroid: Stable control on synthroid daily. Lab Results  Component Value Date   TSH 0.47 03/09/2011   Hypertension:  Borderline control today in office on no medication.  Was on amlodipine in past. Has injured right arm..rotator cuff surgery, and bicep surgery. Having some pain with this. Also on decongestants. Using medication without problems or lightheadedness:  Chest pain with exertion:None Edema:None Short of breath:None Average home BPs:not checking at home. Other issues:  Elevated Cholesterol:  Worsened control in last year. Not at goal LDL <70 on crestor 20 mg daily. Using medications without problems: Muscle aches:  Diet compliance:Moderate. Worse over the holidays. Has gained 20 lbs since Christmas. Exercise:Minimal in last 6 weeks, does walk some. Other complaints:CAD  Diabetes:  Well controlled on diet. Lab Results  Component Value Date   HGBA1C 6.0 03/09/2011  :Hypoglycemic episodes: Hyperglycemic episodes: Feet problems: Blood Sugars averaging: eye exam within last year:  BPH improved on flomax. Gets up once at night now, much improved. Stable PSA.   Review of Systems  Constitutional: Negative for fever, fatigue and unexpected weight change.  HENT: Negative for ear pain, congestion, sore throat, rhinorrhea, trouble swallowing and postnasal drip.   Eyes: Negative for pain.  Respiratory: Negative for cough, shortness of breath and wheezing.   Cardiovascular: Negative for chest pain, palpitations and leg swelling.  Gastrointestinal: Negative for nausea, abdominal pain, diarrhea, constipation and blood in stool.  Genitourinary: Negative for dysuria, urgency, hematuria, discharge, penile swelling, scrotal swelling, difficulty urinating, penile pain and testicular pain.  Skin:  Negative for rash.  Neurological: Negative for syncope, weakness, light-headedness, numbness and headaches.  Psychiatric/Behavioral: Negative for behavioral problems and dysphoric mood. The patient is not nervous/anxious.        Objective:   Physical Exam  Constitutional: He appears well-developed and well-nourished.  Non-toxic appearance. He does not appear ill. No distress.  HENT:  Head: Normocephalic and atraumatic.  Right Ear: Hearing, tympanic membrane, external ear and ear canal normal.  Left Ear: Hearing, tympanic membrane, external ear and ear canal normal.  Nose: Nose normal.  Mouth/Throat: Uvula is midline, oropharynx is clear and moist and mucous membranes are normal.  Eyes: Conjunctivae, EOM and lids are normal. Pupils are equal, round, and reactive to light. No foreign bodies found.  Neck: Trachea normal, normal range of motion and phonation normal. Neck supple. Carotid bruit is not present. No mass and no thyromegaly present.  Cardiovascular: Normal rate, regular rhythm, S1 normal, S2 normal, intact distal pulses and normal pulses.  Exam reveals no gallop.   No murmur heard. Pulmonary/Chest: Breath sounds normal. He has no wheezes. He has no rhonchi. He has no rales.  Abdominal: Soft. Normal appearance and bowel sounds are normal. There is no hepatosplenomegaly. There is no tenderness. There is no rebound, no guarding and no CVA tenderness. No hernia. Hernia confirmed negative in the right inguinal area and confirmed negative in the left inguinal area.  Genitourinary: Testes normal and penis normal. Rectal exam shows no external hemorrhoid, no internal hemorrhoid, no fissure, no mass, no tenderness and anal tone normal. Guaiac negative stool. Prostate is enlarged. Prostate is not tender. Right testis shows no mass and no tenderness. Left testis shows no mass and no tenderness. No paraphimosis or penile tenderness.  No prostate nodules  Lymphadenopathy:    He has no cervical  adenopathy.       Right: No inguinal adenopathy present.       Left: No inguinal adenopathy present.  Neurological: He is alert. He has normal strength and normal reflexes. No cranial nerve deficit or sensory deficit. Gait normal.  Skin: Skin is warm, dry and intact. No rash noted.  Psychiatric: He has a normal mood and affect. His speech is normal and behavior is normal. Judgment normal.     Diabetic foot exam: Normal inspection No skin breakdown No calluses  Normal DP pulses Normal sensation to light touch and monofilament Nails normal      Assessment & Plan:  Complete Physical Exam: The patient's preventative maintenance and recommended screening tests for an annual wellness exam were reviewed in full today. Brought up to date unless services declined.  Counselled on the importance of diet, exercise, and its role in overall health and mortality. The patient's FH and SH was reviewed, including their home life, tobacco status, and drug and alcohol status.   Vaccines:Due for Td but refused, uptodate with flu. Colon:2009, adenoma.. Likely repeat ? 5 years, not stated by Dr. Russella Dar Prostate: Stable PSA, rectal exam today.

## 2011-03-15 NOTE — Assessment & Plan Note (Signed)
Stale control on current med.

## 2011-03-15 NOTE — Assessment & Plan Note (Signed)
Worse control since last check. Get back on track with lifestyle. Continue crestor.

## 2011-05-15 ENCOUNTER — Other Ambulatory Visit: Payer: Self-pay | Admitting: Family Medicine

## 2011-06-07 ENCOUNTER — Other Ambulatory Visit: Payer: Self-pay | Admitting: *Deleted

## 2011-06-07 MED ORDER — ROSUVASTATIN CALCIUM 20 MG PO TABS: 20.0000 mg | ORAL_TABLET | Freq: Every day | ORAL | Status: DC

## 2011-06-21 ENCOUNTER — Other Ambulatory Visit (INDEPENDENT_AMBULATORY_CARE_PROVIDER_SITE_OTHER): Payer: PRIVATE HEALTH INSURANCE

## 2011-06-21 DIAGNOSIS — E7800 Pure hypercholesterolemia, unspecified: Secondary | ICD-10-CM

## 2011-06-21 LAB — LIPID PANEL
Cholesterol: 145 mg/dL (ref 0–200)
LDL Cholesterol: 74 mg/dL (ref 0–99)
Total CHOL/HDL Ratio: 3
VLDL: 20.4 mg/dL (ref 0.0–40.0)

## 2011-07-05 ENCOUNTER — Other Ambulatory Visit: Payer: Self-pay | Admitting: *Deleted

## 2011-07-05 MED ORDER — LANSOPRAZOLE 30 MG PO CPDR: 30.0000 mg | DELAYED_RELEASE_CAPSULE | Freq: Every day | ORAL | Status: DC

## 2011-07-20 ENCOUNTER — Other Ambulatory Visit: Payer: Self-pay | Admitting: Family Medicine

## 2011-08-04 ENCOUNTER — Other Ambulatory Visit: Payer: Self-pay | Admitting: *Deleted

## 2011-08-04 MED ORDER — LEVOTHYROXINE SODIUM 137 MCG PO TABS: 137.0000 ug | ORAL_TABLET | Freq: Every day | ORAL | Status: DC

## 2011-09-06 ENCOUNTER — Telehealth: Payer: Self-pay | Admitting: Family Medicine

## 2011-09-06 NOTE — Telephone Encounter (Signed)
Pt is going to be out of meds and needs refill. Pt is not sure which meds are going to run out ??

## 2011-09-07 ENCOUNTER — Other Ambulatory Visit: Payer: Self-pay

## 2011-09-07 ENCOUNTER — Telehealth: Payer: Self-pay | Admitting: Family Medicine

## 2011-09-07 DIAGNOSIS — E7800 Pure hypercholesterolemia, unspecified: Secondary | ICD-10-CM

## 2011-09-07 DIAGNOSIS — E039 Hypothyroidism, unspecified: Secondary | ICD-10-CM

## 2011-09-07 DIAGNOSIS — E119 Type 2 diabetes mellitus without complications: Secondary | ICD-10-CM

## 2011-09-07 MED ORDER — LEVOTHYROXINE SODIUM 137 MCG PO TABS: 137.0000 ug | ORAL_TABLET | Freq: Every day | ORAL | Status: DC

## 2011-09-07 MED ORDER — TAMSULOSIN HCL 0.4 MG PO CAPS: 0.4000 mg | ORAL_CAPSULE | Freq: Every day | ORAL | Status: DC

## 2011-09-07 MED ORDER — LANSOPRAZOLE 30 MG PO CPDR: 30.0000 mg | DELAYED_RELEASE_CAPSULE | Freq: Every day | ORAL | Status: DC

## 2011-09-07 MED ORDER — ROSUVASTATIN CALCIUM 20 MG PO TABS: 20.0000 mg | ORAL_TABLET | Freq: Every day | ORAL | Status: DC

## 2011-09-07 NOTE — Telephone Encounter (Signed)
Message copied by Excell Seltzer on Tue Sep 07, 2011  2:34 PM ------      Message from: Alvina Chou      Created: Wed Sep 01, 2011  3:37 PM      Regarding: Lab orders for Tues,7-16.13       Patient is scheduled for CPX labs, please order future labs, Thanks , Camelia Eng

## 2011-09-07 NOTE — Telephone Encounter (Signed)
Pt seen in January.Molli Knock to refill all meds for 6 months.

## 2011-09-14 ENCOUNTER — Encounter: Payer: Self-pay | Admitting: Family Medicine

## 2011-09-17 ENCOUNTER — Telehealth: Payer: Self-pay | Admitting: Family Medicine

## 2011-09-17 ENCOUNTER — Encounter: Payer: Self-pay | Admitting: Family Medicine

## 2011-09-17 ENCOUNTER — Ambulatory Visit (INDEPENDENT_AMBULATORY_CARE_PROVIDER_SITE_OTHER)
Admission: RE | Admit: 2011-09-17 | Discharge: 2011-09-17 | Disposition: A | Discharge status: Home / Self Care | Payer: PRIVATE HEALTH INSURANCE | Source: Ambulatory Visit | Attending: Family Medicine | Admitting: Family Medicine

## 2011-09-17 ENCOUNTER — Other Ambulatory Visit (INDEPENDENT_AMBULATORY_CARE_PROVIDER_SITE_OTHER): Payer: PRIVATE HEALTH INSURANCE

## 2011-09-17 ENCOUNTER — Ambulatory Visit (INDEPENDENT_AMBULATORY_CARE_PROVIDER_SITE_OTHER): Payer: PRIVATE HEALTH INSURANCE | Admitting: Family Medicine

## 2011-09-17 VITALS — BP 128/76 | HR 68 | Temp 98.3°F | Ht 68.0 in | Wt 185.8 lb

## 2011-09-17 DIAGNOSIS — R1032 Left lower quadrant pain: Secondary | ICD-10-CM

## 2011-09-17 DIAGNOSIS — G4700 Insomnia, unspecified: Secondary | ICD-10-CM

## 2011-09-17 DIAGNOSIS — R911 Solitary pulmonary nodule: Secondary | ICD-10-CM

## 2011-09-17 LAB — COMPREHENSIVE METABOLIC PANEL
Alkaline Phosphatase: 67 U/L (ref 39–117)
Creatinine, Ser: 0.9 mg/dL (ref 0.4–1.5)
Glucose, Bld: 104 mg/dL — ABNORMAL HIGH (ref 70–99)
Sodium: 140 mEq/L (ref 135–145)
Total Bilirubin: 1 mg/dL (ref 0.3–1.2)
Total Protein: 7.3 g/dL (ref 6.0–8.3)

## 2011-09-17 LAB — CBC WITH DIFFERENTIAL/PLATELET
Basophils Relative: 0.4 % (ref 0.0–3.0)
Eosinophils Absolute: 0.2 10*3/uL (ref 0.0–0.7)
Eosinophils Relative: 2.1 % (ref 0.0–5.0)
HCT: 47.5 % (ref 39.0–52.0)
Hemoglobin: 15.8 g/dL (ref 13.0–17.0)
Lymphocytes Relative: 23.8 % (ref 12.0–46.0)
Monocytes Absolute: 0.6 10*3/uL (ref 0.1–1.0)
Monocytes Relative: 7.8 % (ref 3.0–12.0)
RBC: 5.34 Mil/uL (ref 4.22–5.81)
RDW: 13.7 % (ref 11.5–14.6)
WBC: 7.9 10*3/uL (ref 4.5–10.5)

## 2011-09-17 LAB — TSH: TSH: 0.19 u[IU]/mL — ABNORMAL LOW (ref 0.35–5.50)

## 2011-09-17 LAB — POCT URINALYSIS DIPSTICK
Glucose, UA: NEGATIVE
Leukocytes, UA: NEGATIVE
Nitrite, UA: NEGATIVE
Urobilinogen, UA: 0.2

## 2011-09-17 LAB — T4, FREE: Free T4: 1.22 ng/dL (ref 0.60–1.60)

## 2011-09-17 MED ORDER — IOHEXOL 300 MG/ML  SOLN
100.0000 mL | Freq: Once | INTRAMUSCULAR | Status: AC | PRN
  Administered 2011-09-17: 100 mL via INTRAVENOUS

## 2011-09-17 MED ORDER — TRAZODONE HCL 50 MG PO TABS: 25.0000 mg | ORAL_TABLET | Freq: Every evening | ORAL | Status: DC | PRN

## 2011-09-17 NOTE — Patient Instructions (Signed)
Trial of trazodone for sleep. Sleep hygiene and stress reduction.  We will call with lab results.  Stop at front desk to set up imaging study.

## 2011-09-17 NOTE — Assessment & Plan Note (Signed)
Reviewed sleep hygeine and retraining.  Ttrial of trazodone for sleep.

## 2011-09-17 NOTE — Addendum Note (Signed)
Addended by: Josph Macho A on: 09/17/2011 09:22 AM   Modules accepted: Orders

## 2011-09-17 NOTE — Telephone Encounter (Signed)
Notify pt that there is no diverticultitis, no mass. There is a small left inguinal hernia but only containing fat.. Not clearly source of pain.  Increase water and take miralax to help with constipation and to clear out stool.  Recommend evaluation of testicle with [place] if not improving with above treatment... Does not have to be done today.

## 2011-09-17 NOTE — Telephone Encounter (Signed)
Also let pt know the is a lung nodule that we will need to repeat CT chest next year to assure it is stable.

## 2011-09-17 NOTE — Progress Notes (Signed)
  Subjective:    Patient ID: Brett Lowery, male    DOB: 1955-12-14, 56 y.o.   MRN: [id number]  HPI 56year old male presents with  Left lower quadrant pain radiating to left groin. Ongoing x 1 month, improved some, then sharp pain intermittently worsened in last week. 8/10 on [pain scale. Feels constant pressure.  Pain increases with standing.  No nausea, no vomiting, no diarrhea, has history of constipation after surgery in  01/2011. Has daily BMs but straining off and on. No blood in stool.  No dysuria. No UOP.  No fever.   Has history of hernia in right groin, this does not feel the same.  Has been having insomnia in past several months. Cannot fall asleep and then cannot stay asleep. Has been under stress but denies depression, anxiety.  feels fatigued but other wise well. Nap on Sunday but has done so for year.  Drinks only decaf.  No TV in bed.   ZQuil didn't help any.   Last colonoscopy 06/2007 : polyps, diverticulosis.    Review of Systems  Constitutional: Positive for fatigue. Negative for fever.  HENT: Negative for congestion.   Eyes: Negative for pain.  Respiratory: Negative for cough and shortness of breath.   Cardiovascular: Negative for chest pain.  Gastrointestinal: Negative for blood in stool.  Genitourinary: Negative for dysuria, hematuria, flank pain, penile pain and testicular pain.       Occ pain radiates to left testicle       Objective:   Physical Exam  Constitutional: He appears well-developed and well-nourished.  Eyes: Conjunctivae are normal. Pupils are equal, round, and reactive to light.  Neck: Normal range of motion. Neck supple. No thyromegaly present.  Cardiovascular: Normal rate.   Pulmonary/Chest: Effort normal and breath sounds normal.  Abdominal: Soft. There is no hepatosplenomegaly. There is tenderness in the left lower quadrant. There is no rigidity, no rebound, no guarding and no CVA tenderness. Hernia confirmed negative in the right  inguinal area and confirmed negative in the left inguinal area.  Genitourinary: Testes normal and penis normal. Right testis shows no mass, no swelling and no tenderness. Left testis shows no mass, no swelling and no tenderness.  Lymphadenopathy:       Right: No inguinal adenopathy present.       Left: No inguinal adenopathy present.          Assessment & Plan:

## 2011-09-17 NOTE — Assessment & Plan Note (Signed)
UA clear. Will eval with labs and CT ad/pelvis.Marland Kitchen. ? Diverticulitis, lese likely constipation... No clear sign of hernia.

## 2011-09-20 ENCOUNTER — Telehealth: Payer: Self-pay | Admitting: Family Medicine

## 2011-09-20 NOTE — Telephone Encounter (Signed)
Pt is calling and states that he should have heard from office about CT results and no one ever called him; Brett Lowery from office made aware and pt conferenced to [facility] successfully

## 2011-09-20 NOTE — Telephone Encounter (Signed)
Patient advised.

## 2011-09-20 NOTE — Telephone Encounter (Signed)
Caller: Aeric/Patient; Phone Number: [phone]; Message from caller: Never heard back from anyone in regards to his cat scan.

## 2011-11-11 ENCOUNTER — Other Ambulatory Visit: Payer: Self-pay

## 2011-12-06 LAB — HM DIABETES EYE EXAM

## 2012-01-03 ENCOUNTER — Other Ambulatory Visit: Payer: Self-pay | Admitting: *Deleted

## 2012-01-03 MED ORDER — ROSUVASTATIN CALCIUM 20 MG PO TABS: ORAL_TABLET | ORAL | Status: DC

## 2012-03-01 ENCOUNTER — Telehealth: Payer: Self-pay | Admitting: Family Medicine

## 2012-03-01 DIAGNOSIS — E119 Type 2 diabetes mellitus without complications: Secondary | ICD-10-CM

## 2012-03-01 DIAGNOSIS — E785 Hyperlipidemia, unspecified: Secondary | ICD-10-CM

## 2012-03-01 DIAGNOSIS — E039 Hypothyroidism, unspecified: Secondary | ICD-10-CM

## 2012-03-01 NOTE — Telephone Encounter (Signed)
Message copied by Excell Seltzer on Wed Mar 01, 2012 11:12 PM ------      Message from: Baldomero Lamy      Created: Tue Feb 29, 2012  1:09 PM      Regarding: Cpx labs Thurs 1/9       Please order  future cpx labs for pt's upcoming lab appt. Pt already has an old A1c and Micro/albumin order in epic.      Thanks      Rodney Booze

## 2012-03-02 ENCOUNTER — Other Ambulatory Visit (INDEPENDENT_AMBULATORY_CARE_PROVIDER_SITE_OTHER): Payer: PRIVATE HEALTH INSURANCE

## 2012-03-02 DIAGNOSIS — E119 Type 2 diabetes mellitus without complications: Secondary | ICD-10-CM

## 2012-03-02 DIAGNOSIS — E785 Hyperlipidemia, unspecified: Secondary | ICD-10-CM

## 2012-03-02 LAB — COMPREHENSIVE METABOLIC PANEL
Albumin: 4.5 g/dL (ref 3.5–5.2)
BUN: 17 mg/dL (ref 6–23)
Calcium: 9.3 mg/dL (ref 8.4–10.5)
Chloride: 102 mEq/L (ref 96–112)
Creatinine, Ser: 1.2 mg/dL (ref 0.4–1.5)
Glucose, Bld: 102 mg/dL — ABNORMAL HIGH (ref 70–99)
Potassium: 4.2 mEq/L (ref 3.5–5.1)

## 2012-03-02 LAB — MICROALBUMIN / CREATININE URINE RATIO: Microalb Creat Ratio: 0.6 mg/g (ref 0.0–30.0)

## 2012-03-02 LAB — LIPID PANEL
Cholesterol: 142 mg/dL (ref 0–200)
HDL: 47.8 mg/dL (ref >39.00)
Triglycerides: 73 mg/dL (ref 0.0–149.0)

## 2012-03-02 NOTE — Addendum Note (Signed)
Addended by: Baldomero Lamy on: 03/02/2012 09:38 AM   Modules accepted: Orders

## 2012-03-07 ENCOUNTER — Encounter: Payer: Self-pay | Admitting: *Deleted

## 2012-03-07 ENCOUNTER — Encounter: Payer: Self-pay | Admitting: Family Medicine

## 2012-03-07 ENCOUNTER — Ambulatory Visit (INDEPENDENT_AMBULATORY_CARE_PROVIDER_SITE_OTHER): Payer: PRIVATE HEALTH INSURANCE | Admitting: Family Medicine

## 2012-03-07 VITALS — BP 124/80 | HR 100 | Temp 98.0°F | Ht 68.0 in | Wt 195.5 lb

## 2012-03-07 DIAGNOSIS — Z0000 Encounter for general adult medical examination without abnormal findings: Secondary | ICD-10-CM

## 2012-03-07 DIAGNOSIS — E785 Hyperlipidemia, unspecified: Secondary | ICD-10-CM

## 2012-03-07 DIAGNOSIS — G4700 Insomnia, unspecified: Secondary | ICD-10-CM

## 2012-03-07 DIAGNOSIS — Z125 Encounter for screening for malignant neoplasm of prostate: Secondary | ICD-10-CM

## 2012-03-07 DIAGNOSIS — E7800 Pure hypercholesterolemia, unspecified: Secondary | ICD-10-CM

## 2012-03-07 DIAGNOSIS — I2510 Atherosclerotic heart disease of native coronary artery without angina pectoris: Secondary | ICD-10-CM

## 2012-03-07 DIAGNOSIS — E119 Type 2 diabetes mellitus without complications: Secondary | ICD-10-CM

## 2012-03-07 DIAGNOSIS — I10 Essential (primary) hypertension: Secondary | ICD-10-CM

## 2012-03-07 DIAGNOSIS — Z23 Encounter for immunization: Secondary | ICD-10-CM

## 2012-03-07 DIAGNOSIS — E039 Hypothyroidism, unspecified: Secondary | ICD-10-CM

## 2012-03-07 LAB — HM DIABETES FOOT EXAM

## 2012-03-07 MED ORDER — ZOLPIDEM TARTRATE 5 MG PO TABS: 5.0000 mg | ORAL_TABLET | Freq: Every evening | ORAL | Status: DC | PRN

## 2012-03-07 NOTE — Progress Notes (Signed)
Subjective:    Patient ID: Brett Lowery, male    DOB: [birth year], 56 y.o.   MRN: [id number]  HPI  The patient is here for annual wellness exam and preventative care.    Diabetes:  Well controlled on no med. Lab Results  Component Value Date   HGBA1C 6.0 03/02/2012  Using medications without difficulties: Hypoglycemic episodes:? Hyperglycemic episodes:? Feet problems:None Blood Sugars averaging: not checking eye exam within last year: 11/2011  Elevated Cholesterol:  At goal on crestor Lab Results  Component Value Date   CHOL 142 03/02/2012   HDL 47.80 03/02/2012   LDLCALC 80 03/02/2012   TRIG 73.0 03/02/2012   CHOLHDL 3 03/02/2012  Using medications without problems: None Muscle aches: None Diet compliance: Moderate Exercise: walking off and on Other complaints:  Hypertension:  Well controlled on no med. Using medication without problems or lightheadedness:  none Chest pain with exertion:None, some chest pain when upset noted in last year.  He has been under a lot of stress. Stable stress test 3-4 years ago. Edema:None Short of breath:None Average home BPs:At goal at home. Other issues: CAD  Hypothyroid:  On levothyroid. Lab Results  Component Value Date   TSH 0.19* 09/17/2011  T4 was at that time.  Insomnia.. Still ongoing. Trazodone 1-2 tabs did not help at all. No depression, anxiety.      Review of Systems  Constitutional: Negative for fever, fatigue and unexpected weight change.  HENT: Negative for ear pain, congestion, sore throat, rhinorrhea, trouble swallowing, neck pain and postnasal drip.   Eyes: Negative for pain.  Respiratory: Negative for cough, shortness of breath and wheezing.   Cardiovascular: Positive for chest pain. Negative for palpitations and leg swelling.  Gastrointestinal: Negative for nausea, abdominal pain, diarrhea, constipation and blood in stool.  Genitourinary: Negative for dysuria, urgency, hematuria, discharge, penile swelling, scrotal  swelling, difficulty urinating, penile pain and testicular pain.  Skin: Negative for rash.  Neurological: Negative for syncope, weakness, light-headedness, numbness and headaches.  Psychiatric/Behavioral: Negative for behavioral problems and dysphoric mood. The patient is not nervous/anxious.        Objective:   Physical Exam  Constitutional: He appears well-developed and well-nourished.  Non-toxic appearance. He does not appear ill. No distress.  HENT:  Head: Normocephalic and atraumatic.  Right Ear: Hearing, tympanic membrane, external ear and ear canal normal.  Left Ear: Hearing, tympanic membrane, external ear and ear canal normal.  Nose: Nose normal.  Mouth/Throat: Uvula is midline, oropharynx is clear and moist and mucous membranes are normal.  Eyes: Conjunctivae normal, EOM and lids are normal. Pupils are equal, round, and reactive to light. No foreign bodies found.  Neck: Trachea normal, normal range of motion and phonation normal. Neck supple. Carotid bruit is not present. No mass and no thyromegaly present.  Cardiovascular: Normal rate, regular rhythm, S1 normal, S2 normal, intact distal pulses and normal pulses.  Exam reveals no gallop.   No murmur heard. Pulmonary/Chest: Breath sounds normal. He has no wheezes. He has no rhonchi. He has no rales.  Abdominal: Soft. Normal appearance and bowel sounds are normal. There is no hepatosplenomegaly. There is no tenderness. There is no rebound, no guarding and no CVA tenderness. No hernia. Hernia confirmed negative in the right inguinal area and confirmed negative in the left inguinal area.  Genitourinary: Prostate normal, testes normal and penis normal. Rectal exam shows no external hemorrhoid, no internal hemorrhoid, no fissure, no mass, no tenderness and anal tone normal. Guaiac negative stool. Prostate is  not enlarged and not tender. Right testis shows no mass and no tenderness. Left testis shows no mass and no tenderness. No  paraphimosis or penile tenderness.  Lymphadenopathy:    He has no cervical adenopathy.       Right: No inguinal adenopathy present.       Left: No inguinal adenopathy present.  Neurological: He is alert. He has normal strength and normal reflexes. No cranial nerve deficit or sensory deficit. Gait normal.  Skin: Skin is warm, dry and intact. No rash noted.  Psychiatric: He has a normal mood and affect. His speech is normal and behavior is normal. Judgment normal.          Assessment & Plan:  The patient's preventative maintenance and recommended screening tests for an annual wellness exam were reviewed in full today. Brought up to date unless services declined.  Counselled on the importance of diet, exercise, and its role in overall health and mortality. The patient's FH and SH was reviewed, including their home life, tobacco status, and drug and alcohol status.   Vaccines:Due for Td but refused, given flu vaccine today.   Colon: 2009, adenoma.. Likely repeat 5 years ie 06/2012, Dr. Russella Dar  Prostate: Stable PSA, rectal exam today.   Nonsmoker.

## 2012-03-07 NOTE — Assessment & Plan Note (Signed)
Well controlled. On no med.  

## 2012-03-07 NOTE — Assessment & Plan Note (Signed)
Almost at goal LD < 70 on crestor.

## 2012-03-07 NOTE — Assessment & Plan Note (Signed)
Well controlled. Continue current medication.  

## 2012-03-07 NOTE — Assessment & Plan Note (Signed)
Trial of ambien 

## 2012-03-07 NOTE — Patient Instructions (Addendum)
Work on increasing exercise,  Heart healthy diet. Stop at lab on your way out.  Trial of ambien for sleep. Set up colonoscopy with Dr. Russella Dar after 06/2012 If chest pain increasing.. Call for an appt.

## 2012-03-07 NOTE — Assessment & Plan Note (Addendum)
Well controlled at last check. 

## 2012-03-08 ENCOUNTER — Telehealth: Payer: Self-pay

## 2012-03-08 NOTE — Telephone Encounter (Signed)
Bing Ree with [employer] left v/m to see if received medical records request on 03/06/12. Ref # [id number]. Spoke with Rene Kocher at [facility] advised received med record request and sent to [employer] on 03/07/12; allow 5- 7 business days after [facility] receives request for info.Rene Kocher voiced understanding.

## 2012-03-09 ENCOUNTER — Encounter: Payer: Self-pay | Admitting: Family Medicine

## 2012-03-30 ENCOUNTER — Telehealth: Payer: Self-pay | Admitting: Family Medicine

## 2012-03-30 NOTE — Telephone Encounter (Signed)
Patient Information:  Caller Name: Maliek  Phone: [phone]  Patient: Brett Lowery, Brett Lowery  Gender: Male  DOB: [birth year]  Age: 57 Years  PCP: Kerby Nora (Family Practice)  Office Follow Up:  Does the office need to follow up with this patient?: Yes  Instructions For The Office: Patient states Ambien 5mg  did not work  Charity fundraiser Note:  Reviewed Sleep disturbance protocol with patient. He is requesting something for sleep. Ambien 5mg  did not work "like I didn't take anything".   He uses Pharm[facility].  Symptoms  Reason For Call & Symptoms: Patient was in office 03/07/12 with well check and issue of Insomnia. He was given Ambien 5mg  but it did not make difference.  He states that it "takes forever to go to sleep"  . Once he does fall asleep it has periods of wakelfulness during the night. Averages around 4 1/2 hours per night.   He states it was like he didn't take anything.  He tried the medication for 3 nights in a row.  He reports diet is normal balanced with no caffiene. Established Routine for sleep  Reviewed Health History In EMR: Yes  Reviewed Medications In EMR: Yes  Reviewed Allergies In EMR: Yes  Reviewed Surgeries / Procedures: No  Date of Onset of Symptoms: 03/31/2011  Guideline(s) Used:  No Protocol Available - Information Only  Disposition Per Guideline:   Discuss with PCP and Callback by Nurse Today  Reason For Disposition Reached:   Nursing judgment  Advice Given:  Call Back If:  New symptoms develop  You become worse.

## 2012-03-31 NOTE — Telephone Encounter (Signed)
Patient has not tried 10 mg but, will try it tonight and see if that works

## 2012-03-31 NOTE — Telephone Encounter (Signed)
Has he tried 10 mg of ambien at bedtime yet?

## 2012-04-17 ENCOUNTER — Telehealth: Payer: Self-pay | Admitting: Family Medicine

## 2012-04-17 MED ORDER — ZOLPIDEM TARTRATE 10 MG PO TABS: 10.0000 mg | ORAL_TABLET | Freq: Every evening | ORAL | Status: DC | PRN

## 2012-04-17 NOTE — Telephone Encounter (Signed)
rx called to pharmacy 

## 2012-04-17 NOTE — Telephone Encounter (Signed)
Patient calling about Ambien for sleep.  Initially prescribed 5mg  which did not help and instructed to increase dosae to 10mg  and report if it helped.  Patient reports that Ambien 10mg  helps him get to sleep although he still has trouble staying asleep.  Patient is out of medication and is requesting a prescriptioon for Ambien 10mg  as he and Dr Ermalene Searing had discussed.  Please call.

## 2012-04-21 ENCOUNTER — Other Ambulatory Visit: Payer: Self-pay | Admitting: Family Medicine

## 2012-05-29 ENCOUNTER — Other Ambulatory Visit: Payer: Self-pay | Admitting: Family Medicine

## 2012-05-31 ENCOUNTER — Other Ambulatory Visit: Payer: Self-pay | Admitting: *Deleted

## 2012-05-31 MED ORDER — ROSUVASTATIN CALCIUM 20 MG PO TABS: ORAL_TABLET | ORAL | Status: DC

## 2012-06-26 ENCOUNTER — Encounter: Payer: Self-pay | Admitting: Gastroenterology

## 2012-07-09 ENCOUNTER — Other Ambulatory Visit: Payer: Self-pay | Admitting: Family Medicine

## 2012-07-10 ENCOUNTER — Other Ambulatory Visit: Payer: Self-pay | Admitting: *Deleted

## 2012-07-10 MED ORDER — LANSOPRAZOLE 30 MG PO CPDR: 30.0000 mg | DELAYED_RELEASE_CAPSULE | Freq: Every day | ORAL | Status: DC

## 2012-08-16 ENCOUNTER — Other Ambulatory Visit: Payer: Self-pay | Admitting: Family Medicine

## 2012-08-25 LAB — HM DIABETES EYE EXAM

## 2012-09-28 ENCOUNTER — Ambulatory Visit (INDEPENDENT_AMBULATORY_CARE_PROVIDER_SITE_OTHER): Payer: PRIVATE HEALTH INSURANCE | Admitting: Family Medicine

## 2012-09-28 ENCOUNTER — Ambulatory Visit (INDEPENDENT_AMBULATORY_CARE_PROVIDER_SITE_OTHER)
Admission: RE | Admit: 2012-09-28 | Discharge: 2012-09-28 | Disposition: A | Discharge status: Home / Self Care | Payer: PRIVATE HEALTH INSURANCE | Source: Ambulatory Visit | Attending: Family Medicine | Admitting: Family Medicine

## 2012-09-28 ENCOUNTER — Encounter: Payer: Self-pay | Admitting: Family Medicine

## 2012-09-28 VITALS — BP 120/80 | HR 66 | Temp 98.0°F | Ht 68.0 in | Wt 201.0 lb

## 2012-09-28 DIAGNOSIS — H9319 Tinnitus, unspecified ear: Secondary | ICD-10-CM

## 2012-09-28 DIAGNOSIS — M542 Cervicalgia: Secondary | ICD-10-CM

## 2012-09-28 DIAGNOSIS — G8929 Other chronic pain: Secondary | ICD-10-CM | POA: Insufficient documentation

## 2012-09-28 DIAGNOSIS — H9313 Tinnitus, bilateral: Secondary | ICD-10-CM

## 2012-09-28 MED ORDER — MELOXICAM 15 MG PO TABS: 15.0000 mg | ORAL_TABLET | Freq: Every day | ORAL | Status: DC

## 2012-09-28 NOTE — Patient Instructions (Addendum)
We will call with X-ray. Try a trial of meloxicam for pain.  Stop at front desk for referral.

## 2012-09-28 NOTE — Assessment & Plan Note (Addendum)
No vascular component. Likely due to hearing loss.  Audiometry screening today showed decreased hearing. Refer to audiologist.

## 2012-09-28 NOTE — Progress Notes (Signed)
  Subjective:    Patient ID: Brett Lowery, male    DOB: Sep 22, 1955, 57 y.o.   MRN: [id number]  HPI  57 year old male presents with neck pain and decreased neck mobility ongoing for 1 year. He has noted he has decreased ROM ( cannot lateral rotation) .Marland Kitchen Gradually worsening.  Hears grinding when he moved head.  Pain mainly with moving neck. Pain is minimal. Some pain in left should upper,back. No pain radiating down arms, no numbness.  No past neck injury, no past surgeries.  He has noted ringing in ears for 1 year. Worse if in a crowd. Has noted decrease in hearing in both ears some. No vertigo. No loud noise exposure history.   Review of Systems  Constitutional: Negative for fever and fatigue.  HENT: Negative for ear pain.   Eyes: Negative for pain.  Respiratory: Negative for shortness of breath.   Cardiovascular: Negative for chest pain.  Gastrointestinal: Negative for abdominal pain.       Objective:   Physical Exam  Constitutional: Vital signs are normal. He appears well-developed and well-nourished.  HENT:  Head: Normocephalic.  Right Ear: Hearing normal.  Left Ear: Hearing normal.  Nose: Nose normal.  Mouth/Throat: Oropharynx is clear and moist and mucous membranes are normal.  Neck: Trachea normal. Carotid bruit is not present. No mass and no thyromegaly present.  Cardiovascular: Normal rate, regular rhythm and normal pulses.  Exam reveals no gallop, no distant heart sounds and no friction rub.   No murmur heard. No peripheral edema  Pulmonary/Chest: Effort normal and breath sounds normal. No respiratory distress.  Musculoskeletal:  Decreased ROM in neck, no focal ttp, neg SPurlings  Skin: Skin is warm, dry and intact. No rash noted.  Psychiatric: He has a normal mood and affect. His speech is normal and behavior is normal. Thought content normal.          Assessment & Plan:

## 2012-09-28 NOTE — Assessment & Plan Note (Signed)
No sign of radiculopathy. Likely due to arthritis.  Pain is not as much of an issue as decreased ROM with driving.  Eval with X-ray.  Can try trial of meloxicam for pain.

## 2012-10-25 ENCOUNTER — Other Ambulatory Visit: Payer: Self-pay | Admitting: Family Medicine

## 2012-12-14 ENCOUNTER — Other Ambulatory Visit: Payer: Self-pay | Admitting: Family Medicine

## 2012-12-14 NOTE — Telephone Encounter (Signed)
Last office visit 09/28/2012.  Ok to refill?

## 2013-01-01 ENCOUNTER — Ambulatory Visit (INDEPENDENT_AMBULATORY_CARE_PROVIDER_SITE_OTHER): Payer: PRIVATE HEALTH INSURANCE | Admitting: Family Medicine

## 2013-01-01 ENCOUNTER — Encounter: Payer: Self-pay | Admitting: Family Medicine

## 2013-01-01 VITALS — BP 150/90 | HR 71 | Temp 97.7°F | Ht 68.0 in | Wt 205.5 lb

## 2013-01-01 DIAGNOSIS — Z23 Encounter for immunization: Secondary | ICD-10-CM

## 2013-01-01 DIAGNOSIS — M542 Cervicalgia: Secondary | ICD-10-CM

## 2013-01-01 DIAGNOSIS — M5030 Other cervical disc degeneration, unspecified cervical region: Secondary | ICD-10-CM | POA: Insufficient documentation

## 2013-01-01 DIAGNOSIS — G8929 Other chronic pain: Secondary | ICD-10-CM

## 2013-01-01 DIAGNOSIS — IMO0002 Reserved for concepts with insufficient information to code with codable children: Secondary | ICD-10-CM

## 2013-01-01 MED ORDER — TIZANIDINE HCL 4 MG PO TABS: 4.0000 mg | ORAL_TABLET | Freq: Every evening | ORAL | Status: DC

## 2013-01-01 NOTE — Progress Notes (Signed)
Date:  01/01/2013   Name:  Brett Lowery   DOB:  06/05/1955   MRN:  [id number] Gender: male Age: 57 y.o.  Primary Physician:  Kerby Nora, MD   Chief Complaint: Neck Pain   Subjective:   History of Present Illness:  Brett Lowery is a 57 y.o. pleasant patient who presents with the following:  Neck is really stiff and having pain with it and having some pain on the right aspect of his head. Neck motion is decreased a lot. Hurts to move his head go back. Mobic did not really help at all. Hot shower will make it feel better. He is also trying to use a heating pad, but it has not really helped all that much. He is never had a history of spine surgery, and he is never had epidural injections in the past. He also denies any history of prior spine physical therapy. He does have a very manual job. He denies any ongoing radiculopathy, numbness or weakness in his upper extremities. His pain is primarily confined to his neck region. He denies any significant thoracic or lumbar complaints.  Heating pad does not help.   Manager with service work, heating and air.    Patient Active Problem List   Diagnosis Date Noted  . Degenerative disk disease 01/01/2013  . Chronic neck pain 09/28/2012  . Tinnitus of both ears 09/28/2012  . Insomnia 09/17/2011  . Left lower quadrant pain 09/17/2011  . Pulmonary nodule seen on imaging study 09/17/2011  . BENIGN PROSTATIC HYPERTROPHY, HX OF 12/17/2009  . CHRONIC RHINITIS 12/18/2008  . DYSLIPIDEMIA 07/10/2008  . DIABETES MELLITUS, WITHOUT COMPLICATIONS 11/08/2007  . CAD 04/25/2007  . HYPOTHYROIDISM 06/15/2006  . HYPERCHOLESTEROLEMIA 06/15/2006  . HYPERTENSION 06/15/2006  . GERD 06/15/2006    Past Medical History  Diagnosis Date  . HTN (hypertension)   . CAD (coronary artery disease)   . Dyslipidemia   . Chest pain   . DM (diabetes mellitus)     w/o complications  . Well adult exam   . Special screening for malignant neoplasm of prostate   .  Special screening for malignant neoplasms, colon   . Insomnia   . Disorders of bursae and tendons in shoulder region, unspecified     R, tendonitis  . GERD (gastroesophageal reflux disease)   . Hypothyroidism   . Hypercholesterolemia     Past Surgical History  Procedure Laterality Date  . Thyroidectomy  2004    thyroid nodile  . Cornia transplants  2003  . Hernia repair  2002  . Dg esophagus -ba sw  2005    mod reflux and hiatal hernia  . Cardiac catheterization  2009    shoed no obstructive CAD, medical management     History   Social History  . Marital Status: Married    Spouse Name: N/A    Number of Children: N/A  . Years of Education: N/A   Occupational History  . Not on file.   Social History Main Topics  . Smoking status: Former Games developer  . Smokeless tobacco: Never Used     Comment: 40 pack year hx   . Alcohol Use: No  . Drug Use: No  . Sexual Activity: Not on file   Other Topics Concern  . Not on file   Social History Narrative   Married x30 years; 2 daughters 5-23   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   Diet: fruit and veggies. Occ. Fast food on  weekends.       designated party release form signed authorizing Elease Hashimoto C. Barto (wife) [phone]. Daine Gip 12/12/09.     Family History  Problem Relation Age of Onset  . Heart attack Neg Hx     < age 40    No Known Allergies  Medication list has been reviewed and updated.  Review of Systems:   GEN: No fevers, chills. Nontoxic. Primarily MSK c/o today. MSK: Detailed in the HPI GI: tolerating PO intake without difficulty Neuro: No numbness, parasthesias, or tingling associated. Otherwise the pertinent positives of the ROS are noted above.   Objective:   Physical Examination: BP 150/90  Pulse 71  Temp(Src) 97.7 F (36.5 C) (Oral)  Ht 5\' 8"  (1.727 m)  Wt 205 lb 8 oz (93.214 kg)  BMI 31.25 kg/m2  Ideal Body Weight: Weight in (lb) to have BMI = 25:  164.1   GEN: Well-developed,well-nourished,in no acute distress; alert,appropriate and cooperative throughout examination HEENT: Normocephalic and atraumatic without obvious abnormalities. Ears, externally no deformities PULM: Breathing comfortably in no respiratory distress EXT: No clubbing, cyanosis, or edema PSYCH: Normally interactive. Cooperative during the interview. Pleasant. Friendly and conversant. Not anxious or depressed appearing. Normal, full affect.  CERVICAL SPINE EXAM Range of motion: Flexion, extension, lateral bending, and rotation: markedly diminished in all directions. He has an approximate 60 percent loss in the lateral movement of his neck as well as lateral rotation. Flexion and extension are somewhat better, but he still has at least a 40 percent loss of motion. Pain with terminal motion: yes and in all directions. Spinous Processes: NT SCM: NT Upper paracervical muscles: modestly tender.  Upper traps: NT C5-T1 intact, sensation and motor   Dg Cervical Spine Complete  09/28/2012   *RADIOLOGY REPORT*  Clinical Data: Neck pain and decreased range of motion and neck  CERVICAL SPINE - COMPLETE 4+ VIEW  Comparison: None.  Findings: Frontal, lateral, open mouth odontoid, and bilateral oblique views were obtained.  There is no fracture or spondylolisthesis.  Prevertebral soft tissues and predental space regions are normal.  There is moderately severe disc space narrowing at C3-4, C4-5, C5- 6, C6-7.  There are anterior osteophytes at C3, C4, C5, C6, and C7. There is exit foraminal narrowing due to facet hypertrophy at C4-5 bilaterally, C5-6 on the right, and C6-7 bilaterally.  No erosive change.  There are small cervical ribs bilaterally.  There is upper thoracic dextroscoliosis.  IMPRESSION: Multilevel osteoarthritic change.  No fracture or spondylolisthesis.  Small cervical ribs bilaterally.  Upper thoracic dextroscoliosis.  Comment:  Surgical clips are present in the lower neck  region bilaterally.  The distribution suggests previous surgery involving the thyroid.   Original Report Authenticated By: Bretta Bang, M.D.   I pulled up his cervical spine series and reviewed it with him in the office. He has extensive degenerative disc disease throughout most of his cervical spine. As noted, and agree this is most prolific in the levels of C3-C7. He also has spondyloarthropathy.  Assessment & Plan:    Degenerative disk disease - Plan: Ambulatory referral to Physical Therapy  Need for prophylactic vaccination and inoculation against influenza - Plan: Flu Vaccine QUAD 36+ mos PF IM (Fluarix)  Chronic neck pain - Plan: Ambulatory referral to Physical Therapy  Challenging problem with multilevel severe degenerative disc disease and spondyloarthropathy. I am going to discussed with him that I think that the thing that would likely help him or do anything would be to try to get  some of his motion back and doing some formal spine therapy. We will start this.  Also gave him some Zanaflex to try at nighttime.  Long-term management can be difficult in these cases. Consideration of various other pain modalities included lidocaine patches, acupuncture, chiropractic manipulation could potentially be of benefit, and he is someone who potentially could be considered for narcotic pain management if his symptoms become severely limited.  Patient Instructions  REFERRAL: GO THE THE FRONT ROOM AT THE ENTRANCE OF OUR CLINIC, NEAR CHECK IN. ASK FOR MARION. SHE WILL HELP YOU SET UP YOUR REFERRAL. DATE: TIME:    Orders Today:  Orders Placed This Encounter  Procedures  . Flu Vaccine QUAD 36+ mos PF IM (Fluarix)  . Ambulatory referral to Physical Therapy    Referral Priority:  Routine    Referral Type:  Physical Medicine    Referral Reason:  Specialty Services Required    Requested Specialty:  Physical Therapy    Number of Visits Requested:  1    New medications, updates to  list, dose adjustments: Meds ordered this encounter  Medications  . tiZANidine (ZANAFLEX) 4 MG tablet    Sig: Take 1 tablet (4 mg total) by mouth Nightly.    Dispense:  30 tablet    Refill:  2    Signed,  Arleen Bar T. Marijane Trower, MD, CAQ Sports Medicine  [facility] [address] [place] [address] Phone: [phone] Fax: [phone]  Updated Complete Medication List:   Medication List       This list is accurate as of: 01/01/13 11:59 PM.  Always use your most recent med list.               aspirin 81 MG tablet  Take 81 mg by mouth daily.     lansoprazole 30 MG capsule  Commonly known as:  PREVACID  Take 1 capsule (30 mg total) by mouth daily.     meloxicam 15 MG tablet  Commonly known as:  MOBIC  TAKE 1 TABLET EVERY DAY     rosuvastatin 20 MG tablet  Commonly known as:  CRESTOR  TAKE 1 TABLET EVERY DAY     SYNTHROID 137 MCG tablet  Generic drug:  levothyroxine  TAKE 1 TABLET BY MOUTH EVERY DAY     tamsulosin 0.4 MG Caps capsule  Commonly known as:  FLOMAX  TAKE ONE CAPSULE EVERY DAY     tiZANidine 4 MG tablet  Commonly known as:  ZANAFLEX  Take 1 tablet (4 mg total) by mouth Nightly.     traZODone 50 MG tablet  Commonly known as:  DESYREL  TAKE 1/2 TO 1 TABLET BY MOUTH AT BEDTIME AS NEEDED FOR SLEEP

## 2013-01-01 NOTE — Patient Instructions (Signed)
REFERRAL: GO THE THE FRONT ROOM AT THE ENTRANCE OF OUR CLINIC, NEAR CHECK IN. ASK FOR Brett Lowery. SHE WILL HELP YOU SET UP YOUR REFERRAL. DATE: TIME:  

## 2013-01-01 NOTE — Progress Notes (Signed)
Pre-visit discussion using our clinic review tool. No additional management support is needed unless otherwise documented below in the visit note.  

## 2013-02-11 ENCOUNTER — Other Ambulatory Visit: Payer: Self-pay | Admitting: Family Medicine

## 2013-02-16 ENCOUNTER — Telehealth: Payer: Self-pay

## 2013-02-16 MED ORDER — TRAMADOL HCL 50 MG PO TABS: 50.0000 mg | ORAL_TABLET | Freq: Four times a day (QID) | ORAL | Status: DC | PRN

## 2013-02-16 NOTE — Telephone Encounter (Signed)
call  Tramadol 50 mg, 1 po q 6 hours prn pain, #50, 2 refills

## 2013-02-16 NOTE — Telephone Encounter (Signed)
[facility] as instructed.Patient notified as instructed by telephone and pt will cb next week with update if pain does not improve.

## 2013-02-16 NOTE — Telephone Encounter (Signed)
Pt was seen 01/01/13; pt is still taking PT in ; heat seems to help neck pain and stiffness on rt side of neck. Pt also has h/a. Pt was taking meloxicam 15 mg 1 1/2 tab daily which helped pain somewhat but never relieved the neck pain. Today pain level is 6. Pt request stronger pain med to [facility]. Pt request cb.

## 2013-02-27 ENCOUNTER — Encounter: Payer: Self-pay | Admitting: Family Medicine

## 2013-02-27 ENCOUNTER — Ambulatory Visit (INDEPENDENT_AMBULATORY_CARE_PROVIDER_SITE_OTHER): Payer: PRIVATE HEALTH INSURANCE | Admitting: Family Medicine

## 2013-02-27 VITALS — BP 160/100 | HR 95 | Temp 97.6°F | Ht 68.0 in | Wt 208.0 lb

## 2013-02-27 DIAGNOSIS — R0600 Dyspnea, unspecified: Secondary | ICD-10-CM | POA: Insufficient documentation

## 2013-02-27 DIAGNOSIS — R0989 Other specified symptoms and signs involving the circulatory and respiratory systems: Secondary | ICD-10-CM

## 2013-02-27 DIAGNOSIS — I2510 Atherosclerotic heart disease of native coronary artery without angina pectoris: Secondary | ICD-10-CM

## 2013-02-27 DIAGNOSIS — I10 Essential (primary) hypertension: Secondary | ICD-10-CM

## 2013-02-27 DIAGNOSIS — G8929 Other chronic pain: Secondary | ICD-10-CM | POA: Insufficient documentation

## 2013-02-27 DIAGNOSIS — R079 Chest pain, unspecified: Secondary | ICD-10-CM

## 2013-02-27 DIAGNOSIS — R0609 Other forms of dyspnea: Secondary | ICD-10-CM | POA: Insufficient documentation

## 2013-02-27 MED ORDER — LOSARTAN POTASSIUM-HCTZ 50-12.5 MG PO TABS: 1.0000 | ORAL_TABLET | Freq: Every day | ORAL | Status: DC

## 2013-02-27 NOTE — Assessment & Plan Note (Signed)
Concerning for ischemia.. Will refer to cards ASAP. Given CAD history they may want to proceed directly with catheterization. Continue baby aspirin.

## 2013-02-27 NOTE — Assessment & Plan Note (Signed)
Very elevated. Start losartan HCTZ. May also need to increase or add BBLocker.

## 2013-02-27 NOTE — Progress Notes (Signed)
   Subjective:    Patient ID: Brett Lowery, male    DOB: 11-06-1955, 58 y.o.   MRN: [id number]  HPI  58 year old male with history of CAD ( medical management, DM, high cholesterol presents for elevated BPs in last few weeks. BP at home have been 172/118.  He has been having some headaches, no blurred vision.  2 weeks ago he had a spell of getting hot, sweaty, nausea while working mildly in yard. Had some chest tightness, SOB at that time. Resolved after rested through the day. Felt better the next day..  Now when he walks or lifts things he feels more short of breath. He has some chest tightness right now, here  nowconstant in last 2 weeks. Occ dizziness. He has a history of HTN.  Using medication without problems or lightheadedness:  BPs have previously been well controlled on no medication. Edema: none  BP Readings from Last 3 Encounters:  02/27/13 160/100  01/01/13 150/90  09/28/12 120/80   Last stress test per pt 3-4 years ago.. Stable.   Takes baby aspirin daily.    Review of Systems  Constitutional: Negative for fever and fatigue.  HENT: Negative for ear pain.   Eyes: Negative for pain.  Respiratory: Negative for cough and wheezing.   Gastrointestinal: Negative for abdominal distention.       Objective:   Physical Exam  Constitutional: Vital signs are normal. He appears well-developed and well-nourished.  HENT:  Head: Normocephalic.  Right Ear: Hearing normal.  Left Ear: Hearing normal.  Nose: Nose normal.  Mouth/Throat: Oropharynx is clear and moist and mucous membranes are normal.  Neck: Trachea normal. Carotid bruit is not present. No mass and no thyromegaly present.  Cardiovascular: Normal rate, regular rhythm and normal pulses.  Exam reveals no gallop, no distant heart sounds and no friction rub.   No murmur heard. No peripheral edema  Pulmonary/Chest: Effort normal and breath sounds normal. No respiratory distress.  Skin: Skin is warm, dry and intact. No  rash noted.  Psychiatric: He has a normal mood and affect. His speech is normal and behavior is normal. Thought content normal.          Assessment & Plan:

## 2013-02-27 NOTE — Progress Notes (Signed)
Pre-visit discussion using our clinic review tool. No additional management support is needed unless otherwise documented below in the visit note.  

## 2013-02-27 NOTE — Patient Instructions (Addendum)
Stop at front desk for cardiology referral ASAP. Start losartan daily for BP.  Follow BP at home, goal <140/90  Follow up in 1 week for BP check. Go TO ER if severe chest pain.

## 2013-02-28 ENCOUNTER — Telehealth: Payer: Self-pay | Admitting: Family Medicine

## 2013-02-28 NOTE — Telephone Encounter (Signed)
Relevant patient education assigned to patient using Emmi. ° °

## 2013-03-01 ENCOUNTER — Encounter: Payer: Self-pay | Admitting: Cardiovascular Disease

## 2013-03-01 ENCOUNTER — Ambulatory Visit (INDEPENDENT_AMBULATORY_CARE_PROVIDER_SITE_OTHER): Payer: PRIVATE HEALTH INSURANCE | Admitting: Cardiovascular Disease

## 2013-03-01 VITALS — BP 130/90 | HR 84 | Ht 68.0 in | Wt 204.0 lb

## 2013-03-01 DIAGNOSIS — Z0000 Encounter for general adult medical examination without abnormal findings: Secondary | ICD-10-CM

## 2013-03-01 DIAGNOSIS — R079 Chest pain, unspecified: Secondary | ICD-10-CM

## 2013-03-01 NOTE — Assessment & Plan Note (Signed)
Mild by cath in 2009  Normal stress test 2010  Atypical symptoms with multiple CRFls and nonspecific ECG changes F/U stress myovue

## 2013-03-01 NOTE — Assessment & Plan Note (Signed)
Cholesterol is at goal.  Continue current dose of statin and diet Rx.  No myalgias or side effects.  F/U  LFT's in 6 months. Lab Results  Component Value Date   LDLCALC 80 03/02/2012   Repeat labs ordered morning of stress test when he will be fasting

## 2013-03-01 NOTE — Patient Instructions (Signed)
Your physician wants you to follow-up in:   6 monrths with dr Blima Singer will receive a reminder letter in the mail two months in advance. If you don't receive a letter, please call our office to schedule the follow-up appointment. Your physician recommends that you continue on your current medications as directed. Please refer to the Current Medication list given to you today.  Your physician recommends that you return for lab work in: [place]   BMET  CBC  LIPID LIVER  A1C  AND  PSA  Your physician has requested that you have en exercise stress myoview. For further information please visit [email or web address]. Please follow instruction sheet, as given.

## 2013-03-01 NOTE — Assessment & Plan Note (Signed)
Well controlled.  Continue current medications and low sodium Dash type diet.   Improved on ARB  Will see what BP response is on stress test

## 2013-03-01 NOTE — Progress Notes (Signed)
Patient ID: Brett Lowery, male   DOB: September 18, 1955, 58 y.o.   MRN: [id number] Brett Lowery is seen today at the request of Dr.Bedsole. He has actually been seen by myself in February of 2009 when he was admitted to the hospital with chest pain. His catheterization showed only minimal 20-30% disease in the LAD. He had a stress test in 2010 that was also normal His multiple coronary risk factors including hypercholesterolemia hypertension and diabetes. He was just started on losartan for his BP   He takes an aspirin a day. he gets exertional dyspnea. This is a chronic problem. He is a previous smoker and likely has some COPD. His been no PND orthopnea or lower extremity edema. The dyspnea is usually exertional. It is not made worse by the pollen. I don't have formal PFTs on him.  About 3 weeks ago was working outside and got diaphoretic with nausea and presyncope.  Felt weak for a few hours with "chest tightness"  No fever or other GI symptoms No palpitations That's when he noted BP being high.  Intermittantly will have tightness in chest.  Non exertional  No radiation   1/14 labs reviewed A1c 6 and LDL 80    ROS: Denies fever, malais, weight loss, blurry vision, decreased visual acuity, cough, sputum, SOB, hemoptysis, pleuritic pain, palpitaitons, heartburn, abdominal pain, melena, lower extremity edema, claudication, or rash.  All other systems reviewed and negative   General: Affect appropriate Healthy:  appears stated age 58: normal Neck supple with no adenopathy JVP normal no bruits no thyromegaly Lungs clear with no wheezing and good diaphragmatic motion Heart:  S1/S2 no murmur,rub, gallop or click PMI normal Abdomen: benighn, BS positve, no tenderness, no AAA no bruit.  No HSM or HJR Distal pulses intact with no bruits No edema Neuro non-focal Skin warm and dry No muscular weakness  Medications Current Outpatient Prescriptions  Medication Sig Dispense Refill  . aspirin 81 MG tablet Take  81 mg by mouth daily.        . lansoprazole (PREVACID) 30 MG capsule TAKE ONE CAPSULE BY MOUTH DAILY  30 capsule  5  . losartan-hydrochlorothiazide (HYZAAR) 50-12.5 MG per tablet Take 1 tablet by mouth daily.  30 tablet  11  . rosuvastatin (CRESTOR) 20 MG tablet TAKE 1 TABLET EVERY DAY  30 tablet  6  . SYNTHROID 137 MCG tablet TAKE 1 TABLET BY MOUTH EVERY DAY  30 tablet  9  . tamsulosin (FLOMAX) 0.4 MG CAPS TAKE ONE CAPSULE EVERY DAY  30 capsule  6   No current facility-administered medications for this visit.    Allergies Review of patient's allergies indicates no known allergies.  Family History: Family History  Problem Relation Age of Onset  . Heart attack Neg Hx     < age 24    Social History: History   Social History  . Marital Status: Married    Spouse Name: N/A    Number of Children: N/A  . Years of Education: N/A   Occupational History  . Not on file.   Social History Main Topics  . Smoking status: Former Research scientist (life sciences)  . Smokeless tobacco: Never Used     Comment: 40 pack year hx   . Alcohol Use: No  . Drug Use: No  . Sexual Activity: Not on file   Other Topics Concern  . Not on file   Social History Narrative   Married x30 years; 2 daughters 5-23   Occupation: heating and Personal assistant. Does not  get regular exercise.   Diet: fruit and veggies. Occ. Fast food on weekends.       designated party release form signed authorizing Brett Lowery (wife) [phone]. Brett Lowery 12/12/09.     Electrocardiogram:  1/6  SR rate 80 RSR' nonspecific T wave changes in inferior leads   Assessment and Plan

## 2013-03-01 NOTE — Assessment & Plan Note (Signed)
Discussed low carb diet.  Target hemoglobin A1c is 6.5 or less.  Continue current medications.  

## 2013-03-07 ENCOUNTER — Ambulatory Visit (HOSPITAL_COMMUNITY): Payer: PRIVATE HEALTH INSURANCE | Attending: Cardiovascular Disease | Admitting: Radiology

## 2013-03-07 ENCOUNTER — Other Ambulatory Visit (INDEPENDENT_AMBULATORY_CARE_PROVIDER_SITE_OTHER): Payer: PRIVATE HEALTH INSURANCE

## 2013-03-07 VITALS — BP 140/92 | Ht 68.0 in | Wt 204.0 lb

## 2013-03-07 DIAGNOSIS — R0609 Other forms of dyspnea: Secondary | ICD-10-CM | POA: Insufficient documentation

## 2013-03-07 DIAGNOSIS — Z87891 Personal history of nicotine dependence: Secondary | ICD-10-CM | POA: Insufficient documentation

## 2013-03-07 DIAGNOSIS — R079 Chest pain, unspecified: Secondary | ICD-10-CM

## 2013-03-07 DIAGNOSIS — Z8249 Family history of ischemic heart disease and other diseases of the circulatory system: Secondary | ICD-10-CM | POA: Insufficient documentation

## 2013-03-07 DIAGNOSIS — E785 Hyperlipidemia, unspecified: Secondary | ICD-10-CM | POA: Insufficient documentation

## 2013-03-07 DIAGNOSIS — R0989 Other specified symptoms and signs involving the circulatory and respiratory systems: Secondary | ICD-10-CM | POA: Insufficient documentation

## 2013-03-07 DIAGNOSIS — R0789 Other chest pain: Secondary | ICD-10-CM | POA: Insufficient documentation

## 2013-03-07 DIAGNOSIS — E119 Type 2 diabetes mellitus without complications: Secondary | ICD-10-CM | POA: Insufficient documentation

## 2013-03-07 DIAGNOSIS — Z0000 Encounter for general adult medical examination without abnormal findings: Secondary | ICD-10-CM

## 2013-03-07 DIAGNOSIS — I10 Essential (primary) hypertension: Secondary | ICD-10-CM | POA: Insufficient documentation

## 2013-03-07 LAB — BASIC METABOLIC PANEL WITH GFR
BUN: 17 mg/dL (ref 6–23)
CO2: 27 meq/L (ref 19–32)
Calcium: 9.3 mg/dL (ref 8.4–10.5)
Chloride: 105 meq/L (ref 96–112)
Creatinine, Ser: 1.3 mg/dL (ref 0.4–1.5)
GFR: 62.89 mL/min
Glucose, Bld: 98 mg/dL (ref 70–99)
Potassium: 4.1 meq/L (ref 3.5–5.1)
Sodium: 140 meq/L (ref 135–145)

## 2013-03-07 LAB — LIPID PANEL
CHOL/HDL RATIO: 3
Cholesterol: 140 mg/dL (ref 0–200)
HDL: 43.8 mg/dL (ref >39.00)
LDL Cholesterol: 81 mg/dL (ref 0–99)
Triglycerides: 75 mg/dL (ref 0.0–149.0)
VLDL: 15 mg/dL (ref 0.0–40.0)

## 2013-03-07 LAB — CBC WITH DIFFERENTIAL/PLATELET
Basophils Absolute: 0.1 10*3/uL (ref 0.0–0.1)
Basophils Relative: 0.7 % (ref 0.0–3.0)
EOS PCT: 2.7 % (ref 0.0–5.0)
Eosinophils Absolute: 0.2 10*3/uL (ref 0.0–0.7)
HEMATOCRIT: 45.2 % (ref 39.0–52.0)
Hemoglobin: 15.3 g/dL (ref 13.0–17.0)
LYMPHS ABS: 1.9 10*3/uL (ref 0.7–4.0)
Lymphocytes Relative: 23.9 % (ref 12.0–46.0)
MCHC: 33.8 g/dL (ref 30.0–36.0)
MCV: 86.1 fl (ref 78.0–100.0)
MONOS PCT: 8.5 % (ref 3.0–12.0)
Monocytes Absolute: 0.7 10*3/uL (ref 0.1–1.0)
NEUTROS PCT: 64.2 % (ref 43.0–77.0)
Neutro Abs: 5.1 10*3/uL (ref 1.4–7.7)
Platelets: 216 10*3/uL (ref 150.0–400.0)
RBC: 5.25 Mil/uL (ref 4.22–5.81)
RDW: 13.5 % (ref 11.5–14.6)
WBC: 7.9 10*3/uL (ref 4.5–10.5)

## 2013-03-07 LAB — HEPATIC FUNCTION PANEL
ALK PHOS: 67 U/L (ref 39–117)
ALT: 31 U/L (ref 0–53)
AST: 26 U/L (ref 0–37)
Albumin: 4.5 g/dL (ref 3.5–5.2)
BILIRUBIN DIRECT: 0.2 mg/dL (ref 0.0–0.3)
Total Bilirubin: 0.9 mg/dL (ref 0.3–1.2)
Total Protein: 7.1 g/dL (ref 6.0–8.3)

## 2013-03-07 LAB — PSA: PSA: 1.88 ng/mL (ref 0.10–4.00)

## 2013-03-07 LAB — HEMOGLOBIN A1C: Hgb A1c MFr Bld: 6.2 % (ref 4.6–6.5)

## 2013-03-07 MED ORDER — TECHNETIUM TC 99M SESTAMIBI GENERIC - CARDIOLITE
30.0000 | Freq: Once | INTRAVENOUS | Status: AC | PRN
  Administered 2013-03-07: 30 via INTRAVENOUS

## 2013-03-07 MED ORDER — TECHNETIUM TC 99M SESTAMIBI GENERIC - CARDIOLITE
10.0000 | Freq: Once | INTRAVENOUS | Status: AC | PRN
  Administered 2013-03-07: 10 via INTRAVENOUS

## 2013-03-07 NOTE — Progress Notes (Signed)
[facility] [address] [place], [place] [address] [phone]    Cardiology Nuclear Med Study  Brett Lowery is a 58 y.o. male     MRN : [id number]     DOB: 09/20/1955  Procedure Date: 03/07/2013  Nuclear Med Background Indication for Stress Test:  Evaluation for Ischemia History:  '09 Cath mild N/O Dz, '10 NL GXT Cardiac Risk Factors: Family History - CAD, History of Smoking, Hypertension, Lipids and NIDDM  Symptoms:  Chest Tightness and DOE   Nuclear Pre-Procedure Caffeine/Decaff Intake:  None NPO After: 7:30pm   Lungs:  clear O2 Sat: 98% on room air. IV 0.9% NS with Angio Cath:  22g  IV Site: R Hand  IV Started by:  Crissie Figures, RN  Chest Size (in):  46 Cup Size: n/a  Height: 5\' 8"  (1.727 m)  Weight:  204 lb (92.534 kg)  BMI:  Body mass index is 31.03 kg/(m^2). Tech Comments:  N/A    Nuclear Med Study 1 or 2 day study: 1 day  Stress Test Type:  Stress  Reading MD: N/A  Order Authorizing Provider:  Jenkins Rouge, MD  Resting Radionuclide: Technetium 21m Sestamibi  Resting Radionuclide Dose: 11.0 mCi   Stress Radionuclide:  Technetium 9m Sestamibi  Stress Radionuclide Dose: 33.0 mCi           Stress Protocol Rest HR: 80 Stress HR: 142  Rest BP: 140/92 Stress BP: 199/90  Exercise Time (min): 5:45 METS: 7.0   Predicted Max HR: 164 bpm % Max HR: 86.59 bpm Rate Pressure Product: 28258   Dose of Adenosine (mg):  n/a Dose of Lexiscan: n/a mg  Dose of Atropine (mg): n/a Dose of Dobutamine: n/a mcg/kg/min (at max HR)  Stress Test Technologist: Perrin Maltese, EMT-P  Nuclear Technologist:  Vedia Pereyra, CNMT     Rest Procedure:  Myocardial perfusion imaging was performed at rest 45 minutes following the intravenous administration of Technetium 55m Sestamibi. Rest ECG: NSR - Normal EKG  Stress Procedure:  The patient exercised on the treadmill utilizing the Bruce Protocol for 5:45 minutes. The patient stopped due to fatigue, sob,  and denied any chest pain.  Technetium 88m Sestamibi was injected at peak exercise and myocardial perfusion imaging was performed after a brief delay. Stress ECG: No significant change from baseline ECG  QPS Raw Data Images:  Normal; no motion artifact; normal heart/lung ratio. Stress Images:  Normal homogeneous uptake in all areas of the myocardium. Rest Images:  Normal homogeneous uptake in all areas of the myocardium. Subtraction (SDS):  No evidence of ischemia. Transient Ischemic Dilatation (Normal <1.22):  0.87 Lung/Heart Ratio (Normal <0.45):  0.30  Quantitative Gated Spect Images QGS EDV:  95 ml QGS ESV:  46 ml  Impression Exercise Capacity:  Lexiscan with no exercise. BP Response:  Hypertension at rest Clinical Symptoms:  No significant symptoms noted. ECG Impression:  No significant ECG changes with Lexiscan. Comparison with Prior Nuclear Study: No previous nuclear study performed  Overall Impression:  Normal stress nuclear study.  LV Ejection Fraction: 52%.  LV Wall Motion:  Borderline overall LV systolic function. Regional abnormalities are not seen.  Sanda Klein, MD, [facility] [phone] office [phone] pager

## 2013-03-14 ENCOUNTER — Other Ambulatory Visit: Payer: Self-pay | Admitting: Family Medicine

## 2013-03-19 ENCOUNTER — Telehealth: Payer: Self-pay | Admitting: Family Medicine

## 2013-03-19 NOTE — Telephone Encounter (Signed)
Pt had blood work a week ago when he had a stress test.  He has a physical coming up and wanted to make sure that no additional labs were needed before his appt.

## 2013-03-20 ENCOUNTER — Telehealth: Payer: Self-pay | Admitting: Family Medicine

## 2013-03-20 DIAGNOSIS — E119 Type 2 diabetes mellitus without complications: Secondary | ICD-10-CM

## 2013-03-20 DIAGNOSIS — Z125 Encounter for screening for malignant neoplasm of prostate: Secondary | ICD-10-CM

## 2013-03-20 DIAGNOSIS — E039 Hypothyroidism, unspecified: Secondary | ICD-10-CM

## 2013-03-20 DIAGNOSIS — E7800 Pure hypercholesterolemia, unspecified: Secondary | ICD-10-CM

## 2013-03-20 NOTE — Telephone Encounter (Signed)
Message copied by [name] on Tue Mar 20, 2013  7:37 AM ------      Message from: Ellamae Sia      Created: Mon Mar 12, 2013 12:44 PM      Regarding: Lab orders for Tuesday, 1.27.15       Patient is scheduled for CPX labs, please order future labs, Thanks , Brett Lowery       ------

## 2013-03-20 NOTE — Telephone Encounter (Signed)
Patient notified as instructed.  Lab appointment for tomorrow cancelled.

## 2013-03-20 NOTE — Telephone Encounter (Signed)
No other labs are needed.

## 2013-03-21 ENCOUNTER — Other Ambulatory Visit: Payer: Self-pay

## 2013-03-25 ENCOUNTER — Other Ambulatory Visit: Payer: Self-pay | Admitting: Family Medicine

## 2013-03-25 NOTE — Telephone Encounter (Signed)
Last office visit 02/27/2013 with Dr. Diona Browner.  Not on current medication list.  Ok to refill?

## 2013-03-26 NOTE — Telephone Encounter (Signed)
Spoke with patient, let him know refill for Zanaflex was sent to the pharmacy. Pt has a CPE 03/28/13.

## 2013-03-26 NOTE — Telephone Encounter (Signed)
Refilled once, needs follow up if issue continuing and needs more refills.

## 2013-03-28 ENCOUNTER — Encounter: Payer: Self-pay | Admitting: Family Medicine

## 2013-03-28 ENCOUNTER — Ambulatory Visit (INDEPENDENT_AMBULATORY_CARE_PROVIDER_SITE_OTHER): Payer: PRIVATE HEALTH INSURANCE | Admitting: Family Medicine

## 2013-03-28 VITALS — BP 118/78 | HR 81 | Temp 97.7°F | Ht 68.0 in | Wt 205.2 lb

## 2013-03-28 DIAGNOSIS — Z0000 Encounter for general adult medical examination without abnormal findings: Secondary | ICD-10-CM

## 2013-03-28 DIAGNOSIS — R911 Solitary pulmonary nodule: Secondary | ICD-10-CM

## 2013-03-28 DIAGNOSIS — I10 Essential (primary) hypertension: Secondary | ICD-10-CM

## 2013-03-28 DIAGNOSIS — E039 Hypothyroidism, unspecified: Secondary | ICD-10-CM

## 2013-03-28 DIAGNOSIS — E7800 Pure hypercholesterolemia, unspecified: Secondary | ICD-10-CM

## 2013-03-28 DIAGNOSIS — E119 Type 2 diabetes mellitus without complications: Secondary | ICD-10-CM

## 2013-03-28 LAB — HM DIABETES FOOT EXAM

## 2013-03-28 LAB — TSH: TSH: 0.37 u[IU]/mL (ref 0.35–5.50)

## 2013-03-28 MED ORDER — HYDROCHLOROTHIAZIDE 25 MG PO TABS: 25.0000 mg | ORAL_TABLET | Freq: Every day | ORAL | Status: DC

## 2013-03-28 NOTE — Patient Instructions (Addendum)
Stop losartan HCTZ.  Change to HCTZ alone.  Follow BP at home, call if greater than 140/90 consistently.  Stop at lab on way out for thyroid test only.  Stop at fron t desk to set up follow up CT chest in several months.  Call Dr. Fuller Plan about colonoscopy when you are ready.  Follow up appt  HTN and  DM with labs prior in 3 months.

## 2013-03-28 NOTE — Assessment & Plan Note (Signed)
Well controlled. Continue current medication.  

## 2013-03-28 NOTE — Assessment & Plan Note (Signed)
Well controlled on no medication  

## 2013-03-28 NOTE — Addendum Note (Signed)
Addended by: Daralene Milch C on: 03/28/2013 11:47 AM   Modules accepted: Orders

## 2013-03-28 NOTE — Assessment & Plan Note (Signed)
SE to losartan.. Stop and change to HCTZ alone. Follow Bps at home.

## 2013-03-28 NOTE — Assessment & Plan Note (Signed)
Due for re-eval. 

## 2013-03-28 NOTE — Progress Notes (Signed)
Pre-visit discussion using our clinic review tool. No additional management support is needed unless otherwise documented below in the visit note.  

## 2013-03-28 NOTE — Progress Notes (Signed)
The patient is here for annual wellness exam and preventative care.  He reports that her has not had any energy since he has stated his new BP med.  He has no activity tolerance.  Diabetes: Well controlled on no med.  Lab Results  Component Value Date   HGBA1C 6.2 03/07/2013  Using medications without difficulties:  Hypoglycemic episodes:?  Hyperglycemic episodes:?  Feet problems:None  Blood Sugars averaging: not checking  eye exam within last year: 08/2012   Elevated Cholesterol: At goal on crestor  Lab Results  Component Value Date   CHOL 140 03/07/2013   HDL 43.80 03/07/2013   LDLCALC 81 03/07/2013   TRIG 75.0 03/07/2013   CHOLHDL 3 03/07/2013  Using medications without problems: None  Muscle aches: None  Diet compliance: Moderate  Exercise: walking occasionally  Other complaints:   Hypertension: Well controlled on  HCTZ/lisinopril BP Readings from Last 3 Encounters:  03/28/13 118/78  03/07/13 140/92  03/01/13 130/90   Using medication without problems or lightheadedness: Yes  Chest pain with exertion:Occ tightness, but STRESS TEST AND CARIDOLOGY EVAL low risk on 02/2013. He has been under a lot of stress.  Stable stress test 3-4 years ago.  Edema:None  Short of breath:None  Average home BPs:112/80 Other issues: CAD   Hypothyroid: On levothyroid.   Due for re-eval. Lab Results  Component Value Date   TSH 0.19* 09/17/2011   Insomnia.. Still ongoing. Trazodone 1-2 tabs did not help at all.  No depression, anxiety.   Review of Systems  Constitutional: Negative for fever, fatigue and unexpected weight change.  HENT: Negative for ear pain, congestion, sore throat, rhinorrhea, trouble swallowing, neck pain and postnasal drip.  Eyes: Negative for pain.  Respiratory: Negative for cough, shortness of breath and wheezing.  Cardiovascular: Positive for chest pain. Negative for palpitations and leg swelling.  Gastrointestinal: Negative for nausea, abdominal pain, diarrhea,  constipation and blood in stool.  Genitourinary: Negative for dysuria, urgency, hematuria, discharge, penile swelling, scrotal swelling, difficulty urinating, penile pain and testicular pain.  Skin: Negative for rash.  Neurological: Negative for syncope, weakness, light-headedness, numbness and headaches.  Psychiatric/Behavioral: Negative for behavioral problems and dysphoric mood. The patient is not nervous/anxious.  Objective:   Physical Exam  Constitutional: He appears well-developed and well-nourished. Non-toxic appearance. He does not appear ill. No distress.  HENT:  Head: Normocephalic and atraumatic.  Right Ear: Hearing, tympanic membrane, external ear and ear canal normal.  Left Ear: Hearing, tympanic membrane, external ear and ear canal normal.  Nose: Nose normal.  Mouth/Throat: Uvula is midline, oropharynx is clear and moist and mucous membranes are normal.  Eyes: Conjunctivae normal, EOM and lids are normal. Pupils are equal, round, and reactive to light. No foreign bodies found.  Neck: Trachea normal, normal range of motion and phonation normal. Neck supple. Carotid bruit is not present. No mass and no thyromegaly present.  Cardiovascular: Normal rate, regular rhythm, S1 normal, S2 normal, intact distal pulses and normal pulses. Exam reveals no gallop.  No murmur heard.  Pulmonary/Chest: Breath sounds normal. He has no wheezes. He has no rhonchi. He has no rales.  Abdominal: Soft. Normal appearance and bowel sounds are normal. There is no hepatosplenomegaly. There is no tenderness. There is no rebound, no guarding and no CVA tenderness. No hernia. Hernia confirmed negative in the right inguinal area and confirmed negative in the left inguinal area.  Genitourinary: Prostate normal, testes normal and penis normal. Rectal exam shows no external hemorrhoid, no internal hemorrhoid, no  fissure, no mass, no tenderness and anal tone normal. Guaiac negative stool. Prostate is not enlarged and  not tender. Right testis shows no mass and no tenderness. Left testis shows no mass and no tenderness. No paraphimosis or penile tenderness.  Lymphadenopathy:  He has no cervical adenopathy.  Right: No inguinal adenopathy present.  Left: No inguinal adenopathy present.  Neurological: He is alert. He has normal strength and normal reflexes. No cranial nerve deficit or sensory deficit. Gait normal.  Skin: Skin is warm, dry and intact. No rash noted.  Psychiatric: He has a normal mood and affect. His speech is normal and behavior is normal. Judgment normal.  Assessment & Plan:   The patient's preventative maintenance and recommended screening tests for an annual wellness exam were reviewed in full today.  Brought up to date unless services declined.  Counselled on the importance of diet, exercise, and its role in overall health and mortality.  The patient's FH and SH was reviewed, including their home life, tobacco status, and drug and alcohol status.   Vaccines Td uptodate, given flu vaccine in 2014 fall.  Colon: 2009, adenoma.. Likely repeat 5 years ie 06/2012, Dr. Fuller Plan  OVERDUE. Prostate: Stable PSA, rectal exam today.  Lab Results  Component Value Date   PSA 1.88 03/07/2013   PSA 1.80 03/07/2012   PSA 1.31 03/09/2011  Nonsmoker.

## 2013-03-30 ENCOUNTER — Telehealth: Payer: Self-pay

## 2013-03-30 MED ORDER — AMLODIPINE BESYLATE 5 MG PO TABS: 5.0000 mg | ORAL_TABLET | Freq: Every day | ORAL | Status: DC

## 2013-03-30 NOTE — Telephone Encounter (Signed)
Relevant patient education assigned to patient using Emmi. ° °

## 2013-03-30 NOTE — Telephone Encounter (Signed)
Pt seen 03/28/13. Pt started HCTZ on 03/29/13. On 03/29/13 BP was 142/102 and on 03/30/13 was 138/98. Pt has no H/A,dizziness or CP. Pt said no more SOB than when seen on 03/28/13. Please advise. Pt request cb.[facility].

## 2013-03-30 NOTE — Telephone Encounter (Signed)
Mr. Dames notified as instructed by telephone.  Prescription for Amlodopine 5 mg sent to [facility].

## 2013-03-30 NOTE — Telephone Encounter (Signed)
BP elevated on HCTZ alone. Recommend adding amlodipine 5 mg daily  #3-, 11 RF.. Call in please. Have pt call with measurements of BP in next 1-2 weeks.

## 2013-04-06 ENCOUNTER — Ambulatory Visit (INDEPENDENT_AMBULATORY_CARE_PROVIDER_SITE_OTHER)
Admission: RE | Admit: 2013-04-06 | Discharge: 2013-04-06 | Disposition: A | Discharge status: Home / Self Care | Payer: PRIVATE HEALTH INSURANCE | Source: Ambulatory Visit | Attending: Family Medicine | Admitting: Family Medicine

## 2013-04-06 DIAGNOSIS — R911 Solitary pulmonary nodule: Secondary | ICD-10-CM

## 2013-04-06 MED ORDER — IOHEXOL 300 MG/ML  SOLN: 100.0000 mL | Freq: Once | INTRAMUSCULAR | Status: AC | PRN

## 2013-04-24 ENCOUNTER — Encounter: Payer: Self-pay | Admitting: Family Medicine

## 2013-04-24 ENCOUNTER — Ambulatory Visit (INDEPENDENT_AMBULATORY_CARE_PROVIDER_SITE_OTHER): Payer: PRIVATE HEALTH INSURANCE | Admitting: Family Medicine

## 2013-04-24 VITALS — BP 122/80 | HR 91 | Temp 97.9°F | Wt 208.2 lb

## 2013-04-24 DIAGNOSIS — J019 Acute sinusitis, unspecified: Secondary | ICD-10-CM

## 2013-04-24 MED ORDER — FLUTICASONE PROPIONATE 50 MCG/ACT NA SUSP: 2.0000 | Freq: Every day | NASAL | Status: DC

## 2013-04-24 MED ORDER — AMOXICILLIN-POT CLAVULANATE 875-125 MG PO TABS: 1.0000 | ORAL_TABLET | Freq: Two times a day (BID) | ORAL | Status: DC

## 2013-04-24 NOTE — Progress Notes (Signed)
Pre visit review using our clinic review tool, if applicable. No additional management support is needed unless otherwise documented below in the visit note.  duration of symptoms: 3 weeks.  Started with a "head cold" now with discolored rhinorrhea.  Sinus pressure.  Aches, diffuse.  Some better with advil.  No vomiting.  No diarrhea.  No ear pain.  Some post nasal gtt and some resulting cough.    ROS: See HPI.  Otherwise negative.  ,   Meds, vitals, and allergies reviewed.   GEN: nad, alert and oriented HEENT: mucous membranes moist, TM w/o erythema, nasal epithelium injected, OP with cobblestoning, max sinuses ttp.  NECK: supple w/o LA CV: rrr. PULM: ctab, no inc wob ABD: soft, +bs EXT: no edema

## 2013-04-24 NOTE — Patient Instructions (Signed)
Start the antibiotics today and use the flonase for the congestion.  Drink plenty of fluids, take tylenol as needed, and gargle with warm salt water for your throat.  This should gradually improve.  Take care.  Let us know if you have other concerns.

## 2013-04-24 NOTE — Assessment & Plan Note (Signed)
Nontoxic, ctab.  Augmentin, flonase, supportive care.  F/u prn.  D/w pt, routine cautions on meds.

## 2013-05-13 ENCOUNTER — Other Ambulatory Visit: Payer: Self-pay | Admitting: Family Medicine

## 2013-05-23 ENCOUNTER — Ambulatory Visit (INDEPENDENT_AMBULATORY_CARE_PROVIDER_SITE_OTHER): Payer: PRIVATE HEALTH INSURANCE | Admitting: Family Medicine

## 2013-05-23 ENCOUNTER — Encounter: Payer: Self-pay | Admitting: Family Medicine

## 2013-05-23 VITALS — BP 132/80 | HR 71 | Temp 98.3°F | Ht 68.0 in | Wt 206.2 lb

## 2013-05-23 DIAGNOSIS — M545 Low back pain, unspecified: Secondary | ICD-10-CM | POA: Insufficient documentation

## 2013-05-23 MED ORDER — CYCLOBENZAPRINE HCL 10 MG PO TABS: 10.0000 mg | ORAL_TABLET | Freq: Three times a day (TID) | ORAL | Status: DC | PRN

## 2013-05-23 NOTE — Progress Notes (Signed)
Pre visit review using our clinic review tool, if applicable. No additional management support is needed unless otherwise documented below in the visit note. 

## 2013-05-23 NOTE — Progress Notes (Signed)
Subjective:    Patient ID: Brett Lowery, male    DOB: 05-Mar-1955, 58 y.o.   MRN: [id number]  HPI Here with back pain   This just began 2 weeks ago - he bent over and had sudden low back pain (picking up a limb-nothing heavy) Hurts the worst to sit  Standing and walking are better  Lying down is fair  On the R low back area A little radiation down his leg  No numbness and no weakness   He has used ben gay and aleve  Not very helpful   Patient Active Problem List   Diagnosis Date Noted  . Acute sinusitis 04/24/2013  . Chest pain 02/27/2013  . DOE (dyspnea on exertion) 02/27/2013  . Degenerative disk disease 01/01/2013  . Chronic neck pain 09/28/2012  . Tinnitus of both ears 09/28/2012  . Insomnia 09/17/2011  . Pulmonary nodule seen on imaging study 09/17/2011  . BENIGN PROSTATIC HYPERTROPHY, HX OF 12/17/2009  . CHRONIC RHINITIS 12/18/2008  . DYSLIPIDEMIA 07/10/2008  . DIABETES MELLITUS, WITHOUT COMPLICATIONS 24/58/0998  . CAD 04/25/2007  . HYPOTHYROIDISM 06/15/2006  . HYPERCHOLESTEROLEMIA 06/15/2006  . HYPERTENSION 06/15/2006  . GERD 06/15/2006   Past Medical History  Diagnosis Date  . HTN (hypertension)   . CAD (coronary artery disease)   . Dyslipidemia   . Chest pain   . DM (diabetes mellitus)     w/o complications  . Well adult exam   . Special screening for malignant neoplasm of prostate   . Special screening for malignant neoplasms, colon   . Insomnia   . Disorders of bursae and tendons in shoulder region, unspecified     R, tendonitis  . GERD (gastroesophageal reflux disease)   . Hypothyroidism   . Hypercholesterolemia    Past Surgical History  Procedure Laterality Date  . Thyroidectomy  2004    thyroid nodile  . Cornia transplants  2003  . Hernia repair  2002  . Dg esophagus -ba sw  2005    mod reflux and hiatal hernia  . Cardiac catheterization  2009    shoed no obstructive CAD, medical management    History  Substance Use Topics  .  Smoking status: Former Smoker    Quit date: 03/28/2001  . Smokeless tobacco: Never Used     Comment: 40 pack year hx   . Alcohol Use: No   Family History  Problem Relation Age of Onset  . Heart attack Neg Hx     < age 58   No Known Allergies Current Outpatient Prescriptions on File Prior to Visit  Medication Sig Dispense Refill  . amLODipine (NORVASC) 5 MG tablet Take 1 tablet (5 mg total) by mouth daily.  90 tablet  3  . aspirin 81 MG tablet Take 81 mg by mouth daily.        . CRESTOR 20 MG tablet TAKE 1 TABLET EVERY DAY  30 tablet  5  . fluticasone (FLONASE) 50 MCG/ACT nasal spray Place 2 sprays into both nostrils daily.  16 g  0  . hydrochlorothiazide (HYDRODIURIL) 25 MG tablet Take 1 tablet (25 mg total) by mouth daily.  90 tablet  3  . lansoprazole (PREVACID) 30 MG capsule TAKE ONE CAPSULE BY MOUTH DAILY  30 capsule  5  . SYNTHROID 137 MCG tablet TAKE 1 TABLET BY MOUTH EVERY DAY  30 tablet  11  . tamsulosin (FLOMAX) 0.4 MG CAPS TAKE ONE CAPSULE EVERY DAY  30 capsule  6   No  current facility-administered medications on file prior to visit.      Review of Systems Review of Systems  Constitutional: Negative for fever, appetite change, fatigue and unexpected weight change.  Eyes: Negative for pain and visual disturbance.  Respiratory: Negative for cough and shortness of breath.   Cardiovascular: Negative for cp or palpitations    Gastrointestinal: Negative for nausea, diarrhea and constipation.  Genitourinary: Negative for urgency and frequency.  Skin: Negative for pallor or rash   MSK pos for back pain / neg for joint swelling  Neurological: Negative for weakness, light-headedness, numbness and headaches.  Hematological: Negative for adenopathy. Does not bruise/bleed easily.  Psychiatric/Behavioral: Negative for dysphoric mood. The patient is not nervous/anxious.         Objective:   Physical Exam  Constitutional: He appears well-developed and well-nourished. No  distress.  Eyes: Conjunctivae and EOM are normal. Pupils are equal, round, and reactive to light.  Neck: Normal range of motion. Neck supple.  Cardiovascular: Normal rate and regular rhythm.   Musculoskeletal: Normal range of motion. He exhibits tenderness. He exhibits no edema.       Lumbar back: He exhibits tenderness and spasm. He exhibits normal range of motion, no bony tenderness, no swelling, no edema and no deformity.  Tender over R para lumbar musculature  Nl rom with some pain on full R flexion  Nl gait  No neuro changes   Neurological: He has normal strength. No sensory deficit. He exhibits normal muscle tone. Gait normal.  Skin: Skin is warm and dry. No rash noted.  Psychiatric: He has a normal mood and affect.          Assessment & Plan:

## 2013-05-23 NOTE — Patient Instructions (Signed)
For back pain - use some heat 10 minutes at a time whenever you can  Walk as much as you can Take frequent breaks to walk around for your long drive  Aleve 2 pills with food up to twice daily  Try the flexeril (when not driving because it may sedate)- up to three times daily  Do not bend and twist at the same time    Back Injury Prevention Back injuries can be extremely painful and difficult to heal. After having one back injury, you are much more likely to experience another later on. It is important to learn how to avoid injuring or re-injuring your back. The following tips can help you to prevent a back injury. PHYSICAL FITNESS  Exercise regularly and try to develop good tone in your abdominal muscles. Your abdominal muscles provide a lot of the support needed by your back.  Do aerobic exercises (walking, jogging, biking, swimming) regularly.  Do exercises that increase balance and strength (tai chi, yoga) regularly. This can decrease your risk of falling and injuring your back.  Stretch before and after exercising.  Maintain a healthy weight. The more you weigh, the more stress is placed on your back. For every pound of weight, 10 times that amount of pressure is placed on the back. DIET  Talk to your caregiver about how much calcium and vitamin D you need per day. These nutrients help to prevent weakening of the bones (osteoporosis). Osteoporosis can cause broken (fractured) bones that lead to back pain.  Include good sources of calcium in your diet, such as dairy products, green, leafy vegetables, and products with calcium added (fortified).  Include good sources of vitamin D in your diet, such as milk and foods that are fortified with vitamin D.  Consider taking a nutritional supplement or a multivitamin if needed.  Stop smoking if you smoke. POSTURE  Sit and stand up straight. Avoid leaning forward when you sit or hunching over when you stand.  Choose chairs with good low  back (lumbar) support.  If you work at a desk, sit close to your work so you do not need to lean over. Keep your chin tucked in. Keep your neck drawn back and elbows bent at a right angle. Your arms should look like the letter "L."  Sit high and close to the steering wheel when you drive. Add a lumbar support to your car seat if needed.  Avoid sitting or standing in one position for too long. Take breaks to get up, stretch, and walk around at least once every hour. Take breaks if you are driving for long periods of time.  Sleep on your side with your knees slightly bent, or sleep on your back with a pillow under your knees. Do not sleep on your stomach. LIFTING, TWISTING, AND REACHING  Avoid heavy lifting, especially repetitive lifting. If you must do heavy lifting:  Stretch before lifting.  Work slowly.  Rest between lifts.  Use carts and dollies to move objects when possible.  Make several small trips instead of carrying 1 heavy load.  Ask for help when you need it.  Ask for help when moving big, awkward objects.  Follow these steps when lifting:  Stand with your feet shoulder-width apart.  Get as close to the object as you can. Do not try to pick up heavy objects that are far from your body.  Use handles or lifting straps if they are available.  Bend at your knees. Squat down, but  keep your heels off the floor.  Keep your shoulders pulled back, your chin tucked in, and your back straight.  Lift the object slowly, tightening the muscles in your legs, abdomen, and buttocks. Keep the object as close to the center of your body as possible.  When you put a load down, use these same guidelines in reverse.  Do not:  Lift the object above your waist.  Twist at the waist while lifting or carrying a load. Move your feet if you need to turn, not your waist.  Bend over without bending at your knees.  Avoid reaching over your head, across a table, or for an object on a high  surface. OTHER TIPS  Avoid wet floors and keep sidewalks clear of ice to prevent falls.  Do not sleep on a mattress that is too soft or too hard.  Keep items that are used frequently within easy reach.  Put heavier objects on shelves at waist level and lighter objects on lower or higher shelves.  Find ways to decrease your stress, such as exercise, massage, or relaxation techniques. Stress can build up in your muscles. Tense muscles are more vulnerable to injury.  Seek treatment for depression or anxiety if needed. These conditions can increase your risk of developing back pain. SEEK MEDICAL CARE IF:  You injure your back.  You have questions about diet, exercise, or other ways to prevent back injuries. MAKE SURE YOU:  Understand these instructions.  Will watch your condition.  Will get help right away if you are not doing well or get worse. Document Released: 03/18/2004 Document Revised: 05/03/2011 Document Reviewed: 03/22/2011 [facility] Patient Information 2014 [place], [place].

## 2013-06-06 ENCOUNTER — Other Ambulatory Visit: Payer: Self-pay | Admitting: Family Medicine

## 2013-06-10 ENCOUNTER — Other Ambulatory Visit: Payer: Self-pay | Admitting: Family Medicine

## 2013-08-08 ENCOUNTER — Other Ambulatory Visit: Payer: Self-pay | Admitting: Family Medicine

## 2013-08-17 IMAGING — CR DG SHOULDER 2+V*R*
3 series · 3 of 3 positions shown · non-contrast
Comparison: None.

CLINICAL DATA: Fall, left shoulder pain.  Shoulder pain.

RIGHT SHOULDER - 2+ VIEW

[w shoulder ap internal righ]
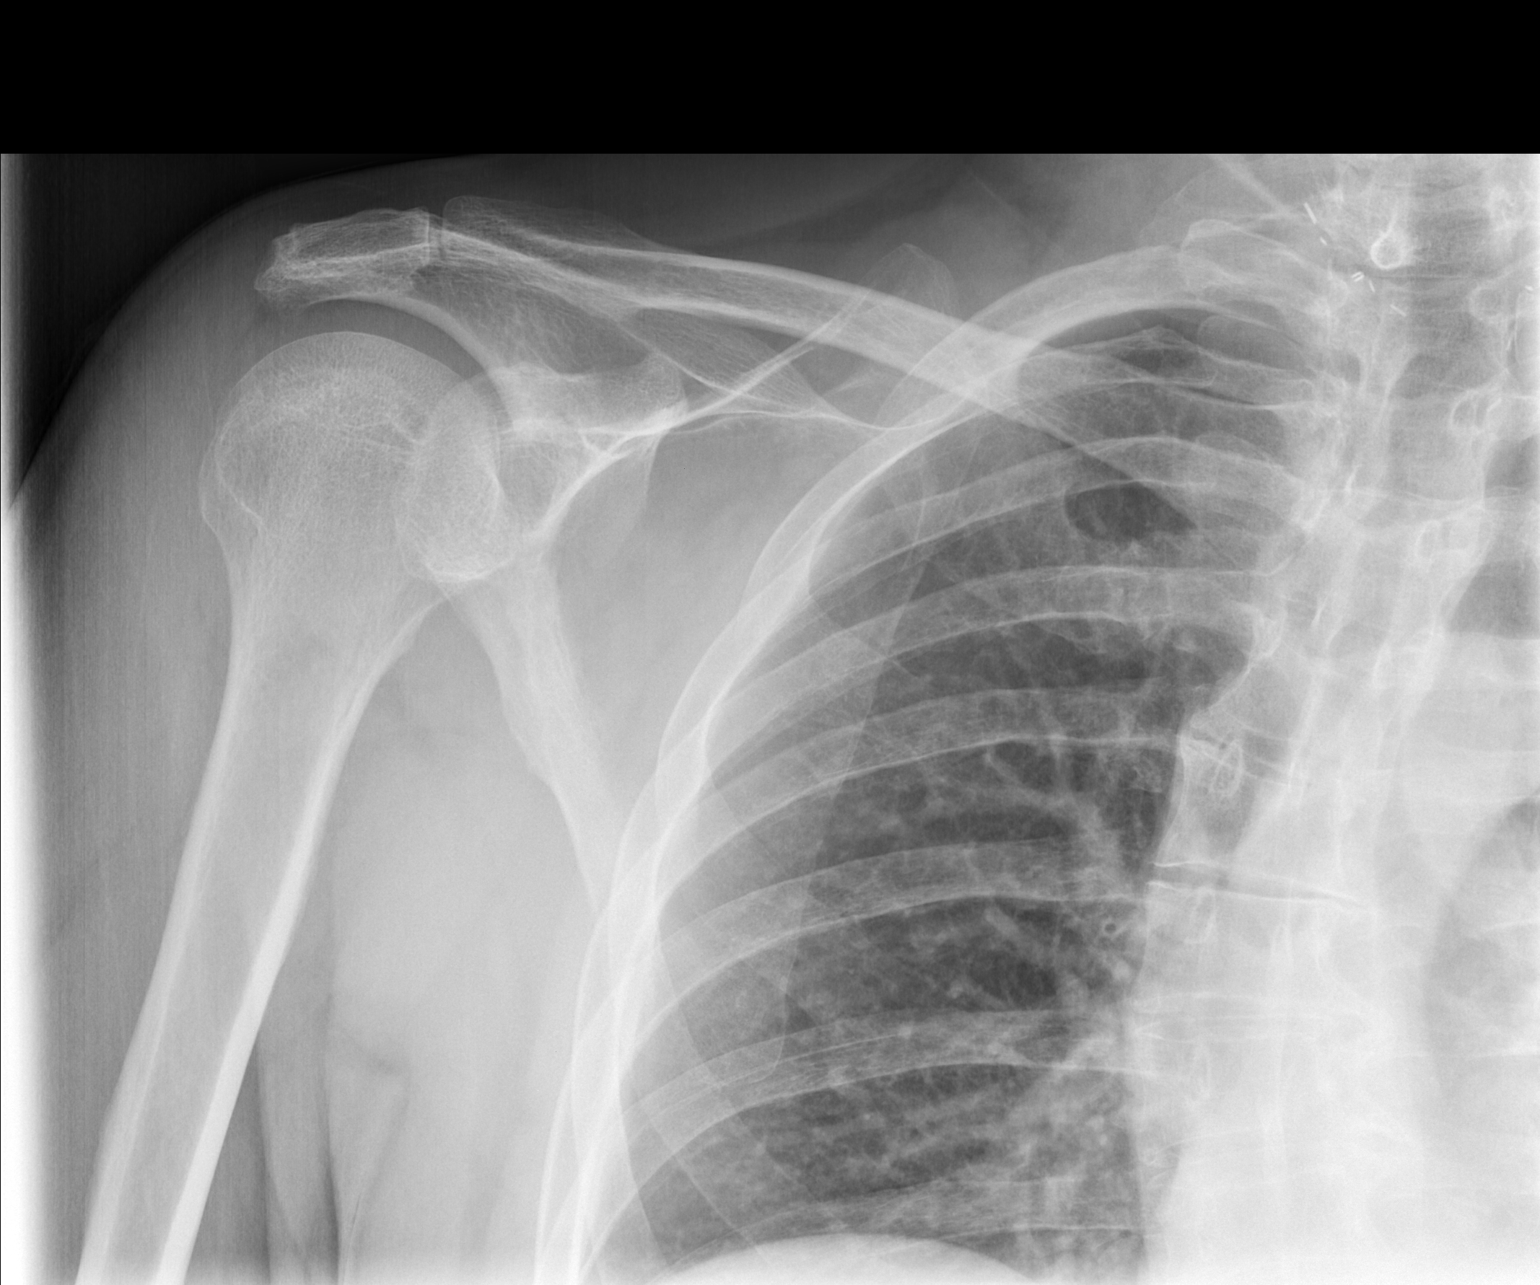

[w shoulder ap external righ]
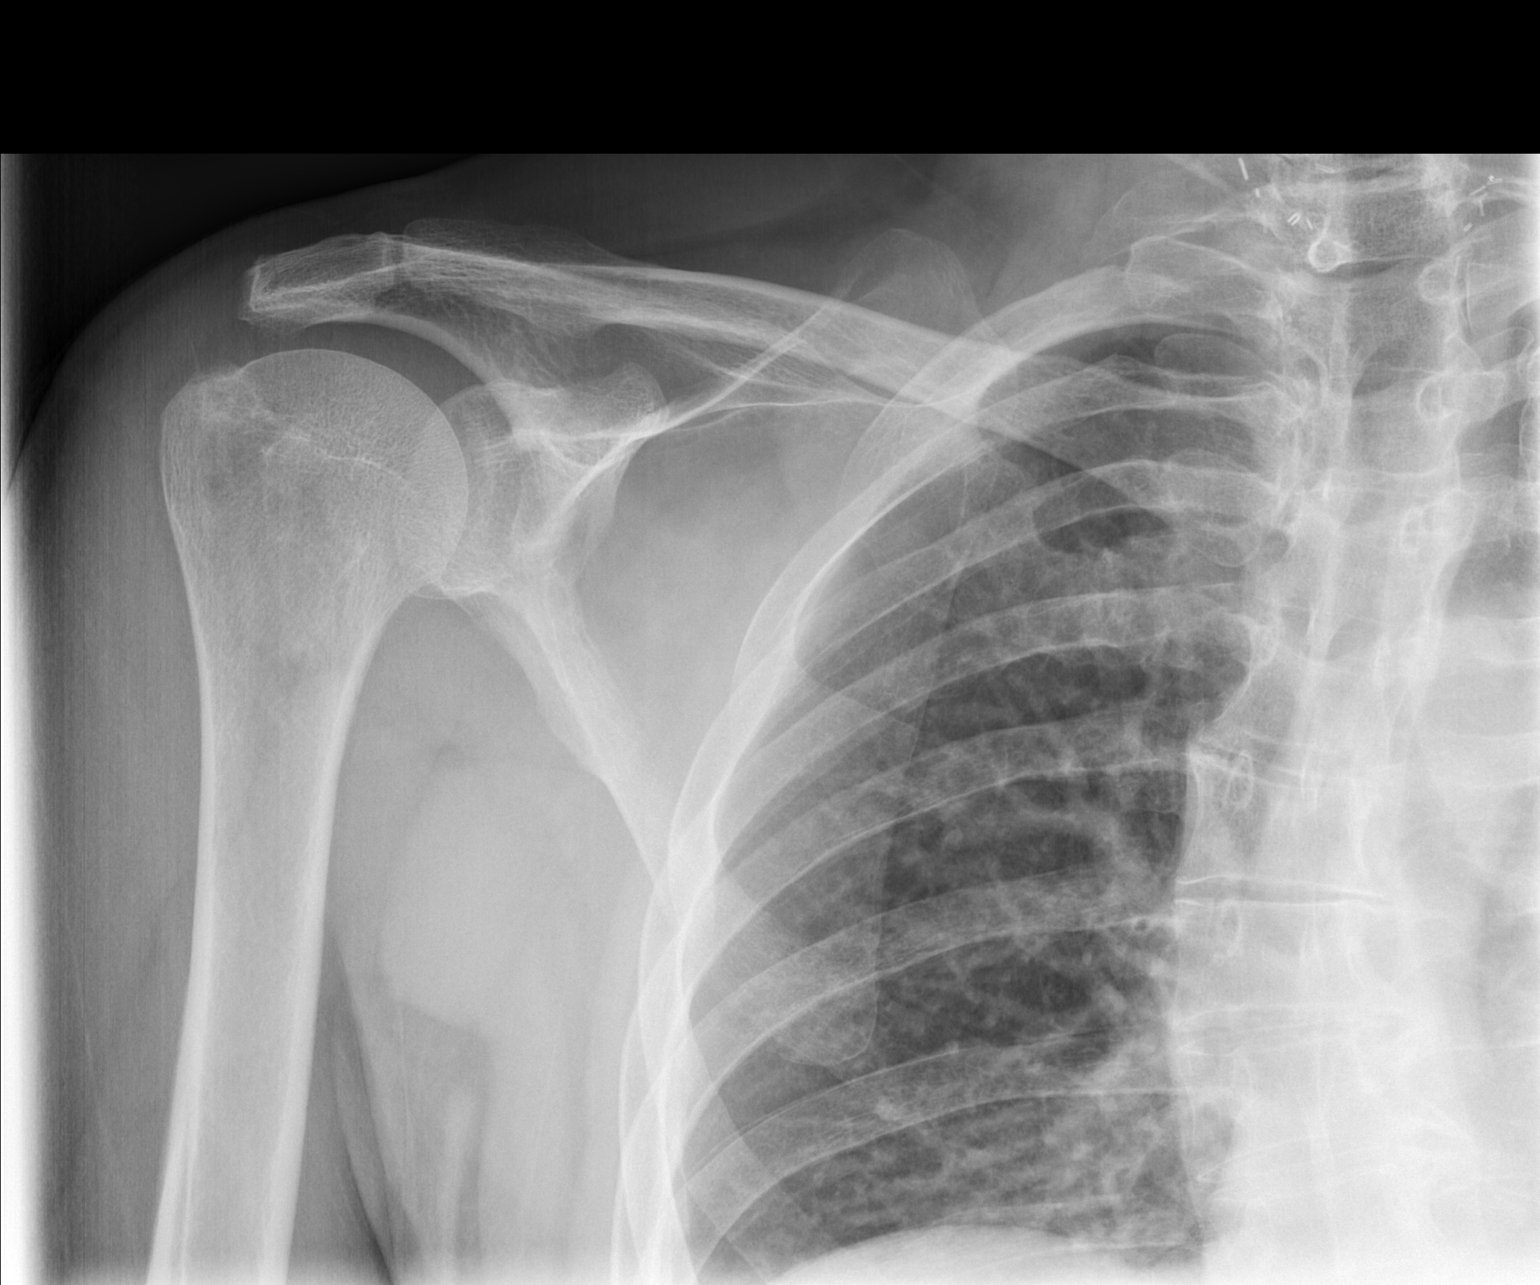

[w shoulder y view right]
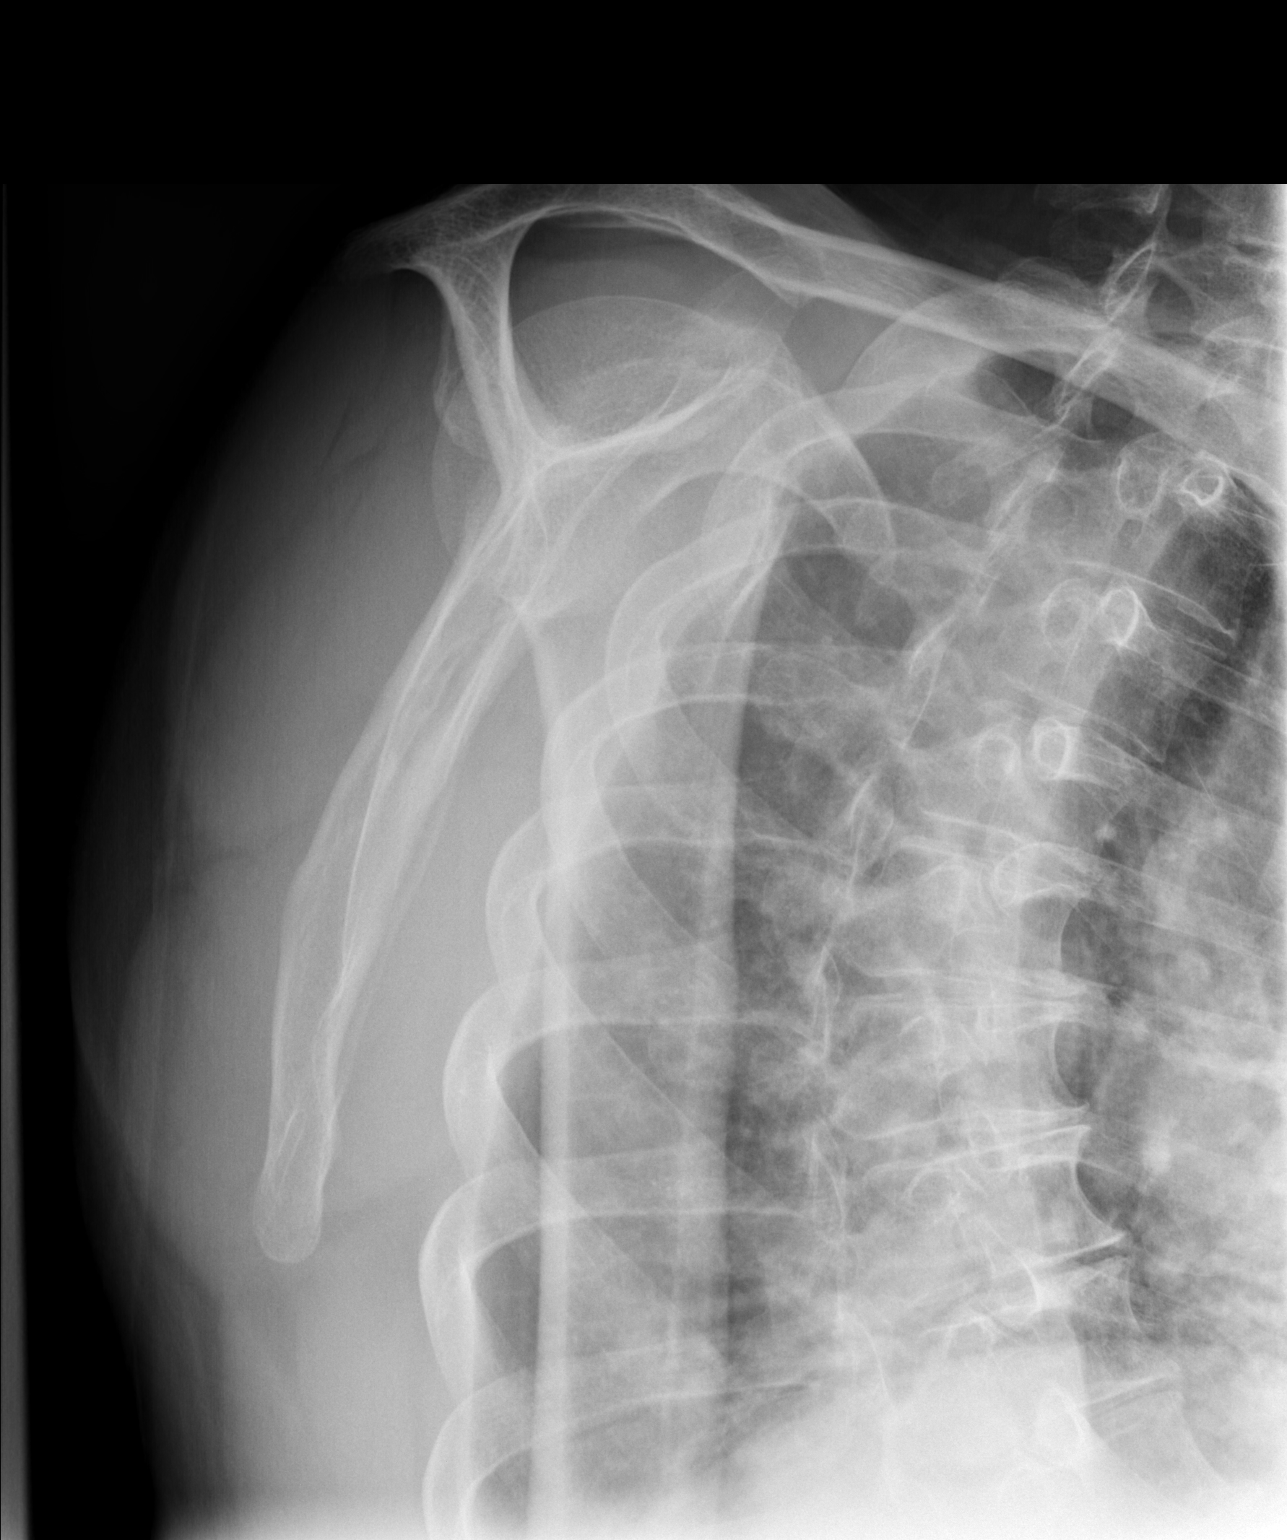

[3 of 3 positions shown; findings below may reference images not displayed]

FINDINGS: No acute bony abnormality.  Specifically, no fracture,
subluxation, or dislocation.  Soft tissues are intact.
IMPRESSION: No acute bony abnormality.

## 2013-12-25 ENCOUNTER — Ambulatory Visit (INDEPENDENT_AMBULATORY_CARE_PROVIDER_SITE_OTHER): Payer: PRIVATE HEALTH INSURANCE | Admitting: Family Medicine

## 2013-12-25 ENCOUNTER — Encounter: Payer: Self-pay | Admitting: Family Medicine

## 2013-12-25 VITALS — BP 114/70 | HR 75 | Temp 97.9°F | Ht 68.0 in | Wt 210.2 lb

## 2013-12-25 DIAGNOSIS — M5030 Other cervical disc degeneration, unspecified cervical region: Secondary | ICD-10-CM

## 2013-12-25 DIAGNOSIS — Z23 Encounter for immunization: Secondary | ICD-10-CM

## 2013-12-25 MED ORDER — FLUTICASONE PROPIONATE 50 MCG/ACT NA SUSP: NASAL | Status: DC

## 2013-12-25 MED ORDER — DICLOFENAC SODIUM 75 MG PO TBEC: 75.0000 mg | DELAYED_RELEASE_TABLET | Freq: Two times a day (BID) | ORAL | Status: DC

## 2013-12-25 NOTE — Progress Notes (Signed)
Pre visit review using our clinic review tool, if applicable. No additional management support is needed unless otherwise documented below in the visit note. 

## 2013-12-25 NOTE — Assessment & Plan Note (Signed)
No sign of herniated disc  Or muscle spasm on exam. Most likely neck pain secondary arthritis.   treat with NSIAD, home PT. If not improving consider referral for ESI.

## 2013-12-25 NOTE — Progress Notes (Signed)
   Subjective:    Patient ID: Brett Lowery, male    DOB: 1955/06/26, 58 y.o.   MRN: [id number]  HPI    58 year old male with history of chronic neck pain presents with acute flare of neck pain. Ongoing in last 2 years. Worse in last 3-4 months. Pain in B  Sides of neck and centrally, no radiation to arms. No weakness or numbness. No fever. No loss of control of urine or BMs. . Using pillows at night to helps with pain. Heat. Advil 600 mg at night to help sleep at night. Helps minimmaly. Mobic did not help much in past. Has never had epidural.     Seen in 12/2012 with neck pain by Dr. Loletha Grayer. Referred to PT at that time.  8/7/2014CERVICAL SPINE - COMPLETE 4+ VIEW Comparison: None. Findings: Frontal, lateral, open mouth odontoid, and bilateral oblique views were obtained. There is no fracture or spondylolisthesis. Prevertebral soft tissues and predental space regions are normal. There is moderately severe disc space narrowing at C3-4, C4-5, C5- 6, C6-7. There are anterior osteophytes at C3, C4, C5, C6, and C7. There is exit foraminal narrowing due to facet hypertrophy at C4-5 bilaterally, C5-6 on the right, and C6-7 bilaterally. No erosive change. There are small cervical ribs bilaterally. There is upper thoracic dextroscoliosis. IMPRESSION: Multilevel osteoarthritic change. No fracture or spondylolisthesis. Small cervical ribs bilaterally. Upper thoracic dextroscoliosis.   No past surgical history.    Review of Systems  Constitutional: Negative for fever and fatigue.  HENT: Negative for ear pain.   Eyes: Negative for pain.  Respiratory: Negative for shortness of breath.   Cardiovascular: Negative for chest pain.  Gastrointestinal: Negative for abdominal pain.       Objective:   Physical Exam  Constitutional: Vital signs are normal. He appears well-developed and well-nourished.  HENT:  Head: Normocephalic.  Right Ear: Hearing normal.  Left Ear: Hearing normal.    Nose: Nose normal.  Mouth/Throat: Oropharynx is clear and moist and mucous membranes are normal.  Neck: Trachea normal. Carotid bruit is not present. No thyroid mass and no thyromegaly present.  Cardiovascular: Normal rate, regular rhythm and normal pulses.  Exam reveals no gallop, no distant heart sounds and no friction rub.   No murmur heard. No peripheral edema  Pulmonary/Chest: Effort normal and breath sounds normal. No respiratory distress.  Musculoskeletal:       Cervical back: He exhibits decreased range of motion. He exhibits no tenderness and no bony tenderness.  No muscle spasm.  Grinding and pain only with neck movement.  Skin: Skin is warm, dry and intact. No rash noted.  Psychiatric: He has a normal mood and affect. His speech is normal and behavior is normal. Thought content normal.          Assessment & Plan:

## 2013-12-25 NOTE — Patient Instructions (Addendum)
Start home PT exercise. Start diclofenac twice daily for inflammation. No aleve or ibuprofen when on. Try using daily for 1-2 weeks then go to as needed. Repeat if flare. Call if not improving as expected. Call to schedule colonoscopy on your own. Dr. Fuller Plan [phone])

## 2014-02-08 ENCOUNTER — Other Ambulatory Visit: Payer: Self-pay | Admitting: Family Medicine

## 2014-02-12 ENCOUNTER — Telehealth: Payer: Self-pay

## 2014-02-12 ENCOUNTER — Other Ambulatory Visit: Payer: Self-pay | Admitting: Family Medicine

## 2014-02-12 NOTE — Telephone Encounter (Signed)
Pt left v/m requesting cb; spoke with pt and he wants refill on diclofenac; spoke with Apolonio Schneiders at [facility] and rx ready for pick up. Pt advised and he will ck with pharmacy.

## 2014-02-12 NOTE — Telephone Encounter (Signed)
Pt left v/m requesting refill of med for arthritis pain. Left v/m requesting pt to cb to get name of med and pharmacy.

## 2014-02-12 NOTE — Telephone Encounter (Signed)
Last office visit 12/25/2013.  Last refilled 12/25/2013 for #60 with no refills.  Ok to refill?

## 2014-03-07 ENCOUNTER — Telehealth: Payer: Self-pay | Admitting: Family Medicine

## 2014-03-07 DIAGNOSIS — E119 Type 2 diabetes mellitus without complications: Secondary | ICD-10-CM

## 2014-03-07 DIAGNOSIS — Z125 Encounter for screening for malignant neoplasm of prostate: Secondary | ICD-10-CM

## 2014-03-07 DIAGNOSIS — E039 Hypothyroidism, unspecified: Secondary | ICD-10-CM

## 2014-03-07 DIAGNOSIS — E7800 Pure hypercholesterolemia, unspecified: Secondary | ICD-10-CM

## 2014-03-07 NOTE — Telephone Encounter (Signed)
Done

## 2014-03-07 NOTE — Telephone Encounter (Signed)
Pt has scheduled his CPE + LABS in March and wants to request that when he comes in for his labs that they check his PSA as well.

## 2014-03-22 ENCOUNTER — Other Ambulatory Visit: Payer: Self-pay | Admitting: Family Medicine

## 2014-04-13 ENCOUNTER — Encounter: Payer: Self-pay | Admitting: Gastroenterology

## 2014-04-16 ENCOUNTER — Other Ambulatory Visit: Payer: Self-pay | Admitting: Family Medicine

## 2014-04-16 NOTE — Telephone Encounter (Signed)
Last office visit 12/25/2013.  Last refilled 02/12/2014 for #60 with no refills.  Ok to refill?

## 2014-04-19 LAB — HM DIABETES EYE EXAM

## 2014-04-26 ENCOUNTER — Other Ambulatory Visit: Payer: Self-pay

## 2014-04-26 ENCOUNTER — Other Ambulatory Visit (INDEPENDENT_AMBULATORY_CARE_PROVIDER_SITE_OTHER): Payer: PRIVATE HEALTH INSURANCE

## 2014-04-26 DIAGNOSIS — Z125 Encounter for screening for malignant neoplasm of prostate: Secondary | ICD-10-CM

## 2014-04-26 DIAGNOSIS — E039 Hypothyroidism, unspecified: Secondary | ICD-10-CM

## 2014-04-26 DIAGNOSIS — E119 Type 2 diabetes mellitus without complications: Secondary | ICD-10-CM

## 2014-04-26 DIAGNOSIS — E7800 Pure hypercholesterolemia, unspecified: Secondary | ICD-10-CM

## 2014-04-26 LAB — HEMOGLOBIN A1C: HEMOGLOBIN A1C: 6.5 % (ref 4.6–6.5)

## 2014-04-26 LAB — COMPREHENSIVE METABOLIC PANEL
ALBUMIN: 4.5 g/dL (ref 3.5–5.2)
ALT: 48 U/L (ref 0–53)
AST: 27 U/L (ref 0–37)
Alkaline Phosphatase: 72 U/L (ref 39–117)
BUN: 22 mg/dL (ref 6–23)
CHLORIDE: 100 meq/L (ref 96–112)
CO2: 36 meq/L — AB (ref 19–32)
CREATININE: 1.29 mg/dL (ref 0.40–1.50)
Calcium: 9.6 mg/dL (ref 8.4–10.5)
GFR: 60.96 mL/min (ref >60.00)
Glucose, Bld: 105 mg/dL — ABNORMAL HIGH (ref 70–99)
POTASSIUM: 4 meq/L (ref 3.5–5.1)
Sodium: 140 mEq/L (ref 135–145)
Total Bilirubin: 0.8 mg/dL (ref 0.2–1.2)
Total Protein: 7 g/dL (ref 6.0–8.3)

## 2014-04-26 LAB — PSA: PSA: 1.57 ng/mL (ref 0.10–4.00)

## 2014-04-26 LAB — MICROALBUMIN / CREATININE URINE RATIO
Creatinine,U: 211.7 mg/dL
Microalb Creat Ratio: 0.6 mg/g (ref 0.0–30.0)
Microalb, Ur: 1.2 mg/dL (ref 0.0–1.9)

## 2014-04-26 LAB — LIPID PANEL
CHOL/HDL RATIO: 6
Cholesterol: 215 mg/dL — ABNORMAL HIGH (ref 0–200)
HDL: 37.7 mg/dL — ABNORMAL LOW (ref >39.00)
LDL CALC: 143 mg/dL — AB (ref 0–99)
NonHDL: 177.3
Triglycerides: 171 mg/dL — ABNORMAL HIGH (ref 0.0–149.0)
VLDL: 34.2 mg/dL (ref 0.0–40.0)

## 2014-04-26 LAB — TSH: TSH: 0.25 u[IU]/mL — ABNORMAL LOW (ref 0.35–4.50)

## 2014-04-29 ENCOUNTER — Other Ambulatory Visit (INDEPENDENT_AMBULATORY_CARE_PROVIDER_SITE_OTHER): Payer: PRIVATE HEALTH INSURANCE

## 2014-04-29 DIAGNOSIS — E039 Hypothyroidism, unspecified: Secondary | ICD-10-CM

## 2014-04-29 LAB — T3, FREE: T3, Free: 4 pg/mL (ref 2.3–4.2)

## 2014-04-29 LAB — T4, FREE: Free T4: 1.72 ng/dL — ABNORMAL HIGH (ref 0.60–1.60)

## 2014-05-03 ENCOUNTER — Encounter: Payer: Self-pay | Admitting: Family Medicine

## 2014-05-03 ENCOUNTER — Ambulatory Visit (INDEPENDENT_AMBULATORY_CARE_PROVIDER_SITE_OTHER): Payer: PRIVATE HEALTH INSURANCE | Admitting: Family Medicine

## 2014-05-03 VITALS — BP 126/86 | HR 75 | Temp 97.7°F | Ht 67.25 in | Wt 214.5 lb

## 2014-05-03 DIAGNOSIS — I10 Essential (primary) hypertension: Secondary | ICD-10-CM

## 2014-05-03 DIAGNOSIS — Z0000 Encounter for general adult medical examination without abnormal findings: Secondary | ICD-10-CM

## 2014-05-03 DIAGNOSIS — R079 Chest pain, unspecified: Secondary | ICD-10-CM

## 2014-05-03 DIAGNOSIS — E119 Type 2 diabetes mellitus without complications: Secondary | ICD-10-CM

## 2014-05-03 DIAGNOSIS — E890 Postprocedural hypothyroidism: Secondary | ICD-10-CM

## 2014-05-03 LAB — HM DIABETES FOOT EXAM

## 2014-05-03 MED ORDER — PANTOPRAZOLE SODIUM 40 MG PO TBEC: 40.0000 mg | DELAYED_RELEASE_TABLET | Freq: Every day | ORAL | Status: DC

## 2014-05-03 MED ORDER — LEVOTHYROXINE SODIUM 125 MCG PO TABS: 125.0000 ug | ORAL_TABLET | Freq: Every day | ORAL | Status: DC

## 2014-05-03 NOTE — Progress Notes (Signed)
Pre visit review using our clinic review tool, if applicable. No additional management support is needed unless otherwise documented below in the visit note. 

## 2014-05-03 NOTE — Patient Instructions (Addendum)
Stop prevacid. Start pantoprazole 40 mg daily. Work on low cholesterol diet increase exercise. Return for lab only visit for cholesterol check in 3 months. Follow  up in 6 months for DM check. Decrease levo to 125 mcg daily. Look into pneumonia vaccine. Call to set up referral to Dr. Fuller Plan for repeat colonoscopy.    Fat and Cholesterol Control Diet Fat and cholesterol levels in your blood and organs are influenced by your diet. High levels of fat and cholesterol may lead to diseases of the heart, small and large blood vessels, gallbladder, liver, and pancreas. CONTROLLING FAT AND CHOLESTEROL WITH DIET Although exercise and lifestyle factors are important, your diet is key. That is because certain foods are known to raise cholesterol and others to lower it. The goal is to balance foods for their effect on cholesterol and more importantly, to replace saturated and trans fat with other types of fat, such as monounsaturated fat, polyunsaturated fat, and omega-3 fatty acids. On average, a person should consume no more than 15 to 17 g of saturated fat daily. Saturated and trans fats are considered "bad" fats, and they will raise LDL cholesterol. Saturated fats are primarily found in animal products such as meats, butter, and cream. However, that does not mean you need to give up all your favorite foods. Today, there are good tasting, low-fat, low-cholesterol substitutes for most of the things you like to eat. Choose low-fat or nonfat alternatives. Choose round or loin cuts of red meat. These types of cuts are lowest in fat and cholesterol. Chicken (without the skin), fish, veal, and ground [place] breast are great choices. Eliminate fatty meats, such as hot dogs and salami. Even shellfish have little or no saturated fat. Have a 3 oz (85 g) portion when you eat lean meat, poultry, or fish. Trans fats are also called "partially hydrogenated oils." They are oils that have been scientifically manipulated so that  they are solid at room temperature resulting in a longer shelf life and improved taste and texture of foods in which they are added. Trans fats are found in stick margarine, some tub margarines, cookies, crackers, and baked goods.  When baking and cooking, oils are a great substitute for butter. The monounsaturated oils are especially beneficial since it is believed they lower LDL and raise HDL. The oils you should avoid entirely are saturated tropical oils, such as coconut and palm.  Remember to eat a lot from food groups that are naturally free of saturated and trans fat, including fish, fruit, vegetables, beans, grains (barley, rice, couscous, bulgur wheat), and pasta (without cream sauces).  IDENTIFYING FOODS THAT LOWER FAT AND CHOLESTEROL  Soluble fiber may lower your cholesterol. This type of fiber is found in fruits such as apples, vegetables such as broccoli, potatoes, and carrots, legumes such as beans, peas, and lentils, and grains such as barley. Foods fortified with plant sterols (phytosterol) may also lower cholesterol. You should eat at least 2 g per day of these foods for a cholesterol lowering effect.  Read package labels to identify low-saturated fats, trans fat free, and low-fat foods at the supermarket. Select cheeses that have only 2 to 3 g saturated fat per ounce. Use a heart-healthy tub margarine that is free of trans fats or partially hydrogenated oil. When buying baked goods (cookies, crackers), avoid partially hydrogenated oils. Breads and muffins should be made from whole grains (whole-wheat or whole oat flour, instead of "flour" or "enriched flour"). Buy non-creamy canned soups with reduced salt and  no added fats.  FOOD PREPARATION TECHNIQUES  Never deep-fry. If you must fry, either stir-fry, which uses very little fat, or use non-stick cooking sprays. When possible, broil, bake, or roast meats, and steam vegetables. Instead of putting butter or margarine on vegetables, use lemon  and herbs, applesauce, and cinnamon (for squash and sweet potatoes). Use nonfat yogurt, salsa, and low-fat dressings for salads.  LOW-SATURATED FAT / LOW-FAT FOOD SUBSTITUTES Meats / Saturated Fat (g)  Avoid: Steak, marbled (3 oz/85 g) / 11 g  Choose: Steak, lean (3 oz/85 g) / 4 g  Avoid: Hamburger (3 oz/85 g) / 7 g  Choose: Hamburger, lean (3 oz/85 g) / 5 g  Avoid: Ham (3 oz/85 g) / 6 g  Choose: Ham, lean cut (3 oz/85 g) / 2.4 g  Avoid: Chicken, with skin, dark meat (3 oz/85 g) / 4 g  Choose: Chicken, skin removed, dark meat (3 oz/85 g) / 2 g  Avoid: Chicken, with skin, light meat (3 oz/85 g) / 2.5 g  Choose: Chicken, skin removed, light meat (3 oz/85 g) / 1 g Dairy / Saturated Fat (g)  Avoid: Whole milk (1 cup) / 5 g  Choose: Low-fat milk, 2% (1 cup) / 3 g  Choose: Low-fat milk, 1% (1 cup) / 1.5 g  Choose: Skim milk (1 cup) / 0.3 g  Avoid: Hard cheese (1 oz/28 g) / 6 g  Choose: Skim milk cheese (1 oz/28 g) / 2 to 3 g  Avoid: Cottage cheese, 4% fat (1 cup) / 6.5 g  Choose: Low-fat cottage cheese, 1% fat (1 cup) / 1.5 g  Avoid: Ice cream (1 cup) / 9 g  Choose: Sherbet (1 cup) / 2.5 g  Choose: Nonfat frozen yogurt (1 cup) / 0.3 g  Choose: Frozen fruit bar / trace  Avoid: Whipped cream (1 tbs) / 3.5 g  Choose: Nondairy whipped topping (1 tbs) / 1 g Condiments / Saturated Fat (g)  Avoid: Mayonnaise (1 tbs) / 2 g  Choose: Low-fat mayonnaise (1 tbs) / 1 g  Avoid: Butter (1 tbs) / 7 g  Choose: Extra light margarine (1 tbs) / 1 g  Avoid: Coconut oil (1 tbs) / 11.8 g  Choose: Olive oil (1 tbs) / 1.8 g  Choose: Corn oil (1 tbs) / 1.7 g  Choose: Safflower oil (1 tbs) / 1.2 g  Choose: Sunflower oil (1 tbs) / 1.4 g  Choose: Soybean oil (1 tbs) / 2.4 g  Choose: Canola oil (1 tbs) / 1 g Document Released: 02/08/2005 Document Revised: 06/05/2012 Document Reviewed: 05/09/2013 Exit[employer] Patient Information 2015 [place], [place]. This information is not  intended to replace advice given to you by your health care provider. Make sure you discuss any questions you have with your health care provider.

## 2014-05-03 NOTE — Assessment & Plan Note (Signed)
Overtreated. Decrease levo to 125 mcg daily. Recheck levels.

## 2014-05-03 NOTE — Assessment & Plan Note (Signed)
Well controlled on no med. 

## 2014-05-03 NOTE — Progress Notes (Signed)
The patient is here for annual wellness exam and preventative care.  Diabetes: Well controlled on no med.  Lab Results  Component Value Date   HGBA1C 6.5 04/26/2014  Hypoglycemic episodes:?  Hyperglycemic episodes:?  Feet problems:None  Blood Sugars averaging: not checking  eye exam within last year:  In last 2 weeks Diet: moderate  Exercise: walking 2-3 times a week  Elevated Cholesterol: Now not at goal off crestor. Stopped given back pain, hip pain.  Lab Results  Component Value Date   CHOL 215* 04/26/2014   HDL 37.70* 04/26/2014   LDLCALC 143* 04/26/2014   TRIG 171.0* 04/26/2014   CHOLHDL 6 04/26/2014  Muscle aches: None   Hypertension:  Well controlleld on Amlodipine, HCTZ BP Readings from Last 3 Encounters:  05/03/14 126/86  12/25/13 114/70  05/23/13 132/80  Chronic chest pain past eval negative, stable SOB. No swelling in ankles.  Insomnia.. Improved. No depression, anxiety.   ? If chronic chest pain due to GERD, woke at night, pain into neck.  Has daily chest pressure.  Neg stress test etc. Never had endoscopy before.   Hyperthyroidism. pon levothyroxine 137 mcg daily.  TSH low, free t4 slightly elevated, free t3 normal. He feels OV shaky jitttery, heart racing.   Review of Systems  Constitutional: Negative for fever, fatigue and unexpected weight change.  HENT: Negative for ear pain, congestion, snot after meals, present constantly.ore throat, rhinorrhea, trouble swallowing, neck pain and postnasal drip.  Eyes: Negative for pain.  Respiratory: Negative for cough, shortness of breath and wheezing.  Cardiovascular: Positive for chest pain, . Negative for palpitations and leg swelling.  Gastrointestinal: Negative for nausea, abdominal pain, diarrhea, constipation and blood in stool.  Has gas and burping. Genitourinary: Negative for dysuria, urgency, hematuria, discharge, penile swelling, scrotal swelling, difficulty urinating, penile pain and  testicular pain.  Skin: Negative for rash.  Neurological: Negative for syncope, weakness, light-headedness, numbness and headaches.  Psychiatric/Behavioral: Negative for behavioral problems and dysphoric mood. The patient is not nervous/anxious.  Objective:   Physical Exam  Constitutional: He appears well-developed and well-nourished. Non-toxic appearance. He does not appear ill. No distress.  HENT:  Head: Normocephalic and atraumatic.  Right Ear: Hearing, tympanic membrane, external ear and ear canal normal.  Left Ear: Hearing, tympanic membrane, external ear and ear canal normal.  Nose: Nose normal.  Mouth/Throat: Uvula is midline, oropharynx is clear and moist and mucous membranes are normal.  Eyes: Conjunctivae normal, EOM and lids are normal. Pupils are equal, round, and reactive to light. No foreign bodies found.  Neck: Trachea normal, normal range of motion and phonation normal. Neck supple. Carotid bruit is not present. No mass and no thyromegaly present.  Cardiovascular: Normal rate, regular rhythm, S1 normal, S2 normal, intact distal pulses and normal pulses. Exam reveals no gallop.  No murmur heard.  Pulmonary/Chest: Breath sounds normal. He has no wheezes. He has no rhonchi. He has no rales.  Abdominal: Soft. Normal appearance and bowel sounds are normal. There is no hepatosplenomegaly. There is no tenderness. There is no rebound, no guarding and no CVA tenderness. No hernia. Hernia confirmed negative in the right inguinal area and confirmed negative in the left inguinal area.  Genitourinary: Prostate normal, testes normal and penis normal. Rectal exam shows no external hemorrhoid, no internal hemorrhoid, no fissure, no mass, no tenderness and anal tone normal. Guaiac negative stool. Prostate is not enlarged and not tender. Right testis shows no mass and no tenderness. Left testis shows no mass and  no tenderness. No paraphimosis or penile tenderness.  Lymphadenopathy:   He has no cervical adenopathy.  Right: No inguinal adenopathy present.  Left: No inguinal adenopathy present.  Neurological: He is alert. He has normal strength and normal reflexes. No cranial nerve deficit or sensory deficit. Gait normal.  Skin: Skin is warm, dry and intact. No rash noted.  Psychiatric: He has a normal mood and affect. His speech is normal and behavior is normal. Judgment normal.    Assessment & Plan:   The patient's preventative maintenance and recommended screening tests for an annual wellness exam were reviewed in full today.  Brought up to date unless services declined.  Counselled on the importance of diet, exercise, and its role in overall health and mortality.  The patient's FH and SH was reviewed, including their home life, tobacco status, and drug and alcohol status.   Vaccines Td, flu uptodate,  Colon: 2009, adenoma.. Likely repeat 5 years ie 06/2012, Dr. Fuller Plan OVERDUE. Prostate: Stable PSA, rectal exam today.  Lab Results  Component Value Date   PSA 1.57 04/26/2014   PSA 1.88 03/07/2013   PSA 1.80 03/07/2012   nonsmoker

## 2014-05-03 NOTE — Assessment & Plan Note (Addendum)
Neg cards work up despite hx of CAD.  ? Secondary to GERD. Change prevacid to pantoprazole for trial.

## 2014-05-03 NOTE — Assessment & Plan Note (Signed)
Well controlled. Continue current medication.  

## 2014-05-09 ENCOUNTER — Other Ambulatory Visit: Payer: Self-pay | Admitting: Family Medicine

## 2014-05-14 ENCOUNTER — Encounter: Payer: Self-pay | Admitting: Gastroenterology

## 2014-06-17 ENCOUNTER — Other Ambulatory Visit: Payer: Self-pay | Admitting: Family Medicine

## 2014-06-17 NOTE — Telephone Encounter (Signed)
Last office visit 05/03/2014.  Diclofenac not on current medication list.  Ok to refill?

## 2014-06-22 ENCOUNTER — Other Ambulatory Visit: Payer: Self-pay | Admitting: Family Medicine

## 2014-07-01 ENCOUNTER — Ambulatory Visit (AMBULATORY_SURGERY_CENTER): Payer: Self-pay

## 2014-07-01 VITALS — Ht 68.0 in | Wt 213.0 lb

## 2014-07-01 DIAGNOSIS — Z860100 Personal history of colon polyps, unspecified: Secondary | ICD-10-CM

## 2014-07-01 MED ORDER — SUPREP BOWEL PREP KIT 17.5-3.13-1.6 GM/177ML PO SOLN: 1.0000 | Freq: Once | ORAL | Status: DC

## 2014-07-01 NOTE — Progress Notes (Signed)
No allergies to eggs or soy No diet/weight loss meds No home oxygen No past problems with anesthesia PONV after general anesthesia  Has email

## 2014-07-01 NOTE — Addendum Note (Signed)
Addended by: Thurston Pounds F on: 07/01/2014 04:51 PM   Modules accepted: Orders

## 2014-07-15 ENCOUNTER — Ambulatory Visit (AMBULATORY_SURGERY_CENTER): Payer: PRIVATE HEALTH INSURANCE | Admitting: Gastroenterology

## 2014-07-15 ENCOUNTER — Encounter: Payer: Self-pay | Admitting: Gastroenterology

## 2014-07-15 VITALS — BP 111/73 | HR 52 | Temp 97.6°F | Resp 24 | Ht 68.0 in | Wt 213.0 lb

## 2014-07-15 DIAGNOSIS — Z86010 Personal history of colonic polyps: Secondary | ICD-10-CM | POA: Diagnosis not present

## 2014-07-15 MED ORDER — SODIUM CHLORIDE 0.9 % IV SOLN: 500.0000 mL | INTRAVENOUS | Status: DC

## 2014-07-15 NOTE — Op Note (Signed)
[place]  [address]. [place], [address]   COLONOSCOPY PROCEDURE REPORT  PATIENT: Brett, Lowery  MR#: [id number] BIRTHDATE: Apr 11, 1955 , 64  yrs. old GENDER: male ENDOSCOPIST: Ladene Artist, MD, Jolyn Lent PROCEDURE DATE:  07/15/2014 PROCEDURE:   Colonoscopy, surveillance First Screening Colonoscopy - Avg.  risk and is 50 yrs.  old or older - No.  Prior Negative Screening - Now for repeat screening. N/A  History of Adenoma - Now for follow-up colonoscopy & has been > or = to 3 yrs.  Yes hx of adenoma.  Has been 3 or more years since last colonoscopy.  Polyps removed today? No Recommend repeat exam, <10 yrs? Yes high risk ASA CLASS:   Class II INDICATIONS:Surveillance due to prior colonic neoplasia and PH Colon Adenoma. MEDICATIONS: Monitored anesthesia care, Propofol 250 mg IV, and lidocaine 40 mg IV DESCRIPTION OF PROCEDURE:   After the risks benefits and alternatives of the procedure were thoroughly explained, informed consent was obtained.  The digital rectal exam revealed no abnormalities of the rectum.   The [id number]  endoscope was introduced through the anus and advanced to the cecum, which was identified by both the appendix and ileocecal valve. No adverse events experienced.   The quality of the prep was excellent. (Suprep was used)  The instrument was then slowly withdrawn as the colon was fully examined. Estimated blood loss is zero unless otherwise noted in this procedure report.    COLON FINDINGS: There was moderate diverticulosis noted in the sigmoid colon.   The examination was otherwise normal.  Retroflexed views revealed internal Grade I hemorrhoids. The time to cecum = 0.9 Withdrawal time = 8.4   The scope was withdrawn and the procedure completed. COMPLICATIONS: There were no immediate complications.  ENDOSCOPIC IMPRESSION: 1.   Moderate diverticulosis in the sigmoid colon 2.   Grade l internal  hemorrhoids  RECOMMENDATIONS: 1.  High fiber diet with liberal fluid intake. 2.  Repeat Colonoscopy in 5 years.  eSigned:  Ladene Artist, MD, [facility] 07/15/2014 8:22 AM

## 2014-07-15 NOTE — Patient Instructions (Signed)
YOU HAD AN ENDOSCOPIC PROCEDURE TODAY AT [facility]:   Refer to the procedure report that was given to you for any specific questions about what was found during the examination.  If the procedure report does not answer your questions, please call your gastroenterologist to clarify.  If you requested that your care partner not be given the details of your procedure findings, then the procedure report has been included in a sealed envelope for you to review at your convenience later.  YOU SHOULD EXPECT: Some feelings of bloating in the abdomen. Passage of more gas than usual.  Walking can help get rid of the air that was put into your GI tract during the procedure and reduce the bloating. If you had a lower endoscopy (such as a colonoscopy or flexible sigmoidoscopy) you may notice spotting of blood in your stool or on the toilet paper. If you underwent a bowel prep for your procedure, you may not have a normal bowel movement for a few days.  Please Note:  You might notice some irritation and congestion in your nose or some drainage.  This is from the oxygen used during your procedure.  There is no need for concern and it should clear up in a day or so.  SYMPTOMS TO REPORT IMMEDIATELY:   Following lower endoscopy (colonoscopy or flexible sigmoidoscopy):  Excessive amounts of blood in the stool  Significant tenderness or worsening of abdominal pains  Swelling of the abdomen that is new, acute  Fever of 100F or higher  For urgent or emergent issues, a gastroenterologist can be reached at any hour by calling [phone].   DIET: Your first meal following the procedure should be a small meal and then it is ok to progress to your normal diet. Heavy or fried foods are harder to digest and may make you feel nauseous or bloated.  Likewise, meals heavy in dairy and vegetables can increase bloating.  Drink plenty of fluids but you should avoid alcoholic beverages for 24 hours. Try to  increase the fiber in your diet due to the Diverticulosis.  ACTIVITY:  You should plan to take it easy for the rest of today and you should NOT DRIVE or use heavy machinery until tomorrow (because of the sedation medicines used during the test).    FOLLOW UP: Our staff will call the number listed on your records the next business day following your procedure to check on you and address any questions or concerns that you may have regarding the information given to you following your procedure. If we do not reach you, we will leave a message.  However, if you are feeling well and you are not experiencing any problems, there is no need to return our call.  We will assume that you have returned to your regular daily activities without incident.  If any biopsies were taken you will be contacted by phone or by letter within the next 1-3 weeks.  Please call us at [phone] if you have not heard about the biopsies in 3 weeks.    SIGNATURES/CONFIDENTIALITY: You and/or your care partner have signed paperwork which will be entered into your electronic medical record.  These signatures attest to the fact that that the information above on your After Visit Summary has been reviewed and is understood.  Full responsibility of the confidentiality of this discharge information lies with you and/or your care-partner.  Try to read all of the handouts given to you by your recovery  room nurse.

## 2014-07-15 NOTE — Progress Notes (Signed)
Stable to RR 

## 2014-07-16 ENCOUNTER — Telehealth: Payer: Self-pay | Admitting: *Deleted

## 2014-07-16 NOTE — Telephone Encounter (Signed)
  Follow up Call-  Call back number 07/15/2014  Post procedure Call Back phone  # [phone]  Permission to leave phone message Yes     Patient questions:  Do you have a fever, pain , or abdominal swelling? No. Pain Score  0 *  Have you tolerated food without any problems? Yes.    Have you been able to return to your normal activities? Yes.    Do you have any questions about your discharge instructions: Diet   No. Medications  No. Follow up visit  No.  Do you have questions or concerns about your Care? No.  Actions: * If pain score is 4 or above: No action needed, pain <4.

## 2014-08-19 ENCOUNTER — Encounter: Payer: Self-pay | Admitting: Gastroenterology

## 2014-08-27 ENCOUNTER — Other Ambulatory Visit: Payer: Self-pay | Admitting: Family Medicine

## 2014-08-27 NOTE — Telephone Encounter (Signed)
Last office visit 05/03/2014.  Last refilled 06/17/2014 for #[id number] with no refills.  Ok to refill?

## 2014-08-29 ENCOUNTER — Other Ambulatory Visit: Payer: Self-pay | Admitting: Family Medicine

## 2014-09-09 ENCOUNTER — Other Ambulatory Visit: Payer: Self-pay | Admitting: Family Medicine

## 2014-09-16 ENCOUNTER — Ambulatory Visit (INDEPENDENT_AMBULATORY_CARE_PROVIDER_SITE_OTHER)
Admission: RE | Admit: 2014-09-16 | Discharge: 2014-09-16 | Disposition: A | Discharge status: Home / Self Care | Payer: PRIVATE HEALTH INSURANCE | Source: Ambulatory Visit | Attending: Family Medicine | Admitting: Family Medicine

## 2014-09-16 ENCOUNTER — Encounter: Payer: Self-pay | Admitting: Family Medicine

## 2014-09-16 ENCOUNTER — Ambulatory Visit (INDEPENDENT_AMBULATORY_CARE_PROVIDER_SITE_OTHER): Payer: PRIVATE HEALTH INSURANCE | Admitting: Family Medicine

## 2014-09-16 VITALS — BP 120/72 | HR 74 | Temp 98.3°F | Ht 68.0 in | Wt 212.2 lb

## 2014-09-16 DIAGNOSIS — M25551 Pain in right hip: Secondary | ICD-10-CM

## 2014-09-16 DIAGNOSIS — M67951 Unspecified disorder of synovium and tendon, right thigh: Secondary | ICD-10-CM

## 2014-09-16 DIAGNOSIS — M6798 Unspecified disorder of synovium and tendon, other site: Secondary | ICD-10-CM

## 2014-09-16 DIAGNOSIS — M7071 Other bursitis of hip, right hip: Secondary | ICD-10-CM

## 2014-09-16 MED ORDER — METHYLPREDNISOLONE ACETATE 40 MG/ML IJ SUSP
80.0000 mg | Freq: Once | INTRAMUSCULAR | Status: AC
  Administered 2014-09-16: 80 mg via INTRA_ARTICULAR

## 2014-09-16 MED ORDER — TRAMADOL HCL 50 MG PO TABS: 50.0000 mg | ORAL_TABLET | Freq: Four times a day (QID) | ORAL | Status: DC | PRN

## 2014-09-16 NOTE — Progress Notes (Signed)
Dr. Frederico Hamman T. Edana Aguado, MD, [facility] [place], [address] Phone: [phone] Fax: [phone]  09/16/2014  Patient: Brett Lowery, MRN: [id number], DOB: 02/10/1956, 59 y.o.  Primary Physician:  Eliezer Lofts, MD  Chief Complaint: Hip Pain  Subjective:   Brett Lowery is a 59 y.o. very pleasant male patient who presents with the following:  R hip on the side. Feels like some pressure. Not a shooting pain. Dull pain and will get worse and worse. Has not been this severe - ongoing for a year. Posterior pelvic rim. Minimal to no back pain.  In the last week, really bad. Sitting is the worst thing. Laying down is ok.  At home, has taken Alleve, Advil, Motrin. Nothing helps.  Has done some topical ointments. Does not help much.   R posterior pelvic rim injection  Past Medical History, Surgical History, Social History, Family History, Problem List, Medications, and Allergies have been reviewed and updated if relevant.  Patient Active Problem List   Diagnosis Date Noted  . Low back pain 05/23/2013  . Chest pain 02/27/2013  . DOE (dyspnea on exertion) 02/27/2013  . DDD (degenerative disc disease), cervical 01/01/2013  . Chronic neck pain 09/28/2012  . Tinnitus of both ears 09/28/2012  . Insomnia 09/17/2011  . Pulmonary nodule seen on imaging study 09/17/2011  . BENIGN PROSTATIC HYPERTROPHY, HX OF 12/17/2009  . CHRONIC RHINITIS 12/18/2008  . DYSLIPIDEMIA 07/10/2008  . Diabetes mellitus with no complication 65/04/5463  . CAD 04/25/2007  . Hypothyroidism 06/15/2006  . HYPERCHOLESTEROLEMIA 06/15/2006  . Essential hypertension, benign 06/15/2006  . GERD 06/15/2006    Past Medical History  Diagnosis Date  . HTN (hypertension)   . CAD (coronary artery disease)   . Dyslipidemia   . Chest pain   . DM (diabetes mellitus)     w/o complications  . Well adult exam   . Special screening for malignant neoplasm of prostate     . Special screening for malignant neoplasms, colon   . Insomnia   . Disorders of bursae and tendons in shoulder region, unspecified     R, tendonitis  . GERD (gastroesophageal reflux disease)   . Hypothyroidism   . Hypercholesterolemia     Past Surgical History  Procedure Laterality Date  . Thyroidectomy  2004    thyroid nodile  . Cornia transplants  2003  . Hernia repair  2002  . Dg esophagus -ba sw  2005    mod reflux and hiatal hernia  . Cardiac catheterization  2009    shoed no obstructive CAD, medical management     History   Social History  . Marital Status: Married    Spouse Name: N/A  . Number of Children: N/A  . Years of Education: N/A   Occupational History  . Not on file.   Social History Main Topics  . Smoking status: Former Smoker    Quit date: 03/28/2001  . Smokeless tobacco: Never Used     Comment: 40 pack year hx   . Alcohol Use: No  . Drug Use: No  . Sexual Activity: Not on file   Other Topics Concern  . 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   Diet: fruit and veggies. Occ. Fast food on weekends.       designated party release form signed authorizing  Percell Miller Rabinovich (wife) [phone]. Allen Norris 12/12/09.     Family History  Problem Relation Age of Onset  . Heart attack Neg Hx     < age 65  . Colon cancer Neg Hx   . Esophageal cancer Neg Hx   . Rectal cancer Neg Hx   . Stomach cancer Neg Hx     No Known Allergies  Medication list reviewed and updated in full in [facility].  GEN: No fevers, chills. Nontoxic. Primarily MSK c/o today. MSK: Detailed in the HPI GI: tolerating PO intake without difficulty Neuro: No numbness, parasthesias, or tingling associated. Otherwise the pertinent positives of the ROS are noted above.   Objective:   BP 120/72 mmHg  Pulse 74  Temp(Src) 98.3 F (36.8 C) (Oral)  Ht 5\' 8"   (1.727 m)  Wt 212 lb 4 oz (96.276 kg)  BMI 32.28 kg/m2   GEN: Well-developed,well-nourished,in no acute distress; alert,appropriate and cooperative throughout examination HEENT: Normocephalic and atraumatic without obvious abnormalities. Ears, externally no deformities PULM: Breathing comfortably in no respiratory distress EXT: No clubbing, cyanosis, or edema PSYCH: Normally interactive. Cooperative during the interview. Pleasant. Friendly and conversant. Not anxious or depressed appearing. Normal, full affect.  Range of motion at  the waist: Flexion: normal Extension: normal Lateral bending: normal Rotation: all normal  No echymosis or edema Rises to examination table with no difficulty Gait: non antalgic  Inspection/Deformity: N Paraspinus Tenderness: none  B Ankle Dorsiflexion (L5,4): 5/5 B Great Toe Dorsiflexion (L5,4): 5/5 Heel Walk (L5): WNL Toe Walk (S1): WNL Rise/Squat (L4): WNL  SENSORY B Medial Foot (L4): WNL B Dorsum (L5): WNL B Lateral (S1): WNL Light Touch: WNL Pinprick: WNL  REFLEXES Knee (L4): 2+ Ankle (S1): 2+  B SLR, seated: neg B SLR, supine: neg B Greater Troch: NT B Log Roll: neg B Stork: NT B Sciatic Notch: NT   HIP EXAM: SIDE: R ROM: Abduction, Flexion, Internal and External range of motion: approaching full Pain with terminal IROM and EROM: minimal GTB: NT Right posterior pelvic brim approaching insertion point on the right, notable tenderness to palpation several areas. SLR: NEG Knees: No effusion FABER: NT REVERSE FABER: NT, neg Piriformis: mild ttp at direct palpation Str: flexion: 5/5 abduction: 4+/5 on R adduction: 5/5 Strength testing non-tender  Radiology: Dg Lumbar Spine Complete  09/16/2014   CLINICAL DATA:  Right hip pain.  EXAM: LUMBAR SPINE - COMPLETE 4+ VIEW  COMPARISON:  CT scan of the abdomen dated 09/17/2011  FINDINGS: There is a lumbar scoliosis to the left centered at L2-3. There is no disc space narrowing or  spondylolisthesis. No bone destruction or fractures.  IMPRESSION: Lumbar scoliosis. No other significant findings. No significant change since the prior study.   Electronically Signed   By: Lorriane Shire M.D.   On: 09/16/2014 12:26   Dg Pelvis 1-2 Views  09/16/2014   CLINICAL DATA:  Right hip pain without history of trauma  EXAM: PELVIS - 1-2 VIEW  COMPARISON:  Lumbar spine series of today's date  FINDINGS: The bony pelvis is adequately mineralized. There is no lytic or blastic lesion. The sacrum and SI joints are grossly normal.  There is moderate symmetrical bilateral hip joint space narrowing slightly greater on the left than on the right. The femoral heads and acetabuli remains smoothly rounded. The femoral neck and intertrochanteric regions are normal.  IMPRESSION: There is moderate hip joint space narrowing bilaterally consistent with osteoarthritis. There is no acute bony abnormality of the  pelvis.   Electronically Signed   By: David  Martinique M.D.   On: 09/16/2014 12:20     Assessment and Plan:   Right hip pain - Plan: DG Lumbar Spine Complete, DG Pelvis 1-2 Views, methylPREDNISolone acetate (DEPO-MEDROL) injection 80 mg  Tendinopathy of right gluteus medius  Hip bursitis, right  Posterior hip pain, but the patient has good, excellent range of motion with mild to moderate osteoarthritic changes on radiographs.  Posterior pain could also be referred from the spine.  Diagnostic and therapeutic injection, posterior pelvic brim for gluteus medias tendinopathy, potential bursitis as well.  R Posterior Pelvic Rim Injection Verbal consent obtained. Risks (including infection, potential atrophy), benefits, and alternatives reviewed. Skin sterilely prepped with Chloraprep. Ethyl Chloride used for anesthesia. 8 cc of Lidocaine 1% injected with Depo-Medrol 80 mg used for injection. 3 places were marked and prepped along the right posterior rim.  Injection taken to bone. No bleeding and no  significant complications. Decreased pain after injection. The patient did experience some numbness in his right-sided buttocks region postinjection.  Strength remained preserved.  Likely this was an effect of the 8 cc of lidocaine anesthetizing a topical nerve.  He ambulated appropriately to his car, and I assisted him there. Needle: 22 gauge spinal needle   New Prescriptions   TRAMADOL (ULTRAM) 50 MG TABLET    Take 1 tablet (50 mg total) by mouth every 6 (six) hours as needed.   Orders Placed This Encounter  Procedures  . DG Lumbar Spine Complete  . DG Pelvis 1-2 Views    Signed,  Frederico Hamman T. Lebert Lovern, MD   Patient's Medications  New Prescriptions   TRAMADOL (ULTRAM) 50 MG TABLET    Take 1 tablet (50 mg total) by mouth every 6 (six) hours as needed.  Previous Medications   AMLODIPINE (NORVASC) 5 MG TABLET    TAKE 1 TABLET BY MOUTH DAILY   ASPIRIN 81 MG TABLET    Take 81 mg by mouth daily.     DICLOFENAC (VOLTAREN) 75 MG EC TABLET    TAKE 1 TABLET BY MOUTH TWICE DAILY   FLUTICASONE (FLONASE) 50 MCG/ACT NASAL SPRAY    PLACE 2 SPRAYS INTO BOTH NOSTRIL EVERY DAY   HYDROCHLOROTHIAZIDE (HYDRODIURIL) 25 MG TABLET    TAKE 1 TABLET BY MOUTH DAILY   LEVOTHYROXINE (SYNTHROID) 125 MCG TABLET    Take 1 tablet (125 mcg total) by mouth daily before breakfast.   PANTOPRAZOLE (PROTONIX) 40 MG TABLET    TAKE 1 TABLET(40 MG) BY MOUTH DAILY  Modified Medications   No medications on file  Discontinued Medications   CYCLOBENZAPRINE (FLEXERIL) 10 MG TABLET    Take 1 tablet (10 mg total) by mouth 3 (three) times daily as needed for muscle spasms (caution-will sedate).

## 2014-09-16 NOTE — Progress Notes (Signed)
Pre visit review using our clinic review tool, if applicable. No additional management support is needed unless otherwise documented below in the visit note. 

## 2014-09-23 ENCOUNTER — Ambulatory Visit (INDEPENDENT_AMBULATORY_CARE_PROVIDER_SITE_OTHER): Payer: PRIVATE HEALTH INSURANCE | Admitting: Family Medicine

## 2014-09-23 ENCOUNTER — Encounter: Payer: Self-pay | Admitting: Family Medicine

## 2014-09-23 VITALS — BP 120/70 | HR 73 | Temp 98.3°F | Ht 68.0 in | Wt 207.2 lb

## 2014-09-23 DIAGNOSIS — M545 Low back pain, unspecified: Secondary | ICD-10-CM

## 2014-09-23 MED ORDER — PREDNISONE 20 MG PO TABS
ORAL_TABLET | ORAL | Status: DC
Start: 2014-09-23 — End: 2015-05-23

## 2014-09-23 MED ORDER — CYCLOBENZAPRINE HCL 10 MG PO TABS: 5.0000 mg | ORAL_TABLET | Freq: Every day | ORAL | Status: DC

## 2014-09-23 NOTE — Progress Notes (Signed)
Pre visit review using our clinic review tool, if applicable. No additional management support is needed unless otherwise documented below in the visit note. 

## 2014-09-23 NOTE — Progress Notes (Signed)
Dr. Frederico Hamman T. Crystel Demarco, MD, [facility] [place], [address] Phone: [phone] Fax: [phone]  09/23/2014  Patient: Brett Lowery, MRN: [id number], DOB: [birth year], 59 y.o.  Primary Physician:  Eliezer Lofts, MD  Chief Complaint: Follow-up  Subjective:   Brett Lowery is a 59 y.o. very pleasant male patient who presents with the following:  F/u recent OV: Today still having a lot of pain, gotten a little better.  Last saw the patient indicated diagnostic and therapeutic injection along the patient's posterior pelvic rim, and I thought that he was having some gluteus medius tendinopathy and bursitis. He is back today, and he has not really improved.  He does have some intermittent ongoing back pain, but on my review his lumbar spine films were not incredibly remarkable other than some degenerative changes that would be relatively normal at 59 year old.  09/16/2014 Last OV with Owens Loffler, MD  R hip on the side. Feels like some pressure. Not a shooting pain. Dull pain and will get worse and worse. Has not been this severe - ongoing for a year. Posterior pelvic rim. Minimal to no back pain.  In the last week, really bad. Sitting is the worst thing. Laying down is ok.  At home, has taken Alleve, Advil, Motrin. Nothing helps.  Has done some topical ointments. Does not help much.   R posterior pelvic rim injection  Past Medical History, Surgical History, Social History, Family History, Problem List, Medications, and Allergies have been reviewed and updated if relevant.  Patient Active Problem List   Diagnosis Date Noted  . Low back pain 05/23/2013  . Chest pain 02/27/2013  . DOE (dyspnea on exertion) 02/27/2013  . DDD (degenerative disc disease), cervical 01/01/2013  . Chronic neck pain 09/28/2012  . Tinnitus of both ears 09/28/2012  . Insomnia 09/17/2011  . Pulmonary nodule seen on imaging study 09/17/2011  . BENIGN  PROSTATIC HYPERTROPHY, HX OF 12/17/2009  . CHRONIC RHINITIS 12/18/2008  . DYSLIPIDEMIA 07/10/2008  . Diabetes mellitus with no complication 19/62/2297  . CAD 04/25/2007  . Hypothyroidism 06/15/2006  . HYPERCHOLESTEROLEMIA 06/15/2006  . Essential hypertension, benign 06/15/2006  . GERD 06/15/2006    Past Medical History  Diagnosis Date  . HTN (hypertension)   . CAD (coronary artery disease)   . Dyslipidemia   . Chest pain   . DM (diabetes mellitus)     w/o complications  . Well adult exam   . Special screening for malignant neoplasm of prostate   . Special screening for malignant neoplasms, colon   . Insomnia   . Disorders of bursae and tendons in shoulder region, unspecified     R, tendonitis  . GERD (gastroesophageal reflux disease)   . Hypothyroidism   . Hypercholesterolemia     Past Surgical History  Procedure Laterality Date  . Thyroidectomy  2004    thyroid nodile  . Cornia transplants  2003  . Hernia repair  2002  . Dg esophagus -ba sw  2005    mod reflux and hiatal hernia  . Cardiac catheterization  2009    shoed no obstructive CAD, medical management     History   Social History  . Marital Status: Married    Spouse Name: N/A  . Number of Children: N/A  . Years of Education: N/A   Occupational History  . Not on file.   Social History Main Topics  . Smoking status: Former Audiological scientist  date: 03/28/2001  . Smokeless tobacco: Never Used     Comment: 40 pack year hx   . Alcohol Use: No  . Drug Use: No  . Sexual Activity: Not on file   Other Topics Concern  . 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   Diet: fruit and veggies. Occ. Fast food on weekends.       designated party release form signed authorizing Mardene Celeste C. Weatherholtz (wife) [phone]. Allen Norris 12/12/09.     Family History  Problem Relation Age of Onset  . Heart attack Neg  Hx     < age 27  . Colon cancer Neg Hx   . Esophageal cancer Neg Hx   . Rectal cancer Neg Hx   . Stomach cancer Neg Hx     No Known Allergies  Medication list reviewed and updated in full in [facility].  GEN: No fevers, chills. Nontoxic. Primarily MSK c/o today. MSK: Detailed in the HPI GI: tolerating PO intake without difficulty Neuro: No numbness, parasthesias, or tingling associated. Otherwise the pertinent positives of the ROS are noted above.   Objective:   BP 120/70 mmHg  Pulse 73  Temp(Src) 98.3 F (36.8 C) (Oral)  Ht 5\' 8"  (1.727 m)  Wt 207 lb 4 oz (94.008 kg)  BMI 31.52 kg/m2   GEN: Well-developed,well-nourished,in no acute distress; alert,appropriate and cooperative throughout examination HEENT: Normocephalic and atraumatic without obvious abnormalities. Ears, externally no deformities PULM: Breathing comfortably in no respiratory distress EXT: No clubbing, cyanosis, or edema PSYCH: Normally interactive. Cooperative during the interview. Pleasant. Friendly and conversant. Not anxious or depressed appearing. Normal, full affect.  Range of motion at  the waist: Flexion: normal Extension: normal Lateral bending: normal Rotation: all normal  No echymosis or edema Rises to examination table with no difficulty Gait: non antalgic  Inspection/Deformity: N Paraspinus Tenderness: none  B Ankle Dorsiflexion (L5,4): 5/5 B Great Toe Dorsiflexion (L5,4): 5/5 Heel Walk (L5): WNL Toe Walk (S1): WNL Rise/Squat (L4): WNL  SENSORY B Medial Foot (L4): WNL B Dorsum (L5): WNL B Lateral (S1): WNL Light Touch: WNL Pinprick: WNL  REFLEXES Knee (L4): 2+ Ankle (S1): 2+  B SLR, seated: neg B SLR, supine: neg B Greater Troch: NT B Log Roll: neg B Stork: NT B Sciatic Notch: NT   HIP EXAM: SIDE: R ROM: Abduction, Flexion, Internal and External range of motion: approaching full Pain with terminal IROM and EROM: minimal GTB: NT. SLR: NEG Knees: No  effusion FABER: NT REVERSE FABER: NT, neg Piriformis: mild ttp at direct palpation Str: flexion: 5/5 abduction: 4+/5 on R adduction: 5/5 Strength testing non-tender  Radiology: Dg Lumbar Spine Complete  09/16/2014   CLINICAL DATA:  Right hip pain.  EXAM: LUMBAR SPINE - COMPLETE 4+ VIEW  COMPARISON:  CT scan of the abdomen dated 09/17/2011  FINDINGS: There is a lumbar scoliosis to the left centered at L2-3. There is no disc space narrowing or spondylolisthesis. No bone destruction or fractures.  IMPRESSION: Lumbar scoliosis. No other significant findings. No significant change since the prior study.   Electronically Signed   By: Lorriane Shire M.D.   On: 09/16/2014 12:26   Dg Pelvis 1-2 Views  09/16/2014   CLINICAL DATA:  Right hip pain without history of trauma  EXAM: PELVIS - 1-2 VIEW  COMPARISON:  Lumbar spine series of today's date  FINDINGS:  The bony pelvis is adequately mineralized. There is no lytic or blastic lesion. The sacrum and SI joints are grossly normal.  There is moderate symmetrical bilateral hip joint space narrowing slightly greater on the left than on the right. The femoral heads and acetabuli remains smoothly rounded. The femoral neck and intertrochanteric regions are normal.  IMPRESSION: There is moderate hip joint space narrowing bilaterally consistent with osteoarthritis. There is no acute bony abnormality of the pelvis.   Electronically Signed   By: David  Martinique M.D.   On: 09/16/2014 12:20     Assessment and Plan:   Right-sided low back pain without sciatica   Ongoing right-sided posterior buttocks pain. At this point, think that I was initially incorrect, and this is likely referred pain from the patient's back.  A rehabilitation program from the [employer] [facility] was reviewed with the patient face to face for their condition.   We will try pulse and taper dose steroids, Flexeril at nighttime. Voltaren when steroids complete   New  Prescriptions   CYCLOBENZAPRINE (FLEXERIL) 10 MG TABLET    Take 0.5-1 tablets (5-10 mg total) by mouth at bedtime.   PREDNISONE (DELTASONE) 20 MG TABLET    2 tabs po for 5 days, then 1 tab po for 5 days   No orders of the defined types were placed in this encounter.    Signed,  Maud Deed. Tawnie Ehresman, MD   Patient's Medications  New Prescriptions   CYCLOBENZAPRINE (FLEXERIL) 10 MG TABLET    Take 0.5-1 tablets (5-10 mg total) by mouth at bedtime.   PREDNISONE (DELTASONE) 20 MG TABLET    2 tabs po for 5 days, then 1 tab po for 5 days  Previous Medications   AMLODIPINE (NORVASC) 5 MG TABLET    TAKE 1 TABLET BY MOUTH DAILY   ASPIRIN 81 MG TABLET    Take 81 mg by mouth daily.     DICLOFENAC (VOLTAREN) 75 MG EC TABLET    TAKE 1 TABLET BY MOUTH TWICE DAILY   FLUTICASONE (FLONASE) 50 MCG/ACT NASAL SPRAY    PLACE 2 SPRAYS INTO BOTH NOSTRIL EVERY DAY   HYDROCHLOROTHIAZIDE (HYDRODIURIL) 25 MG TABLET    TAKE 1 TABLET BY MOUTH DAILY   LEVOTHYROXINE (SYNTHROID) 125 MCG TABLET    Take 1 tablet (125 mcg total) by mouth daily before breakfast.   PANTOPRAZOLE (PROTONIX) 40 MG TABLET    TAKE 1 TABLET(40 MG) BY MOUTH DAILY   TRAMADOL (ULTRAM) 50 MG TABLET    Take 1 tablet (50 mg total) by mouth every 6 (six) hours as needed.  Modified Medications   No medications on file  Discontinued Medications   No medications on file

## 2014-10-02 ENCOUNTER — Telehealth: Payer: Self-pay

## 2014-10-02 DIAGNOSIS — M545 Low back pain, unspecified: Secondary | ICD-10-CM

## 2014-10-02 NOTE — Telephone Encounter (Signed)
Done - 10 months of symptoms.

## 2014-10-02 NOTE — Telephone Encounter (Signed)
Pt left v/m requesting a referral for hip pain to [facility]. Pt was seen 09/23/14 and hip pain is no better and pt wants referral ASAP.Please advise.

## 2014-10-14 ENCOUNTER — Other Ambulatory Visit: Payer: Self-pay | Admitting: Orthopedic Surgery

## 2014-10-14 DIAGNOSIS — M545 Low back pain: Secondary | ICD-10-CM

## 2014-10-18 ENCOUNTER — Other Ambulatory Visit: Payer: Self-pay | Admitting: Family Medicine

## 2014-10-20 ENCOUNTER — Ambulatory Visit
Admission: RE | Admit: 2014-10-20 | Discharge: 2014-10-20 | Disposition: A | Discharge status: Home / Self Care | Payer: PRIVATE HEALTH INSURANCE | Source: Ambulatory Visit | Attending: Orthopedic Surgery | Admitting: Orthopedic Surgery

## 2014-10-20 DIAGNOSIS — M545 Low back pain: Secondary | ICD-10-CM

## 2014-11-23 ENCOUNTER — Other Ambulatory Visit: Payer: Self-pay | Admitting: Family Medicine

## 2014-11-24 NOTE — Telephone Encounter (Signed)
Last office visit 09/23/14 with Dr. Lorelei Pont.  Last refilled 10/18/14 for #60 with no refills.  Ok to refill?

## 2014-12-20 ENCOUNTER — Other Ambulatory Visit: Payer: Self-pay | Admitting: Family Medicine

## 2015-01-06 ENCOUNTER — Other Ambulatory Visit: Payer: Self-pay | Admitting: Family Medicine

## 2015-01-06 NOTE — Telephone Encounter (Signed)
Last office visit 09/23/2014 with Dr. Lorelei Pont.  Last refilled 11/24/2014 for #60 with no refills.  Ok to refill?

## 2015-02-10 ENCOUNTER — Other Ambulatory Visit: Payer: Self-pay | Admitting: Family Medicine

## 2015-03-19 ENCOUNTER — Other Ambulatory Visit: Payer: Self-pay | Admitting: Family Medicine

## 2015-03-19 NOTE — Telephone Encounter (Signed)
Last office visit 09/23/2014 with Dr. Lorelei Pont.  Last refilled 02/10/2015 for #60 with no refills.  Ok to refill?

## 2015-04-21 ENCOUNTER — Other Ambulatory Visit: Payer: Self-pay | Admitting: Family Medicine

## 2015-04-21 NOTE — Telephone Encounter (Signed)
Last office visit 09/23/2014 with Dr. Lorelei Pont.  Last refilled 03/20/2015 for #60 with no refills.  Ok to refill?

## 2015-04-24 ENCOUNTER — Other Ambulatory Visit: Payer: Self-pay | Admitting: Family Medicine

## 2015-05-16 ENCOUNTER — Telehealth: Payer: Self-pay | Admitting: Family Medicine

## 2015-05-16 DIAGNOSIS — E119 Type 2 diabetes mellitus without complications: Secondary | ICD-10-CM

## 2015-05-16 DIAGNOSIS — Z1159 Encounter for screening for other viral diseases: Secondary | ICD-10-CM

## 2015-05-16 DIAGNOSIS — Z1211 Encounter for screening for malignant neoplasm of colon: Secondary | ICD-10-CM

## 2015-05-16 DIAGNOSIS — Z125 Encounter for screening for malignant neoplasm of prostate: Secondary | ICD-10-CM

## 2015-05-16 DIAGNOSIS — E890 Postprocedural hypothyroidism: Secondary | ICD-10-CM

## 2015-05-16 DIAGNOSIS — E785 Hyperlipidemia, unspecified: Secondary | ICD-10-CM

## 2015-05-16 NOTE — Telephone Encounter (Signed)
-----   Message from Marchia Bond sent at 05/09/2015  9:35 AM EDT ----- Regarding: Cpx labs Mon 3/27, need orders. Thanks! :-) Please order  future cpx labs for pt's upcoming lab appt. Thanks Aniceto Boss

## 2015-05-19 ENCOUNTER — Other Ambulatory Visit: Payer: Self-pay | Admitting: Family Medicine

## 2015-05-19 ENCOUNTER — Other Ambulatory Visit (INDEPENDENT_AMBULATORY_CARE_PROVIDER_SITE_OTHER): Payer: PRIVATE HEALTH INSURANCE

## 2015-05-19 DIAGNOSIS — E119 Type 2 diabetes mellitus without complications: Secondary | ICD-10-CM

## 2015-05-19 DIAGNOSIS — Z125 Encounter for screening for malignant neoplasm of prostate: Secondary | ICD-10-CM

## 2015-05-19 DIAGNOSIS — Z1159 Encounter for screening for other viral diseases: Secondary | ICD-10-CM

## 2015-05-19 DIAGNOSIS — E785 Hyperlipidemia, unspecified: Secondary | ICD-10-CM

## 2015-05-19 LAB — COMPREHENSIVE METABOLIC PANEL
ALT: 37 U/L (ref 0–53)
AST: 22 U/L (ref 0–37)
Albumin: 4.5 g/dL (ref 3.5–5.2)
Alkaline Phosphatase: 64 U/L (ref 39–117)
BUN: 24 mg/dL — ABNORMAL HIGH (ref 6–23)
CO2: 34 mEq/L — ABNORMAL HIGH (ref 19–32)
Calcium: 9.9 mg/dL (ref 8.4–10.5)
Chloride: 101 mEq/L (ref 96–112)
Creatinine, Ser: 1.22 mg/dL (ref 0.40–1.50)
GFR: 64.77 mL/min (ref >60.00)
Glucose, Bld: 109 mg/dL — ABNORMAL HIGH (ref 70–99)
Potassium: 4.2 mEq/L (ref 3.5–5.1)
Sodium: 141 mEq/L (ref 135–145)
Total Bilirubin: 0.7 mg/dL (ref 0.2–1.2)
Total Protein: 6.7 g/dL (ref 6.0–8.3)

## 2015-05-19 LAB — HEPATITIS C ANTIBODY: HCV Ab: NEGATIVE

## 2015-05-19 LAB — MICROALBUMIN / CREATININE URINE RATIO
Creatinine,U: 179.3 mg/dL
MICROALB UR: 1.1 mg/dL (ref 0.0–1.9)
Microalb Creat Ratio: 0.6 mg/g (ref 0.0–30.0)

## 2015-05-19 LAB — PSA: PSA: 1.91 ng/mL (ref 0.10–4.00)

## 2015-05-19 LAB — LIPID PANEL
CHOL/HDL RATIO: 6
Cholesterol: 213 mg/dL — ABNORMAL HIGH (ref 0–200)
HDL: 38.2 mg/dL — ABNORMAL LOW (ref >39.00)
LDL CALC: 146 mg/dL — AB (ref 0–99)
NONHDL: 174.47
Triglycerides: 142 mg/dL (ref 0.0–149.0)
VLDL: 28.4 mg/dL (ref 0.0–40.0)

## 2015-05-19 LAB — HEMOGLOBIN A1C: Hgb A1c MFr Bld: 6.4 % (ref 4.6–6.5)

## 2015-05-20 ENCOUNTER — Other Ambulatory Visit: Payer: Self-pay | Admitting: Family Medicine

## 2015-05-20 NOTE — Telephone Encounter (Signed)
Last office visit 09/23/2014 with Dr. Lorelei Pont.  Last refilled 04/21/2015 for #60 with no refills.  Ok to refill?

## 2015-05-23 ENCOUNTER — Encounter: Payer: Self-pay | Admitting: Family Medicine

## 2015-05-23 ENCOUNTER — Ambulatory Visit (INDEPENDENT_AMBULATORY_CARE_PROVIDER_SITE_OTHER): Payer: PRIVATE HEALTH INSURANCE | Admitting: Family Medicine

## 2015-05-23 VITALS — BP 110/70 | HR 65 | Temp 98.3°F | Ht 67.0 in | Wt 210.8 lb

## 2015-05-23 DIAGNOSIS — Z0000 Encounter for general adult medical examination without abnormal findings: Secondary | ICD-10-CM

## 2015-05-23 DIAGNOSIS — E890 Postprocedural hypothyroidism: Secondary | ICD-10-CM | POA: Diagnosis not present

## 2015-05-23 DIAGNOSIS — I10 Essential (primary) hypertension: Secondary | ICD-10-CM | POA: Diagnosis not present

## 2015-05-23 DIAGNOSIS — E119 Type 2 diabetes mellitus without complications: Secondary | ICD-10-CM

## 2015-05-23 DIAGNOSIS — E7800 Pure hypercholesterolemia, unspecified: Secondary | ICD-10-CM

## 2015-05-23 LAB — HM DIABETES FOOT EXAM

## 2015-05-23 MED ORDER — PANTOPRAZOLE SODIUM 40 MG PO TBEC: DELAYED_RELEASE_TABLET | ORAL | Status: DC

## 2015-05-23 MED ORDER — LEVOTHYROXINE SODIUM 125 MCG PO TABS: ORAL_TABLET | ORAL | Status: DC

## 2015-05-23 MED ORDER — AMLODIPINE BESYLATE 5 MG PO TABS: 5.0000 mg | ORAL_TABLET | Freq: Every day | ORAL | Status: DC

## 2015-05-23 MED ORDER — FLUTICASONE PROPIONATE 50 MCG/ACT NA SUSP: NASAL | Status: DC

## 2015-05-23 MED ORDER — DICLOFENAC SODIUM 75 MG PO TBEC: 75.0000 mg | DELAYED_RELEASE_TABLET | Freq: Two times a day (BID) | ORAL | Status: DC

## 2015-05-23 MED ORDER — HYDROCHLOROTHIAZIDE 25 MG PO TABS: 25.0000 mg | ORAL_TABLET | Freq: Every day | ORAL | Status: DC

## 2015-05-23 NOTE — Patient Instructions (Addendum)
Work on weight loss , keep up the regular exercise and work on low carb diet. Decrease animal fats, low chol diet.

## 2015-05-23 NOTE — Progress Notes (Signed)
Pre visit review using our clinic review tool, if applicable. No additional management support is needed unless otherwise documented below in the visit note. 

## 2015-05-23 NOTE — Assessment & Plan Note (Signed)
Inadequate control. Work on lifestyle change.. If not at goal at next OV..start no statin med or very low dose statin.

## 2015-05-23 NOTE — Assessment & Plan Note (Signed)
Encouraged exercise, weight loss, healthy eating habits. ? ?

## 2015-05-23 NOTE — Assessment & Plan Note (Signed)
Improved, borderline. Encouraged exercise, weight loss, healthy eating habits. Microalbumin neg.

## 2015-05-23 NOTE — Progress Notes (Signed)
The patient is here for annual wellness exam and preventative care.  Diabetes: Well controlled on no med.  Lab Results  Component Value Date   HGBA1C 6.4 05/19/2015  Hypoglycemic episodes:?  Hyperglycemic episodes:?  Feet problems:None Blood Sugars averaging: not checking  eye exam within last year: coming up  Diet: moderate, decreaing sugar Exercise: walking 2-3 times a week  Body mass index is 33 kg/(m^2).  Wt Readings from Last 3 Encounters:  05/23/15 210 lb 12 oz (95.596 kg)  09/23/14 207 lb 4 oz (94.008 kg)  09/16/14 212 lb 4 oz (96.276 kg)    Elevated Cholesterol: Now not at goal on no med... off crestor. Stopped given back pain, hip pain.  Lab Results  Component Value Date   CHOL 213* 05/19/2015   HDL 38.20* 05/19/2015   LDLCALC 146* 05/19/2015   TRIG 142.0 05/19/2015   CHOLHDL 6 05/19/2015  Muscle aches: None   Hypertension: Well controlleld on Amlodipine, HCTZ BP Readings from Last 3 Encounters:  05/23/15 110/70  09/23/14 120/70  09/16/14 120/72  Chronic chest pain past eval negative, stable SOB. No swelling in ankles.  Insomnia.. Moderate control, issues off and on.  SE to medication. No depression, anxiety.   ? If chronic chest pain due to GERD, woke at night, pain into neck. Has daily chest pressure. Neg stress test etc. Never had endoscopy before.   Hyperthyroidism. Pt on levothyroxine 137 mcg daily. Lab Results  Component Value Date   TSH 0.25* 04/26/2014   Due for re-eval. Will do at next OV.     Review of Systems  Constitutional: Negative for fever, fatigue and unexpected weight change.  HENT: Negative for ear pain, congestion, snot after meals, present constantly.ore throat, rhinorrhea, trouble swallowing, neck pain and postnasal drip.  Eyes: Negative for pain.  Respiratory: Negative for cough, shortness of breath and wheezing.  Cardiovascular: Positive for chest pain, . Negative for palpitations and leg swelling.   Gastrointestinal: Negative for nausea, abdominal pain, diarrhea, constipation and blood in stool.  Has gas and burping. Genitourinary: Negative for dysuria, urgency, hematuria, discharge, penile swelling, scrotal swelling, difficulty urinating, penile pain and testicular pain.  Skin: Negative for rash.  Neurological: Negative for syncope, weakness, light-headedness, numbness and headaches.  Psychiatric/Behavioral: Negative for behavioral problems and dysphoric mood. The patient is not nervous/anxious.  Objective:   Physical Exam  Constitutional: He appears well-developed and well-nourished. Non-toxic appearance. He does not appear ill. No distress.  HENT:  Head: Normocephalic and atraumatic.  Right Ear: Hearing, tympanic membrane, external ear and ear canal normal.  Left Ear: Hearing, tympanic membrane, external ear and ear canal normal.  Nose: Nose normal.  Mouth/Throat: Uvula is midline, oropharynx is clear and moist and mucous membranes are normal.  Eyes: Conjunctivae normal, EOM and lids are normal. Pupils are equal, round, and reactive to light. No foreign bodies found.  Neck: Trachea normal, normal range of motion and phonation normal. Neck supple. Carotid bruit is not present. No mass and no thyromegaly present.  Cardiovascular: Normal rate, regular rhythm, S1 normal, S2 normal, intact distal pulses and normal pulses. Exam reveals no gallop.  No murmur heard.  Pulmonary/Chest: Breath sounds normal. He has no wheezes. He has no rhonchi. He has no rales.  Abdominal: Soft. Normal appearance and bowel sounds are normal. There is no hepatosplenomegaly. There is no tenderness. There is no rebound, no guarding and no CVA tenderness. No hernia. Hernia confirmed negative in the right inguinal area and confirmed negative in the left  inguinal area.  Genitourinary: Prostate normal, testes normal and penis normal. Rectal exam shows no external hemorrhoid, no internal hemorrhoid, no  fissure, no mass, no tenderness and anal tone normal. Guaiac negative stool. Prostate is not enlarged and not tender. Right testis shows no mass and no tenderness. Left testis shows no mass and no tenderness. No paraphimosis or penile tenderness.  Lymphadenopathy:  He has no cervical adenopathy.  Right: No inguinal adenopathy present.  Left: No inguinal adenopathy present.  Neurological: He is alert. He has normal strength and normal reflexes. No cranial nerve deficit or sensory deficit. Gait normal.  Skin: Skin is warm, dry and intact. No rash noted.  Psychiatric: He has a normal mood and affect. His speech is normal and behavior is normal. Judgment normal.    Assessment & Plan:   The patient's preventative maintenance and recommended screening tests for an annual wellness exam were reviewed in full today.  Brought up to date unless services declined.  Counselled on the importance of diet, exercise, and its role in overall health and mortality.  The patient's FH and SH was reviewed, including their home life, tobacco status, and drug and alcohol status.   Vaccines Td, flu uptodate, with DM candidate for PNA, refused at at this time. Colon: 06/2014, Dr. Fuller Plan  No polyps, repeat in 5 years  Prostate: Stable PSA.  Lab Results  Component Value Date   PSA 1.91 05/19/2015   PSA 1.57 04/26/2014   PSA 1.88 03/07/2013  HIV: refused nonsmoker Hep C: done

## 2015-05-23 NOTE — Assessment & Plan Note (Signed)
Due fpor re-eval.. Will do with next labs.

## 2015-05-27 ENCOUNTER — Other Ambulatory Visit: Payer: Self-pay | Admitting: Family Medicine

## 2015-06-16 ENCOUNTER — Other Ambulatory Visit: Payer: Self-pay | Admitting: Family Medicine

## 2015-07-12 ENCOUNTER — Other Ambulatory Visit: Payer: Self-pay | Admitting: Family Medicine

## 2015-07-20 IMAGING — CR DG CERVICAL SPINE COMPLETE 4+V
6 series · 6 of 6 positions shown · non-contrast
Comparison: None.

CLINICAL DATA: Neck pain and decreased range of motion and neck

CERVICAL SPINE - COMPLETE 4+ VIEW

[view not recorded (1 of 6)]
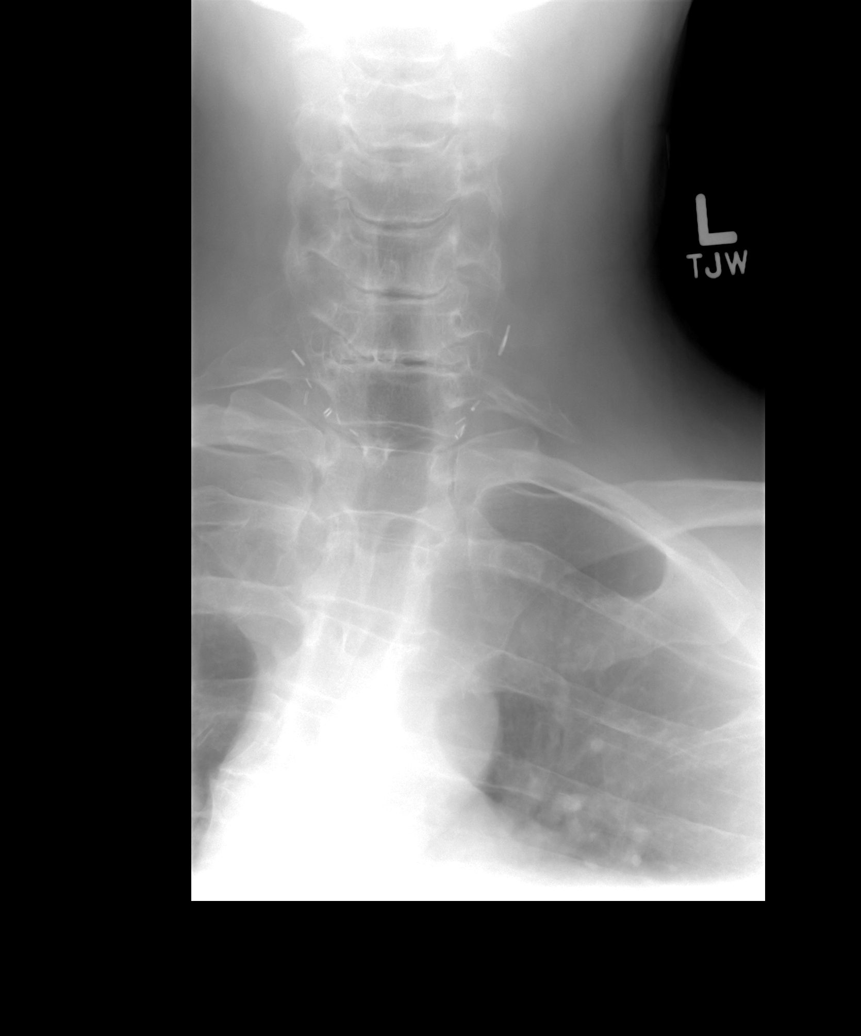

[view not recorded (2 of 6)]
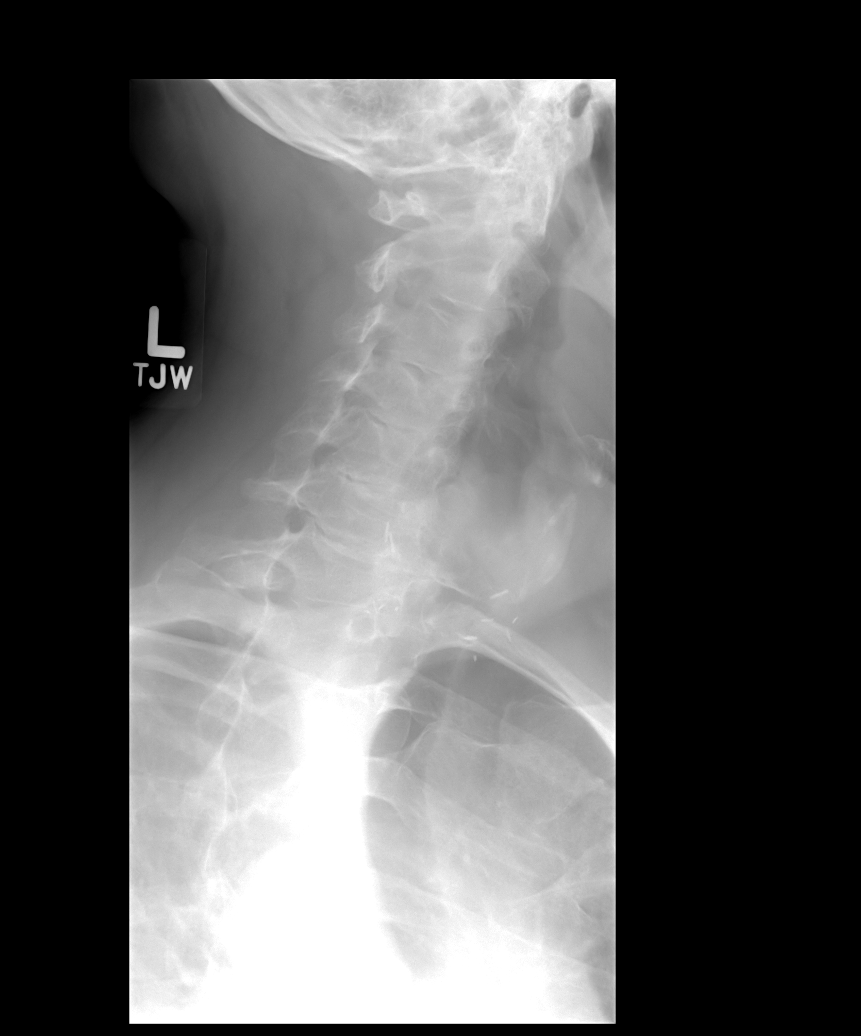

[view not recorded (3 of 6)]
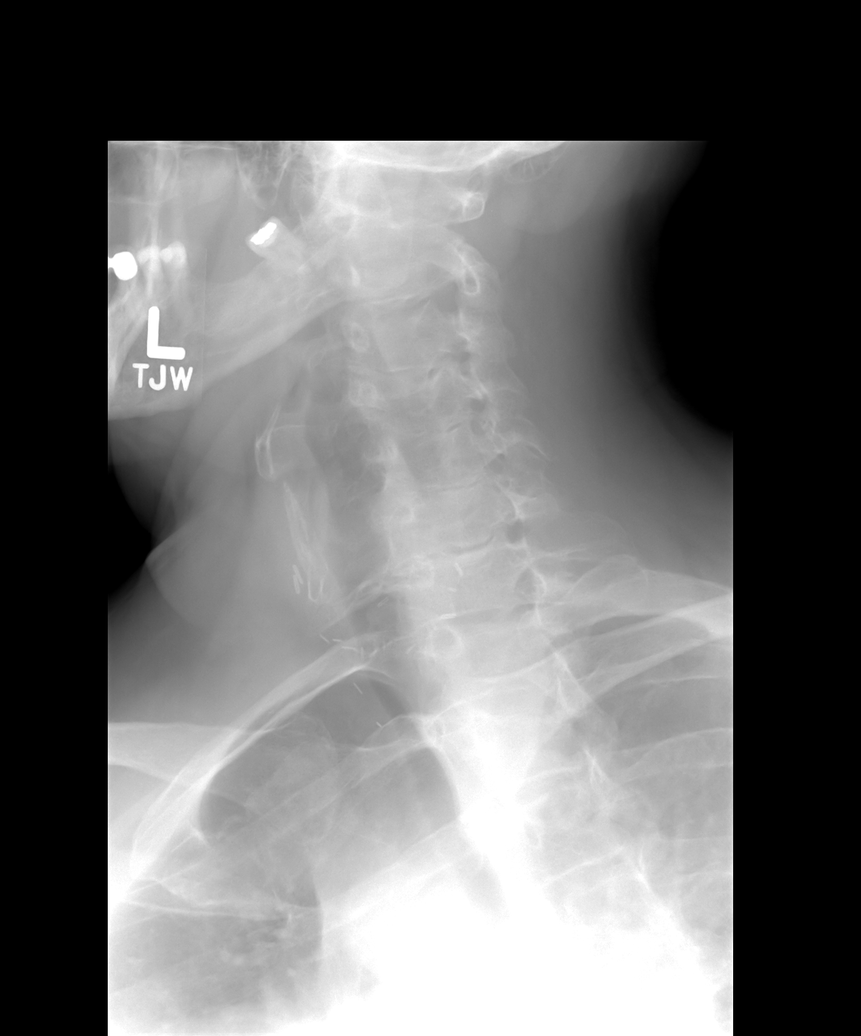

[view not recorded (4 of 6)]
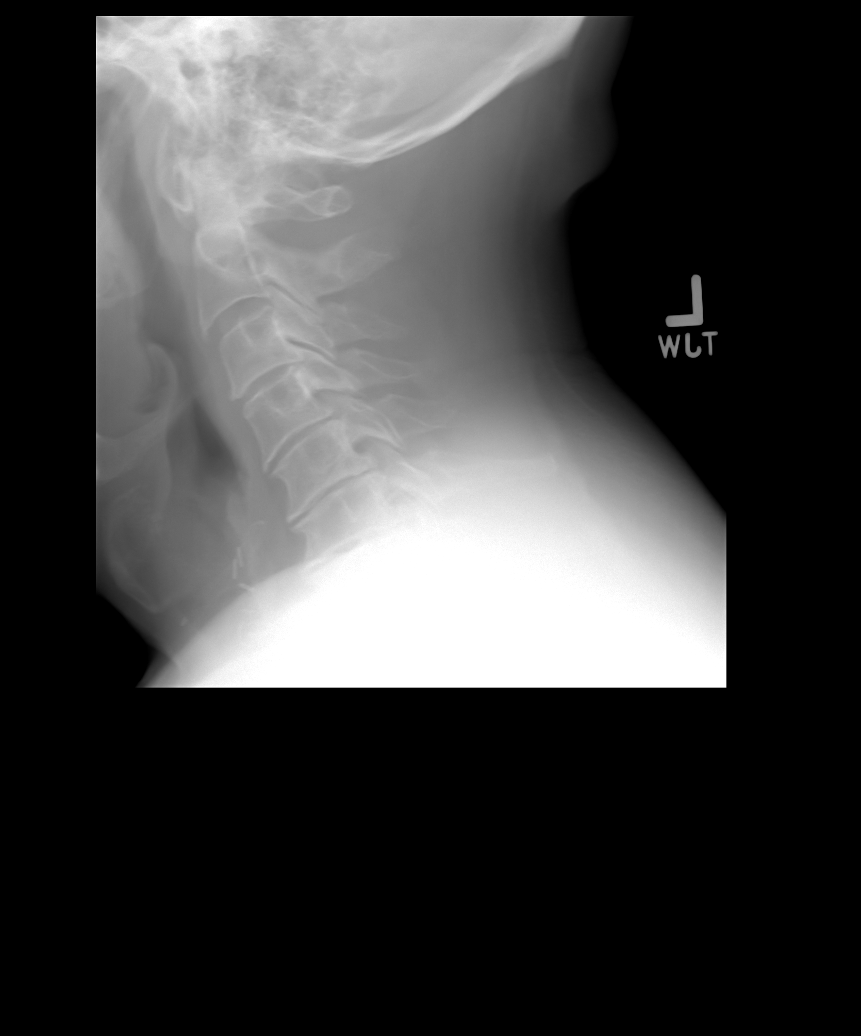

[view not recorded (5 of 6)]
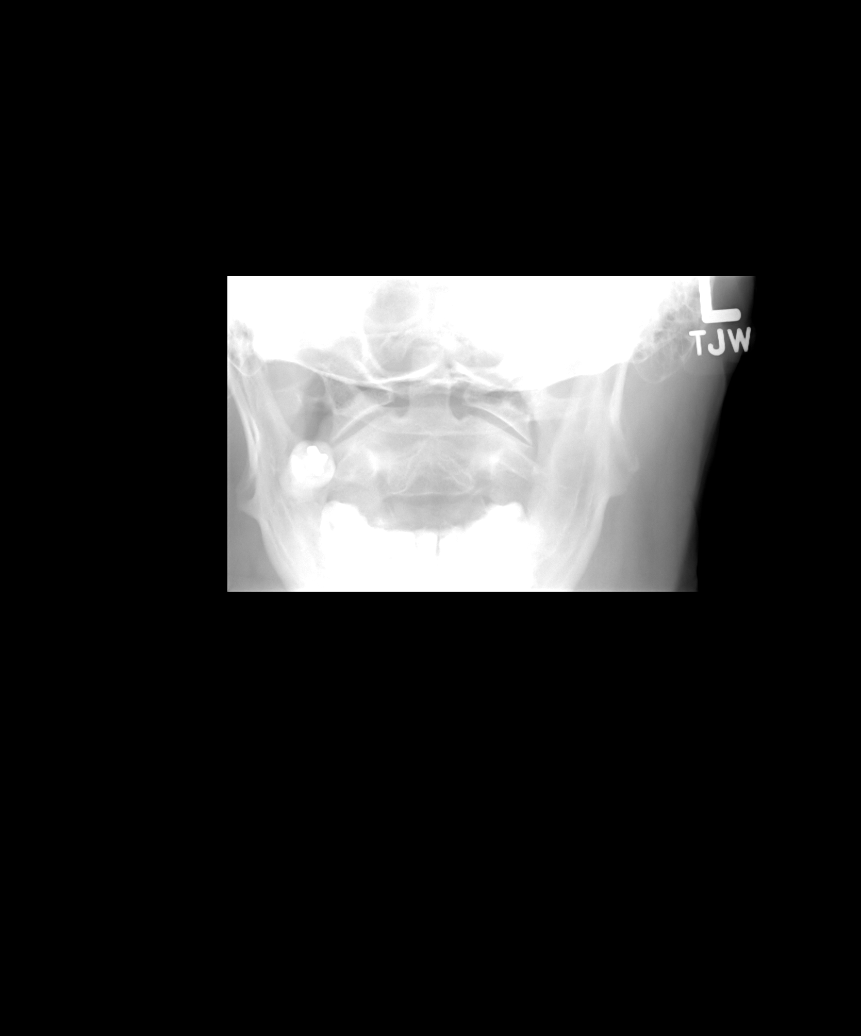

[view not recorded (6 of 6)]
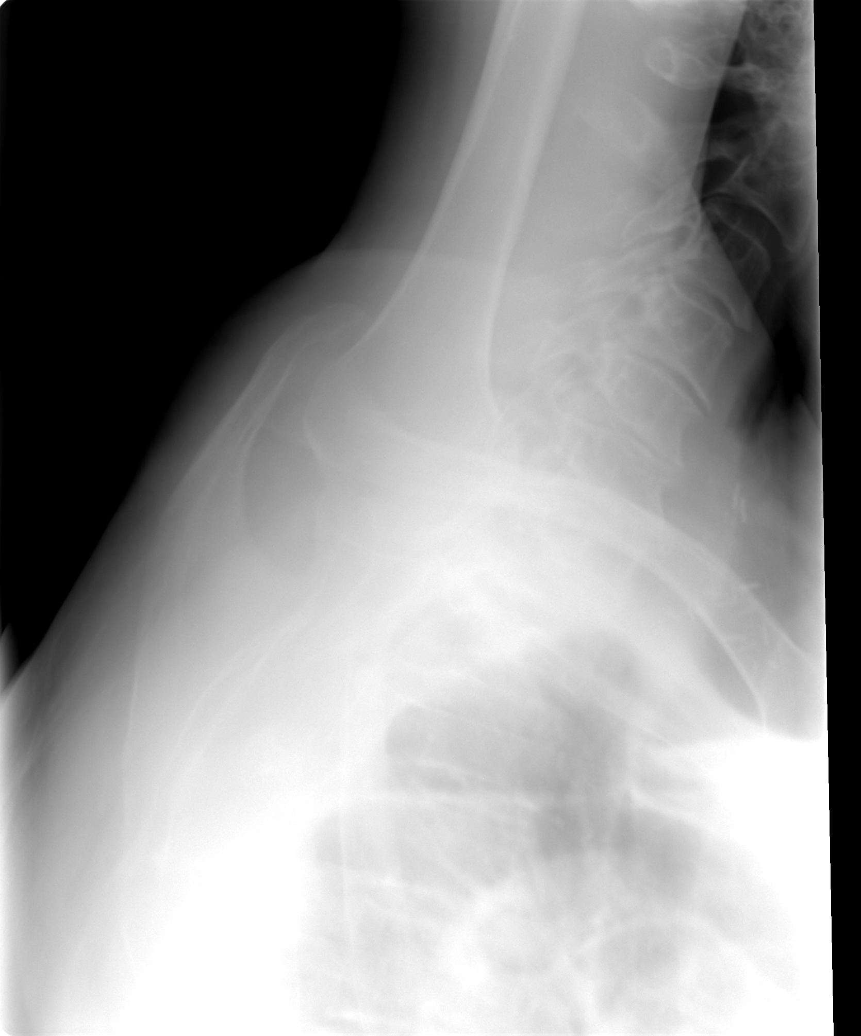

[6 of 6 positions shown; findings below may reference images not displayed]

FINDINGS: Frontal, lateral, open mouth odontoid, and bilateral
oblique views were obtained.  There is no fracture or
spondylolisthesis.  Prevertebral soft tissues and predental space
regions are normal.

There is moderately severe disc space narrowing at C3-4, C4-5, C5-
6, C6-7.  There are anterior osteophytes at C3, C4, C5, C6, and C7.
There is exit foraminal narrowing due to facet hypertrophy at C4-5
bilaterally, C5-6 on the right, and C6-7 bilaterally.  No erosive
change.

There are small cervical ribs bilaterally.  There is upper thoracic
dextroscoliosis.
IMPRESSION: Multilevel osteoarthritic change.  No fracture or
spondylolisthesis.

Small cervical ribs bilaterally.

Upper thoracic dextroscoliosis.

Comment:  Surgical clips are present in the lower neck region
bilaterally.  The distribution] suggests previous surgery involving
the thyroid.

## 2015-07-24 ENCOUNTER — Ambulatory Visit (INDEPENDENT_AMBULATORY_CARE_PROVIDER_SITE_OTHER): Payer: PRIVATE HEALTH INSURANCE | Admitting: Family Medicine

## 2015-07-24 ENCOUNTER — Encounter: Payer: Self-pay | Admitting: Family Medicine

## 2015-07-24 VITALS — BP 120/80 | HR 64 | Temp 97.6°F | Ht 67.0 in | Wt 212.5 lb

## 2015-07-24 DIAGNOSIS — G8929 Other chronic pain: Secondary | ICD-10-CM

## 2015-07-24 DIAGNOSIS — K219 Gastro-esophageal reflux disease without esophagitis: Secondary | ICD-10-CM | POA: Diagnosis not present

## 2015-07-24 DIAGNOSIS — M542 Cervicalgia: Secondary | ICD-10-CM

## 2015-07-24 DIAGNOSIS — R079 Chest pain, unspecified: Secondary | ICD-10-CM

## 2015-07-24 DIAGNOSIS — R1013 Epigastric pain: Secondary | ICD-10-CM | POA: Diagnosis not present

## 2015-07-24 LAB — COMPREHENSIVE METABOLIC PANEL
ALBUMIN: 4.6 g/dL (ref 3.5–5.2)
ALT: 46 U/L (ref 0–53)
AST: 27 U/L (ref 0–37)
Alkaline Phosphatase: 57 U/L (ref 39–117)
BILIRUBIN TOTAL: 0.8 mg/dL (ref 0.2–1.2)
BUN: 21 mg/dL (ref 6–23)
CALCIUM: 9.6 mg/dL (ref 8.4–10.5)
CO2: 31 meq/L (ref 19–32)
CREATININE: 1.38 mg/dL (ref 0.40–1.50)
Chloride: 101 mEq/L (ref 96–112)
GFR: 56.15 mL/min — ABNORMAL LOW (ref >60.00)
Glucose, Bld: 92 mg/dL (ref 70–99)
Potassium: 4 mEq/L (ref 3.5–5.1)
SODIUM: 138 meq/L (ref 135–145)
Total Protein: 6.9 g/dL (ref 6.0–8.3)

## 2015-07-24 LAB — LIPASE: LIPASE: 12 U/L (ref 11.0–59.0)

## 2015-07-24 MED ORDER — DEXLANSOPRAZOLE 60 MG PO CPDR: 60.0000 mg | DELAYED_RELEASE_CAPSULE | Freq: Every day | ORAL | Status: DC

## 2015-07-24 NOTE — Assessment & Plan Note (Signed)
Most consistent with GI related pain but given CAD, will eval an EKG for stability.

## 2015-07-24 NOTE — Patient Instructions (Addendum)
Stop diclofenac. Stop at lab on way out.  Hold protonix and change to dexilant if insurance covered. If not..We will plan to use protonix along with twice daily Zantac or pepcid AC. Avoid common triggers.  EKG stable from last check.

## 2015-07-24 NOTE — Assessment & Plan Note (Signed)
Likely worsened by diclofenac. Will eval with labs for pancreas issue, liver and Hpylori. Change to dexilant or add H2 blocker. Could also consider carafate.  May need referral to endo if not improving.

## 2015-07-24 NOTE — Progress Notes (Signed)
Pre visit review using our clinic review tool, if applicable. No additional management support is needed unless otherwise documented below in the visit note. 

## 2015-07-24 NOTE — Progress Notes (Signed)
   Subjective:    Patient ID: Brett Lowery, male    DOB: [birth year], 60 y.o.   MRN: [id number]  HPI   60 year old male with history of GERD, hx of CAD. DM presents with worsening of GERD in last few weeks.  He reports he has been having dull pain and pressure in chest . Worse when lying dowm at night. Pain radiates through back.  Some trouble breathing when it is really bad.  Epigastric pain as well. If sit and sleeps in chair it better.  No exertional chest pain. Is tired  N/V, no D/C. No blood in stool or dark stool.  He is using protonix 40 mg daily. If he does not take this.. It is much worse. No specific triggers. Nexium did not help as much in past.  He is using diclofenac for chronic neck pain, daily to 2 times daily. He has been using it like this for 2 years.  2015 myoview stress test: no sign of ischemia. Dr. Johnsie Cancel cardiologist, has not seen him since.  Never had endoscopy.  Review of Systems  Constitutional: Negative for fever and fatigue.  HENT: Negative for ear pain.   Eyes: Negative for pain.  Respiratory: Positive for chest tightness and shortness of breath. Negative for wheezing.   Cardiovascular: Positive for chest pain. Negative for palpitations and leg swelling.  Gastrointestinal: Negative for abdominal pain and blood in stool.       Objective:   Physical Exam  Constitutional: Vital signs are normal. He appears well-developed and well-nourished.  HENT:  Head: Normocephalic.  Right Ear: Hearing normal.  Left Ear: Hearing normal.  Nose: Nose normal.  Mouth/Throat: Oropharynx is clear and moist and mucous membranes are normal.  Neck: Trachea normal. Carotid bruit is not present. No thyroid mass and no thyromegaly present.  Cardiovascular: Normal rate, regular rhythm and normal pulses.  Exam reveals no gallop, no distant heart sounds and no friction rub.   No murmur heard. No peripheral edema  Pulmonary/Chest: Effort normal and breath sounds normal. No  respiratory distress.  Skin: Skin is warm, dry and intact. No rash noted.  Psychiatric: He has a normal mood and affect. His speech is normal and behavior is normal. Thought content normal.          Assessment & Plan:

## 2015-07-24 NOTE — Assessment & Plan Note (Signed)
Hold NSAID.. May nee to start tramadol or vicodin to treat.

## 2015-10-13 LAB — HM DIABETES EYE EXAM

## 2015-11-16 ENCOUNTER — Other Ambulatory Visit: Payer: Self-pay | Admitting: Family Medicine

## 2015-11-18 ENCOUNTER — Other Ambulatory Visit (INDEPENDENT_AMBULATORY_CARE_PROVIDER_SITE_OTHER): Payer: PRIVATE HEALTH INSURANCE

## 2015-11-18 ENCOUNTER — Telehealth: Payer: Self-pay | Admitting: Family Medicine

## 2015-11-18 DIAGNOSIS — E119 Type 2 diabetes mellitus without complications: Secondary | ICD-10-CM | POA: Diagnosis not present

## 2015-11-18 DIAGNOSIS — E890 Postprocedural hypothyroidism: Secondary | ICD-10-CM | POA: Diagnosis not present

## 2015-11-18 DIAGNOSIS — E7800 Pure hypercholesterolemia, unspecified: Secondary | ICD-10-CM | POA: Diagnosis not present

## 2015-11-18 LAB — COMPREHENSIVE METABOLIC PANEL
ALBUMIN: 4 g/dL (ref 3.5–5.2)
ALK PHOS: 63 U/L (ref 39–117)
ALT: 39 U/L (ref 0–53)
AST: 24 U/L (ref 0–37)
BUN: 22 mg/dL (ref 6–23)
CHLORIDE: 102 meq/L (ref 96–112)
CO2: 35 mEq/L — ABNORMAL HIGH (ref 19–32)
Calcium: 9 mg/dL (ref 8.4–10.5)
Creatinine, Ser: 1.25 mg/dL (ref 0.40–1.50)
GFR: 62.87 mL/min (ref >60.00)
Glucose, Bld: 118 mg/dL — ABNORMAL HIGH (ref 70–99)
POTASSIUM: 4.1 meq/L (ref 3.5–5.1)
SODIUM: 142 meq/L (ref 135–145)
TOTAL PROTEIN: 6.3 g/dL (ref 6.0–8.3)
Total Bilirubin: 0.6 mg/dL (ref 0.2–1.2)

## 2015-11-18 LAB — T3, FREE: T3, Free: 3.6 pg/mL (ref 2.3–4.2)

## 2015-11-18 LAB — LIPID PANEL
CHOLESTEROL: 211 mg/dL — AB (ref 0–200)
HDL: 36.7 mg/dL — AB (ref >39.00)
LDL Cholesterol: 138 mg/dL — ABNORMAL HIGH (ref 0–99)
NONHDL: 174.52
TRIGLYCERIDES: 182 mg/dL — AB (ref 0.0–149.0)
Total CHOL/HDL Ratio: 6
VLDL: 36.4 mg/dL (ref 0.0–40.0)

## 2015-11-18 LAB — HEMOGLOBIN A1C: HEMOGLOBIN A1C: 6.4 % (ref 4.6–6.5)

## 2015-11-18 LAB — TSH: TSH: 1.46 u[IU]/mL (ref 0.35–4.50)

## 2015-11-18 LAB — T4, FREE: Free T4: 1.18 ng/dL (ref 0.60–1.60)

## 2015-11-18 NOTE — Telephone Encounter (Signed)
-----   Message from Ellamae Sia sent at 11/13/2015 10:30 AM EDT ----- Regarding: Lab orders for Tuesday, 9.26.17 Lab orders for a 6 month follow up appt

## 2015-11-25 ENCOUNTER — Encounter: Payer: Self-pay | Admitting: Family Medicine

## 2015-11-25 ENCOUNTER — Ambulatory Visit (INDEPENDENT_AMBULATORY_CARE_PROVIDER_SITE_OTHER): Payer: PRIVATE HEALTH INSURANCE | Admitting: Family Medicine

## 2015-11-25 VITALS — BP 118/80 | HR 70 | Temp 98.3°F | Ht 67.0 in | Wt 215.8 lb

## 2015-11-25 DIAGNOSIS — Z23 Encounter for immunization: Secondary | ICD-10-CM

## 2015-11-25 DIAGNOSIS — I10 Essential (primary) hypertension: Secondary | ICD-10-CM | POA: Diagnosis not present

## 2015-11-25 DIAGNOSIS — E890 Postprocedural hypothyroidism: Secondary | ICD-10-CM

## 2015-11-25 DIAGNOSIS — R079 Chest pain, unspecified: Secondary | ICD-10-CM

## 2015-11-25 DIAGNOSIS — E119 Type 2 diabetes mellitus without complications: Secondary | ICD-10-CM | POA: Diagnosis not present

## 2015-11-25 DIAGNOSIS — E7800 Pure hypercholesterolemia, unspecified: Secondary | ICD-10-CM | POA: Diagnosis not present

## 2015-11-25 DIAGNOSIS — G8929 Other chronic pain: Secondary | ICD-10-CM

## 2015-11-25 DIAGNOSIS — R0609 Other forms of dyspnea: Secondary | ICD-10-CM

## 2015-11-25 DIAGNOSIS — R5383 Other fatigue: Secondary | ICD-10-CM

## 2015-11-25 DIAGNOSIS — I2510 Atherosclerotic heart disease of native coronary artery without angina pectoris: Secondary | ICD-10-CM

## 2015-11-25 LAB — CBC WITH DIFFERENTIAL/PLATELET
BASOS PCT: 0.6 % (ref 0.0–3.0)
Basophils Absolute: 0 10*3/uL (ref 0.0–0.1)
EOS ABS: 0.2 10*3/uL (ref 0.0–0.7)
Eosinophils Relative: 3.1 % (ref 0.0–5.0)
HCT: 43.8 % (ref 39.0–52.0)
HEMOGLOBIN: 15 g/dL (ref 13.0–17.0)
LYMPHS PCT: 26.3 % (ref 12.0–46.0)
Lymphs Abs: 1.8 10*3/uL (ref 0.7–4.0)
MCHC: 34.2 g/dL (ref 30.0–36.0)
MCV: 85.1 fl (ref 78.0–100.0)
MONO ABS: 0.5 10*3/uL (ref 0.1–1.0)
MONOS PCT: 7 % (ref 3.0–12.0)
NEUTROS ABS: 4.3 10*3/uL (ref 1.4–7.7)
NEUTROS PCT: 63 % (ref 43.0–77.0)
Platelets: 238 10*3/uL (ref 150.0–400.0)
RBC: 5.14 Mil/uL (ref 4.22–5.81)
RDW: 14 % (ref 11.5–15.5)
WBC: 6.8 10*3/uL (ref 4.0–10.5)

## 2015-11-25 LAB — VITAMIN B12: Vitamin B-12: 233 pg/mL (ref 211–911)

## 2015-11-25 LAB — VITAMIN D 25 HYDROXY (VIT D DEFICIENCY, FRACTURES): VITD: 37.68 ng/mL (ref 30.00–100.00)

## 2015-11-25 LAB — HM DIABETES FOOT EXAM

## 2015-11-25 MED ORDER — PRAVASTATIN SODIUM 10 MG PO TABS: 10.0000 mg | ORAL_TABLET | Freq: Every day | ORAL | 11 refills | Status: DC

## 2015-11-25 NOTE — Assessment & Plan Note (Signed)
Improved some with treatment of GERD. Not clearly related yto new issues of DOE and fatigue.

## 2015-11-25 NOTE — Assessment & Plan Note (Signed)
Stable control on current dose of medications.

## 2015-11-25 NOTE — Assessment & Plan Note (Signed)
If labs negative.. Given smoking history consider PFTs. Lung exam normal. Also consider ECHO although no clear sign of  CHF on exam.  Pt to return to cards for annual exam.

## 2015-11-25 NOTE — Assessment & Plan Note (Signed)
Recommend annual follow up with cardiology. Dr. Johnsie Cancel.

## 2015-11-25 NOTE — Progress Notes (Signed)
60 year old male presents for 6 month follow up DM.  GERD: On dexilant now, he feels that this has helped  A lot. Stopped diclofenac (only rarely using this medicaition). Lab eval neg (never returned Hpylori) at last OV 07/2015.   Still a dull constant ache, slightly worse when exerting himself.. But not burning ache as he had in past. He feels more fatigued in last several weeks. Feels short of breath with walking/ exerting self, ongoing in last 6-8 months. Has not seen Dr. Johnsie Cancel lately. He does feel stressed out a lot.. No depression, no panic attacks. No blood loss. Poor sleep at night, occ stops snores, breathing at night, never had sleep test. Does not feel fully rested. PGM sleep apnea. Occ .  former smoker, quit 10 years ago: smoked 20 packs year history  Diabetes: Well controlled on no med.  Lab Results  Component Value Date   HGBA1C 6.4 11/18/2015  Hypoglycemic episodes:?  Hyperglycemic episodes:?  Feet problems:None Blood Sugars averaging: not checking  eye exam within last year: coming up  Diet: moderate, decreasing sugar Exercise: walking 2-3 times a week  Body mass index is 33.79 kg/m. Wt Readings from Last 3 Encounters:  11/25/15 215 lb 12 oz (97.9 kg)  07/24/15 212 lb 8 oz (96.4 kg)  05/23/15 210 lb 12 oz (95.6 kg)   Elevated Cholesterol: Still not at goal on no med... off crestor. Stopped given back pain, hip pain.  Lab Results  Component Value Date   CHOL 211 (H) 11/18/2015   HDL 36.70 (L) 11/18/2015   LDLCALC 138 (H) 11/18/2015   TRIG 182.0 (H) 11/18/2015   CHOLHDL 6 11/18/2015  Muscle aches: None   Hypertension: Well controlleld on Amlodipine, HCTZ BP Readings from Last 3 Encounters:  11/25/15 118/80  07/24/15 120/80  05/23/15 110/70   No swelling in ankles. Not checking at home.   Hyperthyroidism. Pt on levothyroxine 137 mcg daily. Lab Results  Component Value Date   TSH 1.46 11/18/2015    Social History /Family History/Past  Medical History reviewed and updated if needed.   Review of Systems  Constitutional: Negative for fever, fatigue and unexpected weight change.  HENT: Negative for ear pain, congestion, snot after meals, present constantly.ore throat, rhinorrhea, trouble swallowing, neck pain and postnasal drip.  Eyes: Negative for pain.  Respiratory: Negative for cough, shortness of breath and wheezing.  Cardiovascular: Positive for chest pain, . Negative for palpitations and leg swelling.  Gastrointestinal: Negative for nausea, abdominal pain, diarrhea, constipation and blood in stool.  Has gas and burping. Genitourinary: Negative for dysuria, urgency, hematuria, discharge, penile swelling, scrotal swelling, difficulty urinating, penile pain and testicular pain.  Skin: Negative for rash.  Neurological: Negative for syncope, weakness, light-headedness, numbness and headaches.  Psychiatric/Behavioral: Negative for behavioral problems and dysphoric mood. The patient is not nervous/anxious.  Objective:   Physical Exam  Constitutional: He appears well-developed and well-nourished. Non-toxic appearance. He does not appear ill. No distress.  HENT:  Head: Normocephalic and atraumatic.  Right Ear: Hearing, tympanic membrane, external ear and ear canal normal.  Left Ear: Hearing, tympanic membrane, external ear and ear canal normal.  Nose: Nose normal.  Mouth/Throat: Uvula is midline, oropharynx is clear and moist and mucous membranes are normal.  Eyes: Conjunctivae normal, EOM and lids are normal. Pupils are equal, round, and reactive to light. No foreign bodies found.  Neck: Trachea normal, normal range of motion and phonation normal. Neck supple. Carotid bruit is not present. No mass and  no thyromegaly present.  Cardiovascular: Normal rate, regular rhythm, S1 normal, S2 normal, intact distal pulses and normal pulses. Exam reveals no gallop.  No murmur heard.  Pulmonary/Chest: Breath  sounds normal. He has no wheezes. He has no rhonchi. He has no rales.  Abdominal: Soft. Normal appearance and bowel sounds are normal. There is no hepatosplenomegaly. There is no tenderness. There is no rebound, no guarding and no CVA tenderness. No hernia. Hernia confirmed negative in the right inguinal area and confirmed negative in the left inguinal area.  Genitourinary: Prostate normal, testes normal and penis normal. Rectal exam shows no external hemorrhoid, no internal hemorrhoid, no fissure, no mass, no tenderness and anal tone normal. Guaiac negative stool. Prostate is not enlarged and not tender. Right testis shows no mass and no tenderness. Left testis shows no mass and no tenderness. No paraphimosis or penile tenderness.  Lymphadenopathy:  He has no cervical adenopathy.  Right: No inguinal adenopathy present.  Left: No inguinal adenopathy present.  Neurological: He is alert. He has normal strength and normal reflexes. No cranial nerve deficit or sensory deficit. Gait normal.  Skin: Skin is warm, dry and intact. No rash noted.  Psychiatric: He has a normal mood and affect. His speech is normal and behavior is normal. Judgment normal.  Diabetic foot exam: Normal inspection No skin breakdown No calluses  Normal DP pulses Normal sensation to light touch and monofilament Nails normal

## 2015-11-25 NOTE — Assessment & Plan Note (Signed)
Not at goal. Start low dose pravastatin and titrate up as able. Recheck chol in 3 months.

## 2015-11-25 NOTE — Progress Notes (Signed)
Pre visit review using our clinic review tool, if applicable. No additional management support is needed unless otherwise documented below in the visit note. 

## 2015-11-25 NOTE — Assessment & Plan Note (Signed)
Eval with labs. Likely sleep apnea.. Will need further eval.  No clear cardiac source,? Related to respiratory issues.

## 2015-11-25 NOTE — Patient Instructions (Addendum)
Start low dose pravastatin 10 mg three days a week. Try to increase slowly to daily as tolerated.  Let me know if have any side effects even at the low dose.  Stop at lab on way out.  Make an appt for cardiology annual follow up with Dr. Johnsie Cancel.

## 2015-11-25 NOTE — Assessment & Plan Note (Signed)
Excellent control with diet.  Continue exercsie, work on weight loss.

## 2016-01-26 IMAGING — CT CT CHEST W/O CM
2 of 4 series · 15 of 36 positions shown, 18 images · non-contrast
Comparison: MR SHOULDER*R* W/O CM dated 01/18/2011; DG CHEST 1V
PORT dated 04/17/2007; CT ABD/PELVIS W CM dated 09/17/2011

CLINICAL DATA: Follow-up lung nodule, increased dyspnea on exertion

EXAM:
CT CHEST WITHOUT CONTRAST
TECHNIQUE: Multidetector CT imaging of the chest was performed following the
standard protocol without IV contrast.

[Series 2: chest routine with · axial · 0.81mm/px · z∈[-336,-62]mm · 12 of 65 slices shown, 15 images]
[im 5/65  mediastinal]
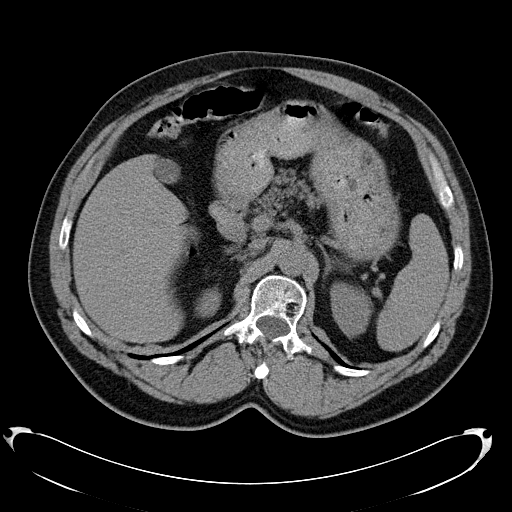
[im 5/65  lung]
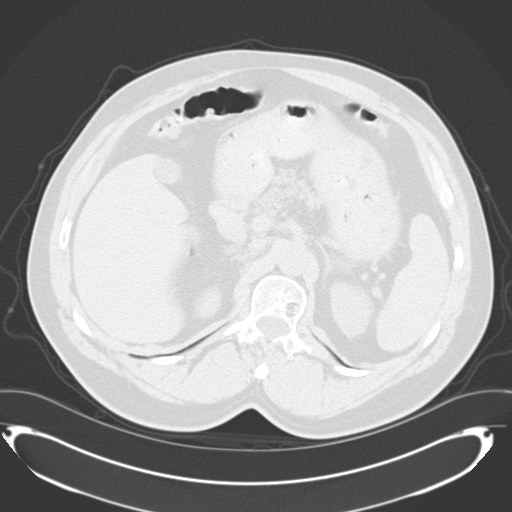
[im 10/65  lung]
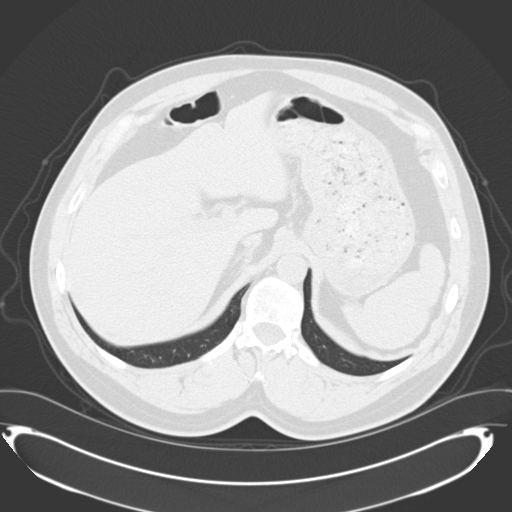
[im 15/65  lung]
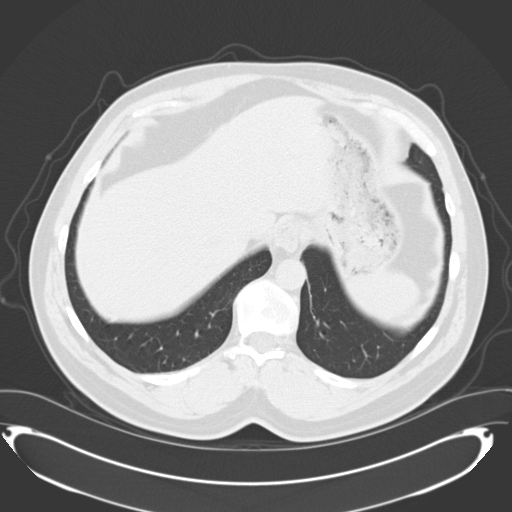
[im 20/65  lung]
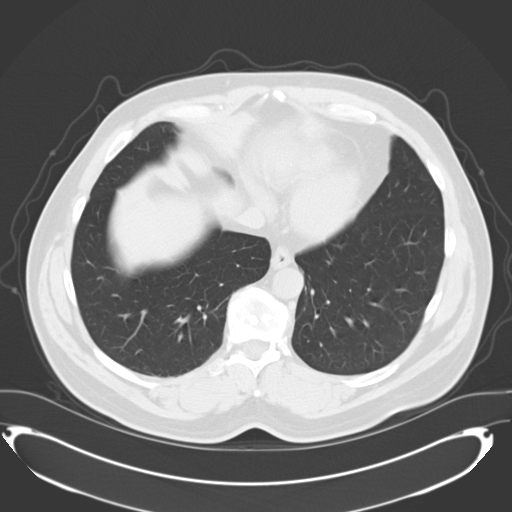
[im 25/65  mediastinal]
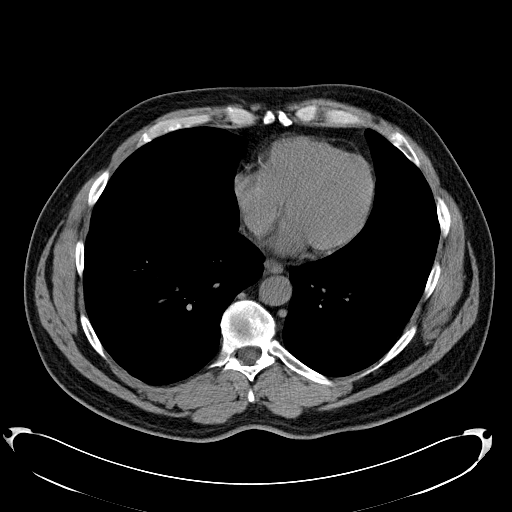
[im 25/65  lung]
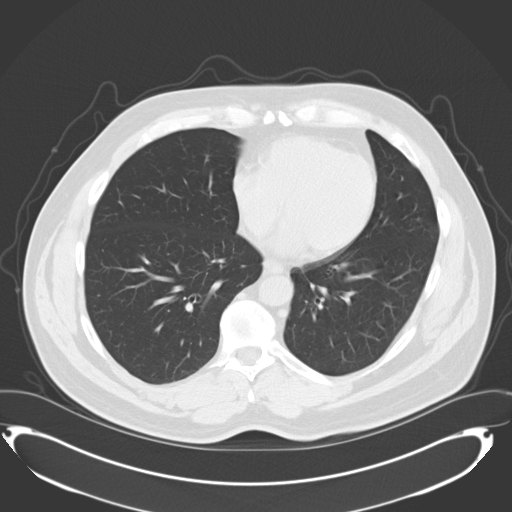
[im 30/65  lung]
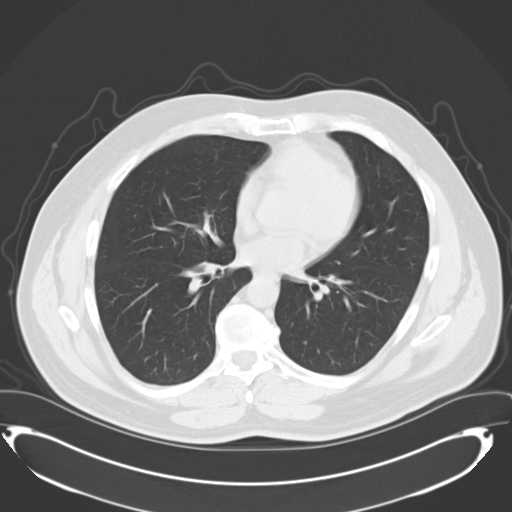
[im 35/65  lung]
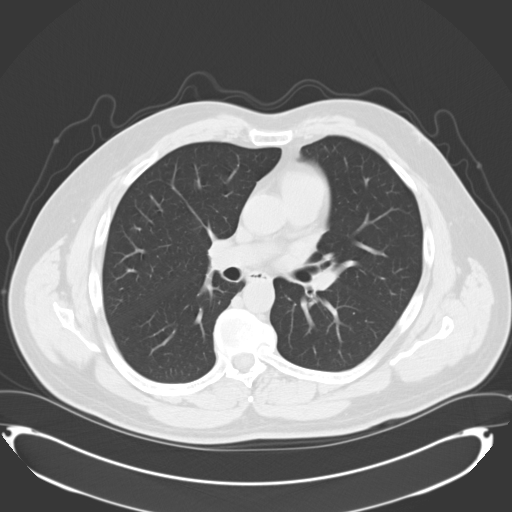
[im 40/65  lung]
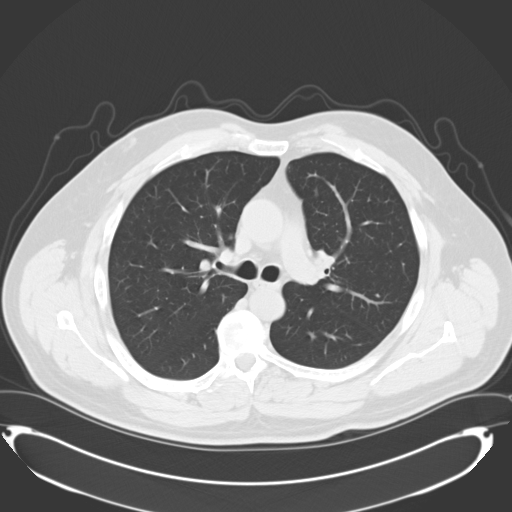
[im 45/65  mediastinal]
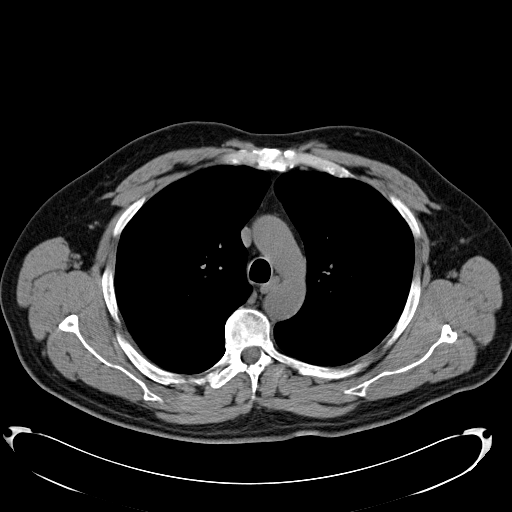
[im 45/65  lung]
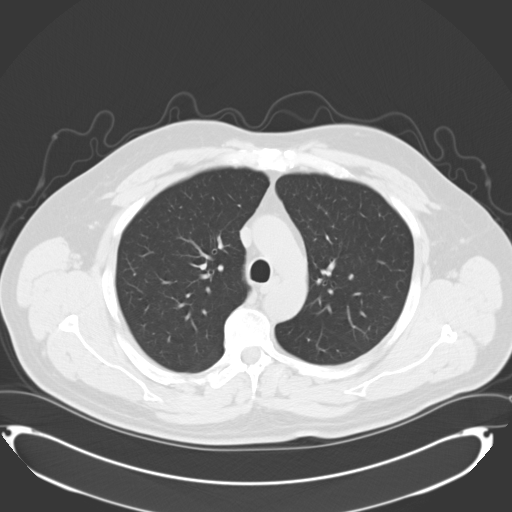
[im 50/65  lung]
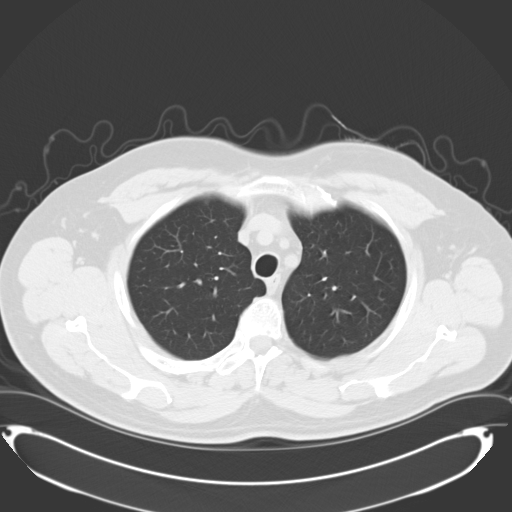
[im 55/65  lung]
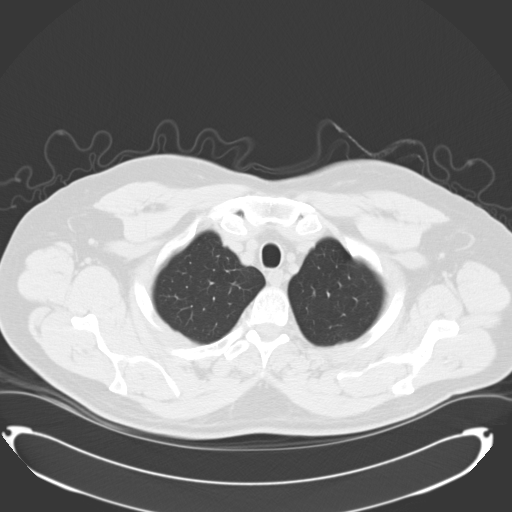
[im 60/65  lung]
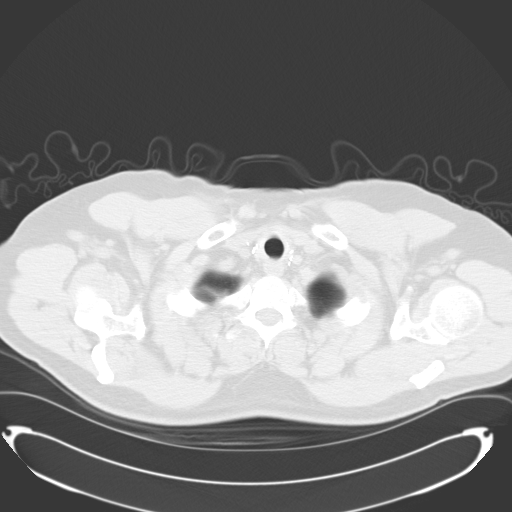

[Series 602: cor · coronal · 0.81mm/px · 3 of 114 slices shown]
[im 23/114  lung]
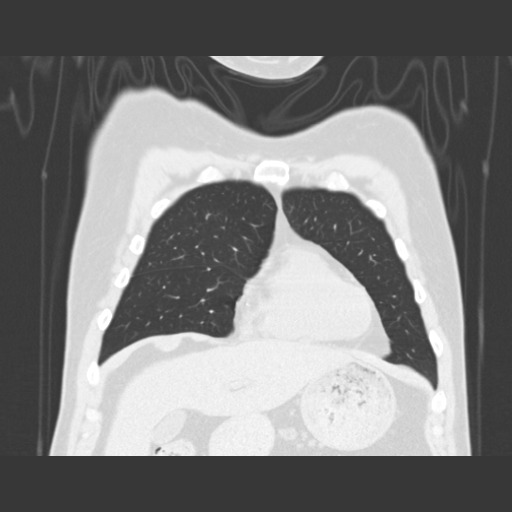
[im 46/114  lung]
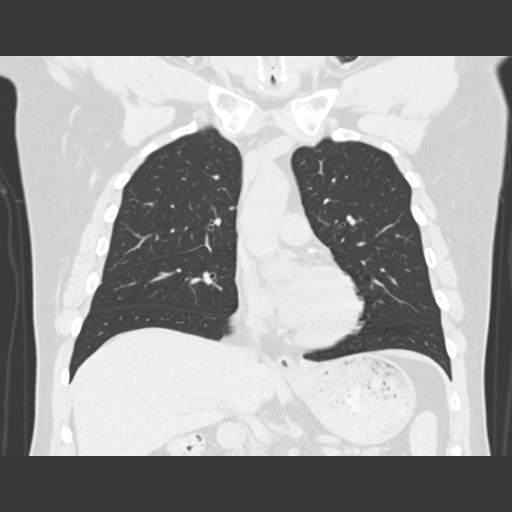
[im 68/114  lung]
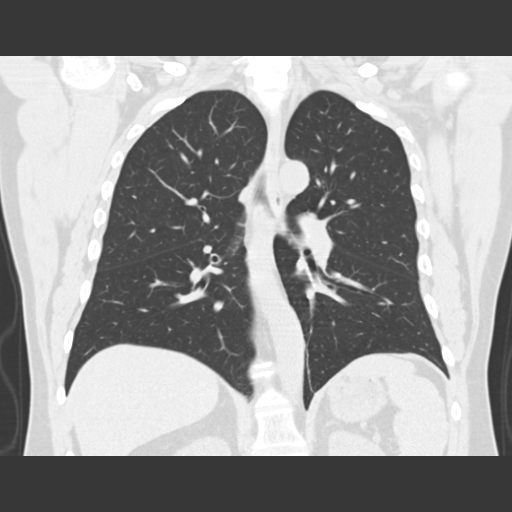

[15 of 36 positions shown; findings below may reference images not displayed]

FINDINGS: 3 mm pulmonary nodule lateral left lung base image number 48, stable
in size. It appears calcified, and this is appreciated best on the
coronal images. . No other abnormal parenchymal opacities.

No significant hilar or mediastinal adenopathy. Minimal coronary
arterial calcification. Mediastinal contents normal. Minimal
gynecomastia. No pleural effusions. Scans through the upper abdomen
are negative. There are no acute musculoskeletal findings. There is
moderate dextroscoliosis of the thoracic spine.
IMPRESSION: Stable 3 mm pulmonary nodule left lower lobe. It appears consistent
with a benign granuloma.

## 2016-02-24 ENCOUNTER — Ambulatory Visit: Payer: Self-pay | Admitting: Family Medicine

## 2016-05-11 ENCOUNTER — Other Ambulatory Visit: Payer: Self-pay | Admitting: Family Medicine

## 2016-05-16 ENCOUNTER — Other Ambulatory Visit: Payer: Self-pay | Admitting: Family Medicine

## 2016-05-25 ENCOUNTER — Other Ambulatory Visit: Payer: Self-pay | Admitting: Family Medicine

## 2016-05-25 ENCOUNTER — Telehealth: Payer: Self-pay | Admitting: Family Medicine

## 2016-05-25 DIAGNOSIS — E7800 Pure hypercholesterolemia, unspecified: Secondary | ICD-10-CM

## 2016-05-25 DIAGNOSIS — Z125 Encounter for screening for malignant neoplasm of prostate: Secondary | ICD-10-CM

## 2016-05-25 DIAGNOSIS — E119 Type 2 diabetes mellitus without complications: Secondary | ICD-10-CM

## 2016-05-25 DIAGNOSIS — E890 Postprocedural hypothyroidism: Secondary | ICD-10-CM

## 2016-05-25 NOTE — Progress Notes (Signed)
k

## 2016-05-25 NOTE — Telephone Encounter (Signed)
-----   Message from Ellamae Sia sent at 05/14/2016 11:38 AM EDT ----- Regarding: Lab orders for Wednesday, 4.3.18 Patient is scheduled for CPX labs, please order future labs, Thanks , Karna Christmas

## 2016-05-26 ENCOUNTER — Other Ambulatory Visit (INDEPENDENT_AMBULATORY_CARE_PROVIDER_SITE_OTHER): Payer: PRIVATE HEALTH INSURANCE

## 2016-05-26 ENCOUNTER — Encounter (INDEPENDENT_AMBULATORY_CARE_PROVIDER_SITE_OTHER): Payer: Self-pay

## 2016-05-26 DIAGNOSIS — E890 Postprocedural hypothyroidism: Secondary | ICD-10-CM | POA: Diagnosis not present

## 2016-05-26 DIAGNOSIS — E119 Type 2 diabetes mellitus without complications: Secondary | ICD-10-CM

## 2016-05-26 DIAGNOSIS — E7800 Pure hypercholesterolemia, unspecified: Secondary | ICD-10-CM

## 2016-05-26 DIAGNOSIS — Z125 Encounter for screening for malignant neoplasm of prostate: Secondary | ICD-10-CM | POA: Diagnosis not present

## 2016-05-26 LAB — COMPREHENSIVE METABOLIC PANEL
ALK PHOS: 52 U/L (ref 39–117)
ALT: 26 U/L (ref 0–53)
AST: 21 U/L (ref 0–37)
Albumin: 4.2 g/dL (ref 3.5–5.2)
BILIRUBIN TOTAL: 0.7 mg/dL (ref 0.2–1.2)
BUN: 21 mg/dL (ref 6–23)
CO2: 33 mEq/L — ABNORMAL HIGH (ref 19–32)
Calcium: 9.5 mg/dL (ref 8.4–10.5)
Chloride: 102 mEq/L (ref 96–112)
Creatinine, Ser: 1.15 mg/dL (ref 0.40–1.50)
GFR: 69.1 mL/min (ref >60.00)
Glucose, Bld: 107 mg/dL — ABNORMAL HIGH (ref 70–99)
Potassium: 3.9 mEq/L (ref 3.5–5.1)
Sodium: 141 mEq/L (ref 135–145)
TOTAL PROTEIN: 6.5 g/dL (ref 6.0–8.3)

## 2016-05-26 LAB — LIPID PANEL
Cholesterol: 162 mg/dL (ref 0–200)
HDL: 37.3 mg/dL — AB (ref >39.00)
LDL Cholesterol: 106 mg/dL — ABNORMAL HIGH (ref 0–99)
NonHDL: 125.06
TRIGLYCERIDES: 95 mg/dL (ref 0.0–149.0)
Total CHOL/HDL Ratio: 4
VLDL: 19 mg/dL (ref 0.0–40.0)

## 2016-05-26 LAB — PSA: PSA: 1.86 ng/mL (ref 0.10–4.00)

## 2016-05-26 LAB — HEMOGLOBIN A1C: HEMOGLOBIN A1C: 6.6 % — AB (ref 4.6–6.5)

## 2016-05-26 LAB — MICROALBUMIN / CREATININE URINE RATIO
CREATININE, U: 157.7 mg/dL
Microalb Creat Ratio: 0.5 mg/g (ref 0.0–30.0)
Microalb, Ur: 0.8 mg/dL (ref 0.0–1.9)

## 2016-05-26 LAB — T4, FREE: Free T4: 1.29 ng/dL (ref 0.60–1.60)

## 2016-05-26 LAB — T3, FREE: T3 FREE: 3.8 pg/mL (ref 2.3–4.2)

## 2016-05-26 LAB — TSH: TSH: 1.03 u[IU]/mL (ref 0.35–4.50)

## 2016-06-01 ENCOUNTER — Ambulatory Visit (INDEPENDENT_AMBULATORY_CARE_PROVIDER_SITE_OTHER): Payer: PRIVATE HEALTH INSURANCE | Admitting: Family Medicine

## 2016-06-01 ENCOUNTER — Encounter: Payer: Self-pay | Admitting: Family Medicine

## 2016-06-01 VITALS — BP 112/64 | HR 77 | Temp 98.5°F | Ht 67.0 in | Wt 207.5 lb

## 2016-06-01 DIAGNOSIS — E119 Type 2 diabetes mellitus without complications: Secondary | ICD-10-CM

## 2016-06-01 DIAGNOSIS — R911 Solitary pulmonary nodule: Secondary | ICD-10-CM | POA: Diagnosis not present

## 2016-06-01 DIAGNOSIS — I10 Essential (primary) hypertension: Secondary | ICD-10-CM | POA: Diagnosis not present

## 2016-06-01 DIAGNOSIS — K219 Gastro-esophageal reflux disease without esophagitis: Secondary | ICD-10-CM | POA: Diagnosis not present

## 2016-06-01 DIAGNOSIS — Z0000 Encounter for general adult medical examination without abnormal findings: Secondary | ICD-10-CM | POA: Diagnosis not present

## 2016-06-01 DIAGNOSIS — E890 Postprocedural hypothyroidism: Secondary | ICD-10-CM

## 2016-06-01 DIAGNOSIS — I2510 Atherosclerotic heart disease of native coronary artery without angina pectoris: Secondary | ICD-10-CM | POA: Diagnosis not present

## 2016-06-01 DIAGNOSIS — E7800 Pure hypercholesterolemia, unspecified: Secondary | ICD-10-CM

## 2016-06-01 MED ORDER — LEVOTHYROXINE SODIUM 125 MCG PO TABS: ORAL_TABLET | ORAL | 3 refills | Status: DC

## 2016-06-01 MED ORDER — PRAVASTATIN SODIUM 10 MG PO TABS: 10.0000 mg | ORAL_TABLET | Freq: Every day | ORAL | 11 refills | Status: DC

## 2016-06-01 MED ORDER — DEXLANSOPRAZOLE 60 MG PO CPDR: 60.0000 mg | DELAYED_RELEASE_CAPSULE | Freq: Every day | ORAL | 3 refills | Status: DC

## 2016-06-01 MED ORDER — AMLODIPINE BESYLATE 5 MG PO TABS: 5.0000 mg | ORAL_TABLET | Freq: Every day | ORAL | 3 refills | Status: DC

## 2016-06-01 MED ORDER — HYDROCHLOROTHIAZIDE 25 MG PO TABS: 25.0000 mg | ORAL_TABLET | Freq: Every day | ORAL | 3 refills | Status: DC

## 2016-06-01 NOTE — Assessment & Plan Note (Signed)
Try to increase to 20 mg daily if able  Over time. Continue aggressive weight loss and diet changes.

## 2016-06-01 NOTE — Assessment & Plan Note (Signed)
Goal LD < 70. Work on risk factor modification.

## 2016-06-01 NOTE — Patient Instructions (Addendum)
Keep up exercise on healthy eating, low carb and low cholesterol and regular exercise.  Can try to increase pravastatin towards 20 mg daily if tolerating.

## 2016-06-01 NOTE — Assessment & Plan Note (Signed)
Well controlled. Continue current medication.  

## 2016-06-01 NOTE — Progress Notes (Signed)
Subjective:    Patient ID: Brett Lowery, male    DOB: August 13, 1955, 61 y.o.   MRN: [id number]  HPI   The patient is here for annual wellness exam and preventative care.    Diabetes:  Good control on no med.  Lab Results  Component Value Date   HGBA1C 6.6 (H) 05/26/2016  Using medications without difficulties: Hypoglycemic episodes: Hyperglycemic episodes: Feet problems: no  ulcers Blood Sugars averaging: eye exam within last year:  Elevated Cholesterol: on pravastatin 10 mg daily. SE to higher dose crestor  tolerating well. Lab Results  Component Value Date   CHOL 162 05/26/2016   HDL 37.30 (L) 05/26/2016   LDLCALC 106 (H) 05/26/2016   TRIG 95.0 05/26/2016   CHOLHDL 4 05/26/2016  Using medications without problems: none Muscle aches: none Diet compliance: moderate Exercise: walking 3-4 times a week. Other complaints:   Wt Readings from Last 3 Encounters:  06/01/16 207 lb 8 oz (94.1 kg)  11/25/15 215 lb 12 oz (97.9 kg)  07/24/15 212 lb 8 oz (96.4 kg)  Body mass index is 32.5 kg/m.   Hypertension:   Good control on  HCTZ, amlodipine  Using medication without problems or lightheadedness: none Chest pain with exertion: none Edema:none Short of breath: stable DOE Average home BPs: Other issues: HX of  CAD  Hypothyroid: Stable on levo 125 mcg.  Lab Results  Component Value Date   TSH 1.03 05/26/2016    GERD.Marland Kitchen Not better with dexilant.. Having chest pain and reflux daily.  Social History /Family History/Past Medical History reviewed and updated if needed. Blood pressure 112/64, pulse 77, temperature 98.5 F (36.9 C), temperature source Oral, height 5\' 7"  (1.702 m), weight 207 lb 8 oz (94.1 kg).   Review of Systems  Constitutional: Negative for fatigue and fever.  HENT: Negative for ear pain.   Eyes: Negative for pain.  Respiratory: Negative for cough and shortness of breath.   Cardiovascular: Negative for chest pain, palpitations and leg swelling.    Gastrointestinal: Negative for abdominal pain.  Genitourinary: Negative for dysuria.  Musculoskeletal: Negative for arthralgias.  Neurological: Negative for syncope, light-headedness and headaches.  Psychiatric/Behavioral: Negative for dysphoric mood.       Objective:   Physical Exam  Constitutional: He appears well-developed and well-nourished.  Non-toxic appearance. He does not appear ill. No distress.  HENT:  Head: Normocephalic and atraumatic.  Right Ear: Hearing, tympanic membrane, external ear and ear canal normal.  Left Ear: Hearing, tympanic membrane, external ear and ear canal normal.  Nose: Nose normal.  Mouth/Throat: Uvula is midline, oropharynx is clear and moist and mucous membranes are normal.  Eyes: Conjunctivae, EOM and lids are normal. Pupils are equal, round, and reactive to light. Lids are everted and swept, no foreign bodies found.  Neck: Trachea normal, normal range of motion and phonation normal. Neck supple. Carotid bruit is not present. No thyroid mass and no thyromegaly present.  Cardiovascular: Normal rate, regular rhythm, S1 normal, S2 normal, intact distal pulses and normal pulses.  Exam reveals no gallop.   No murmur heard. Pulmonary/Chest: Breath sounds normal. He has no wheezes. He has no rhonchi. He has no rales.  Abdominal: Soft. Normal appearance and bowel sounds are normal. There is no hepatosplenomegaly. There is no tenderness. There is no rebound, no guarding and no CVA tenderness. No hernia.  Possible  mildventral hernia, non-inflammaed or incarcerated.  Lymphadenopathy:    He has no cervical adenopathy.  Neurological: He is alert. He has normal  strength and normal reflexes. No cranial nerve deficit or sensory deficit. Gait normal.  Skin: Skin is warm, dry and intact. No rash noted.  Psychiatric: He has a normal mood and affect. His speech is normal and behavior is normal. Judgment normal.          Assessment & Plan:  The patient's  preventative maintenance and recommended screening tests for an annual wellness exam were reviewed in full today. Brought up to date unless services declined.  Counselled on the importance of diet, exercise, and its role in overall health and mortality. The patient's FH and SH was reviewed, including their home life, tobacco status, and drug and alcohol status.   Vaccines Td, flu uptodate, with DM candidate for PNA, refused at at this time. Colon: 06/2014, Dr. Fuller Plan  No polyps, repeat in 5 years  Prostate: Stable PSA.  Lab Results  Component Value Date   PSA 1.86 05/26/2016   PSA 1.91 05/19/2015   PSA 1.57 04/26/2014  HIV: refused nonsmoker Hep C: done Stable pulm nodule 2013-2015.Marland Kitchen No further imaging needed.

## 2016-06-01 NOTE — Assessment & Plan Note (Signed)
Stable control on current dose. 

## 2016-06-01 NOTE — Progress Notes (Signed)
Pre visit review using our clinic review tool, if applicable. No additional management support is needed unless otherwise documented below in the visit note. 

## 2016-06-01 NOTE — Assessment & Plan Note (Signed)
Poor control despite multiple PPIs. Refer to GI for further eval.

## 2016-06-01 NOTE — Assessment & Plan Note (Signed)
Good control with diet.

## 2016-06-10 ENCOUNTER — Other Ambulatory Visit: Payer: Self-pay | Admitting: Family Medicine

## 2016-06-21 ENCOUNTER — Other Ambulatory Visit: Payer: Self-pay | Admitting: Family Medicine

## 2016-06-28 ENCOUNTER — Encounter: Payer: Self-pay | Admitting: Gastroenterology

## 2016-06-28 ENCOUNTER — Encounter (INDEPENDENT_AMBULATORY_CARE_PROVIDER_SITE_OTHER): Payer: Self-pay

## 2016-06-28 ENCOUNTER — Ambulatory Visit (INDEPENDENT_AMBULATORY_CARE_PROVIDER_SITE_OTHER): Payer: PRIVATE HEALTH INSURANCE | Admitting: Gastroenterology

## 2016-06-28 VITALS — BP 104/66 | HR 68 | Ht 67.0 in | Wt 203.0 lb

## 2016-06-28 DIAGNOSIS — R0789 Other chest pain: Secondary | ICD-10-CM | POA: Diagnosis not present

## 2016-06-28 DIAGNOSIS — K219 Gastro-esophageal reflux disease without esophagitis: Secondary | ICD-10-CM | POA: Diagnosis not present

## 2016-06-28 NOTE — Patient Instructions (Signed)
You can take Tylenol or Advil as needed for pain.  You have been scheduled for an abdominal ultrasound at [facility] (1st floor of hospital) on 06/30/17 at 8:30am. Please arrive 15 minutes prior to your appointment for registration. Make certain not to have anything to eat or drink 6 hours prior to your appointment. Should you need to reschedule your appointment, please contact radiology at [phone]. This test typically takes about 30 minutes to perform.  You have been scheduled for an endoscopy. Please follow written instructions given to you at your visit today. If you use inhalers (even only as needed), please bring them with you on the day of your procedure. Your physician has requested that you go to www.startemmi.com and enter the access code given to you at your visit today. This web site gives a general overview about your procedure. However, you should still follow specific instructions given to you by our office regarding your preparation for the procedure.  Thank you for choosing me and [facility].  Pricilla Riffle. Dagoberto Ligas., MD., Marval Regal

## 2016-06-28 NOTE — Progress Notes (Signed)
History of Present Illness: This is a 61 year old male who relates ongoing problems with lower sternal chest pain. He is accompanied by his wife and grandson. He states for about 6 months he has noted intermittent lower sternal pain that occasionally bothers him on recumbency and bothers him at other times. The pain may last several minutes to a few hours at a time. He often notes a gas and belching but his chest pain is not relieved or worsened with belching. He has a long history of GERD for about 5 years. Heartburn and regurgitation symptoms have been well controlled on Nexium, Protonix and now Dexliant however the lower sternal area chest pain has not changed. He notes occasional shortness of breath with exertion. His chest pain does not appear to be exertional. Denies weight loss, abdominal pain, constipation, diarrhea, change in stool caliber, melena, hematochezia, nausea, vomiting, dysphagia.   No Known Allergies Outpatient Medications Prior to Visit  Medication Sig Dispense Refill  . amLODipine (NORVASC) 5 MG tablet Take 1 tablet (5 mg total) by mouth daily. 90 tablet 3  . aspirin 81 MG tablet Take 81 mg by mouth daily.      Marland Kitchen dexlansoprazole (DEXILANT) 60 MG capsule Take 1 capsule (60 mg total) by mouth daily. 90 capsule 3  . fluticasone (FLONASE) 50 MCG/ACT nasal spray SHAKE LIQUID AND USE 2 SPRAYS IN EACH NOSTRIL EVERY DAY 16 g 11  . hydrochlorothiazide (HYDRODIURIL) 25 MG tablet Take 1 tablet (25 mg total) by mouth daily. 90 tablet 3  . levothyroxine (SYNTHROID) 125 MCG tablet TAKE 1 TABLET(125 MCG) BY MOUTH DAILY BEFORE BREAKFAST 90 tablet 3  . pravastatin (PRAVACHOL) 10 MG tablet Take 1-2 tablets (10-20 mg total) by mouth daily. 60 tablet 11   No facility-administered medications prior to visit.    Past Medical History:  Diagnosis Date  . CAD (coronary artery disease)   . Chest pain   . Disorders of bursae and tendons in shoulder region, unspecified    R, tendonitis  . DM  (diabetes mellitus) ([place])    w/o complications  . Dyslipidemia   . GERD (gastroesophageal reflux disease)   . HTN (hypertension)   . Hypercholesterolemia   . Hypothyroidism   . Insomnia   . Serrated adenoma of colon 2009  . Special screening for malignant neoplasm of prostate   . Special screening for malignant neoplasms, colon   . Well adult exam    Past Surgical History:  Procedure Laterality Date  . CARDIAC CATHETERIZATION  2009   shoed no obstructive CAD, medical management   . cornia transplants  2003  . DG ESOPHAGUS -BA SW  2005   mod reflux and hiatal hernia  . HERNIA REPAIR  2002  . THYROIDECTOMY  2004   thyroid nodile   Social History   Social History  . Marital status: Married    Spouse name: N/A  . Number of children: N/A  . Years of education: N/A   Social History Main Topics  . Smoking status: Former Smoker    Quit date: 03/28/2001  . Smokeless tobacco: Never Used     Comment: 40 pack year hx   . Alcohol use No  . Drug use: No  . Sexual activity: Not Asked   Other Topics Concern  . None   Social History Narrative   Married x30 years; 2 daughters 5-23   Occupation: Research scientist (life sciences). Does not get regular exercise.   Diet: fruit and veggies. Occ. Fast food  on weekends.       designated party release form signed authorizing Mardene Celeste C. Guderian (wife) [phone]. Allen Norris 12/12/09.    Family History  Problem Relation Age of Onset  . Heart attack Neg Hx     < age 66  . Colon cancer Neg Hx   . Esophageal cancer Neg Hx   . Rectal cancer Neg Hx   . Stomach cancer Neg Hx        Current Medications, Allergies, Past Medical History, Past Surgical History, Family History and Social History were reviewed in [employer] record.  Physical Exam: General: Well developed, well nourished, no acute distress Head: Normocephalic and atraumatic Eyes:  sclerae anicteric, EOMI Ears: Normal auditory acuity Mouth: No deformity or  lesions Lungs: Clear throughout to auscultation. No chest wall tenderness.  Heart: Regular rate and rhythm; no murmurs, rubs or bruits Abdomen: Soft, non tender and non distended. No masses, hepatosplenomegaly or hernias noted. Normal Bowel sounds Rectal: not done Musculoskeletal: Symmetrical with no gross deformities  Pulses:  Normal pulses noted Extremities: No clubbing, cyanosis, edema or deformities noted Neurological: Alert oriented x 4, grossly nonfocal Psychological:  Alert and cooperative. Normal mood and affect  Assessment and Recommendations:  1. Chest pain, lower sternal area that has not responded to standard treatment for reflux. Etiology unclear. Continued Dexliant qam and standard antireflux measures. Schedule abdominal ultrasound and EGD for further evaluation. Trial of Tylenol tid or Advil tid as needed for chest pain. The risks (including bleeding, perforation, infection, missed lesions, medication reactions and possible hospitalization or surgery if complications occur), benefits, and alternatives to endoscopy with possible biopsy and possible dilation were discussed with the patient and they consent to proceed.   2. Personal history of adenomatous colon polyps. Five-year interval surveillance colonoscopy is recommended in May 2021.

## 2016-06-29 ENCOUNTER — Ambulatory Visit (AMBULATORY_SURGERY_CENTER): Payer: PRIVATE HEALTH INSURANCE | Admitting: Gastroenterology

## 2016-06-29 ENCOUNTER — Encounter: Payer: Self-pay | Admitting: Gastroenterology

## 2016-06-29 VITALS — BP 120/73 | HR 64 | Temp 98.4°F | Resp 14 | Ht 67.0 in | Wt 203.0 lb

## 2016-06-29 DIAGNOSIS — R0789 Other chest pain: Secondary | ICD-10-CM | POA: Diagnosis not present

## 2016-06-29 DIAGNOSIS — K219 Gastro-esophageal reflux disease without esophagitis: Secondary | ICD-10-CM | POA: Diagnosis not present

## 2016-06-29 DIAGNOSIS — K317 Polyp of stomach and duodenum: Secondary | ICD-10-CM

## 2016-06-29 MED ORDER — SODIUM CHLORIDE 0.9 % IV SOLN: 500.0000 mL | INTRAVENOUS | Status: DC

## 2016-06-29 NOTE — Progress Notes (Signed)
Called to room to assist during endoscopic procedure.  Patient ID and intended procedure confirmed with present staff. Received instructions for my participation in the procedure from the performing physician.  

## 2016-06-29 NOTE — Patient Instructions (Signed)
YOU HAD AN ENDOSCOPIC PROCEDURE TODAY AT [facility]:   Refer to the procedure report that was given to you for any specific questions about what was found during the examination.  If the procedure report does not answer your questions, please call your gastroenterologist to clarify.  If you requested that your care partner not be given the details of your procedure findings, then the procedure report has been included in a sealed envelope for you to review at your convenience later.  YOU SHOULD EXPECT: Some feelings of bloating in the abdomen. Passage of more gas than usual.  Walking can help get rid of the air that was put into your GI tract during the procedure and reduce the bloating. If you had a lower endoscopy (such as a colonoscopy or flexible sigmoidoscopy) you may notice spotting of blood in your stool or on the toilet paper. If you underwent a bowel prep for your procedure, you may not have a normal bowel movement for a few days.  Please Note:  You might notice some irritation and congestion in your nose or some drainage.  This is from the oxygen used during your procedure.  There is no need for concern and it should clear up in a day or so.  SYMPTOMS TO REPORT IMMEDIATELY:   Following lower endoscopy (colonoscopy or flexible sigmoidoscopy):  Excessive amounts of blood in the stool  Significant tenderness or worsening of abdominal pains  Swelling of the abdomen that is new, acute  Fever of 100F or higher   Following upper endoscopy (EGD)  Vomiting of blood or coffee ground material  New chest pain or pain under the shoulder blades  Painful or persistently difficult swallowing  New shortness of breath  Fever of 100F or higher  Black, tarry-looking stools  For urgent or emergent issues, a gastroenterologist can be reached at any hour by calling [phone].   DIET:  We do recommend a small meal at first, but then you may proceed to your regular diet.  Drink  plenty of fluids but you should avoid alcoholic beverages for 24 hours.  ACTIVITY:  You should plan to take it easy for the rest of today and you should NOT DRIVE or use heavy machinery until tomorrow (because of the sedation medicines used during the test).    FOLLOW UP: Our staff will call the number listed on your records the next business day following your procedure to check on you and address any questions or concerns that you may have regarding the information given to you following your procedure. If we do not reach you, we will leave a message.  However, if you are feeling well and you are not experiencing any problems, there is no need to return our call.  We will assume that you have returned to your regular daily activities without incident.  If any biopsies were taken you will be contacted by phone or by letter within the next 1-3 weeks.  Please call us at [phone] if you have not heard about the biopsies in 3 weeks.    SIGNATURES/CONFIDENTIALITY: You and/or your care partner have signed paperwork which will be entered into your electronic medical record.  These signatures attest to the fact that that the information above on your After Visit Summary has been reviewed and is understood.  Full responsibility of the confidentiality of this discharge information lies with you and/or your care-partner.  Hiatal hernia information given.

## 2016-06-29 NOTE — Progress Notes (Signed)
Pt's states no medical or surgical changes since previsit or office visit. 

## 2016-06-29 NOTE — Progress Notes (Signed)
Report to PACU, RN, vss, BBS= Clear.  

## 2016-06-29 NOTE — Op Note (Signed)
[place] Patient Name: Brett Lowery Procedure Date: 06/29/2016 2:06 PM MRN: [id number] Endoscopist: Ladene Artist , MD Age: 61 Referring MD:  Date of Birth: November 24, 1955 Gender: Male Account #: [id number] Procedure:                Upper GI endoscopy Indications:              Gastro-esophageal reflux disease, Unexplained chest                            pain Medicines:                Monitored Anesthesia Care Procedure:                Pre-Anesthesia Assessment:                           - Prior to the procedure, a History and Physical                            was performed, and patient medications and                            allergies were reviewed. The patient's tolerance of                            previous anesthesia was also reviewed. The risks                            and benefits of the procedure and the sedation                            options and risks were discussed with the patient.                            All questions were answered, and informed consent                            was obtained. Prior Anticoagulants: The patient has                            taken no previous anticoagulant or antiplatelet                            agents. ASA Grade Assessment: II - A patient with                            mild systemic disease. After reviewing the risks                            and benefits, the patient was deemed in                            satisfactory condition to undergo the procedure.  After obtaining informed consent, the endoscope was                            passed under direct vision. Throughout the                            procedure, the patient's blood pressure, pulse, and                            oxygen saturations were monitored continuously. The                            Model GIF-[id number] scope was introduced                            through the mouth, and advanced to the second part                             of duodenum. The upper GI endoscopy was                            accomplished without difficulty. The patient                            tolerated the procedure well. Scope In: Scope Out: Findings:                 The examined esophagus was normal.                           Multiple 3 to 6 mm sessile polyps with no bleeding                            and no stigmata of recent bleeding were found in                            the gastric body. Biopsies were taken with a cold                            forceps for histology.                           A small hiatal hernia was present.                           The exam of the stomach was otherwise normal.                           The duodenal bulb and second portion of the                            duodenum were normal. Complications:            No immediate complications. Estimated Blood Loss:     Estimated blood loss was minimal. Impression:               -  Normal esophagus.                           - Multiple gastric polyps. Biopsied.                           - Small hiatal hernia.                           - Normal duodenal bulb and second portion of the                            duodenum. Recommendation:           - Patient has a contact number available for                            emergencies. The signs and symptoms of potential                            delayed complications were discussed with the                            patient. Return to normal activities tomorrow.                            Written discharge instructions were provided to the                            patient.                           - Resume previous diet. Follow antireflux measures                            long term.                           - Continue present medications.                           - Await pathology results.                           - No clear GI cause for chest pain was found.                             Follow up with PCP. Ladene Artist, MD 06/29/2016 2:30:55 PM This report has been signed electronically.

## 2016-06-30 ENCOUNTER — Ambulatory Visit (HOSPITAL_COMMUNITY)
Admission: RE | Admit: 2016-06-30 | Discharge: 2016-06-30 | Disposition: A | Discharge status: Home / Self Care | Payer: PRIVATE HEALTH INSURANCE | Source: Ambulatory Visit | Attending: Gastroenterology | Admitting: Gastroenterology

## 2016-06-30 ENCOUNTER — Telehealth: Payer: Self-pay | Admitting: *Deleted

## 2016-06-30 DIAGNOSIS — N289 Disorder of kidney and ureter, unspecified: Secondary | ICD-10-CM | POA: Diagnosis not present

## 2016-06-30 DIAGNOSIS — K219 Gastro-esophageal reflux disease without esophagitis: Secondary | ICD-10-CM | POA: Diagnosis not present

## 2016-06-30 DIAGNOSIS — R0789 Other chest pain: Secondary | ICD-10-CM | POA: Diagnosis not present

## 2016-06-30 NOTE — Telephone Encounter (Signed)
  Follow up Call-  Call back number 06/29/2016 07/15/2014  Post procedure Call Back phone  # [phone]  Permission to leave phone message Yes Yes  Some recent data might be hidden     Patient questions:  Do you have a fever, pain , or abdominal swelling? No. Pain Score  0 *  Have you tolerated food without any problems? Yes.    Have you been able to return to your normal activities? Yes.    Do you have any questions about your discharge instructions: Diet   No. Medications  No. Follow up visit  No.  Do you have questions or concerns about your Care? No.  Actions: * If pain score is 4 or above: No action needed, pain <4.

## 2016-07-06 ENCOUNTER — Other Ambulatory Visit: Payer: Self-pay | Admitting: Urology

## 2016-07-06 ENCOUNTER — Encounter: Payer: Self-pay | Admitting: Gastroenterology

## 2016-07-06 DIAGNOSIS — D49511 Neoplasm of unspecified behavior of right kidney: Secondary | ICD-10-CM

## 2016-07-07 ENCOUNTER — Emergency Department (HOSPITAL_COMMUNITY)
Admission: EM | Admit: 2016-07-07 | Discharge: 2016-07-07 | Disposition: A | Discharge status: Home / Self Care | Payer: PRIVATE HEALTH INSURANCE | Attending: Emergency Medicine | Admitting: Emergency Medicine

## 2016-07-07 ENCOUNTER — Other Ambulatory Visit: Payer: Self-pay

## 2016-07-07 ENCOUNTER — Emergency Department (HOSPITAL_COMMUNITY): Payer: PRIVATE HEALTH INSURANCE

## 2016-07-07 ENCOUNTER — Encounter (HOSPITAL_COMMUNITY): Payer: Self-pay | Admitting: *Deleted

## 2016-07-07 DIAGNOSIS — E039 Hypothyroidism, unspecified: Secondary | ICD-10-CM | POA: Insufficient documentation

## 2016-07-07 DIAGNOSIS — I2510 Atherosclerotic heart disease of native coronary artery without angina pectoris: Secondary | ICD-10-CM | POA: Insufficient documentation

## 2016-07-07 DIAGNOSIS — Z7982 Long term (current) use of aspirin: Secondary | ICD-10-CM | POA: Insufficient documentation

## 2016-07-07 DIAGNOSIS — Z79899 Other long term (current) drug therapy: Secondary | ICD-10-CM | POA: Diagnosis not present

## 2016-07-07 DIAGNOSIS — T7840XA Allergy, unspecified, initial encounter: Secondary | ICD-10-CM | POA: Diagnosis not present

## 2016-07-07 DIAGNOSIS — E119 Type 2 diabetes mellitus without complications: Secondary | ICD-10-CM | POA: Diagnosis not present

## 2016-07-07 DIAGNOSIS — I10 Essential (primary) hypertension: Secondary | ICD-10-CM | POA: Diagnosis not present

## 2016-07-07 DIAGNOSIS — Z87891 Personal history of nicotine dependence: Secondary | ICD-10-CM | POA: Insufficient documentation

## 2016-07-07 LAB — CBC WITH DIFFERENTIAL/PLATELET
Basophils Absolute: 0 10*3/uL (ref 0.0–0.1)
Basophils Relative: 0 %
EOS PCT: 1 %
Eosinophils Absolute: 0.1 10*3/uL (ref 0.0–0.7)
HCT: 42.2 % (ref 39.0–52.0)
HEMOGLOBIN: 14.4 g/dL (ref 13.0–17.0)
LYMPHS ABS: 1.7 10*3/uL (ref 0.7–4.0)
LYMPHS PCT: 14 %
MCH: 28.8 pg (ref 26.0–34.0)
MCHC: 34.1 g/dL (ref 30.0–36.0)
MCV: 84.4 fL (ref 78.0–100.0)
MONOS PCT: 6 %
Monocytes Absolute: 0.7 10*3/uL (ref 0.1–1.0)
NEUTROS PCT: 79 %
Neutro Abs: 9.2 10*3/uL — ABNORMAL HIGH (ref 1.7–7.7)
Platelets: 250 10*3/uL (ref 150–400)
RBC: 5 MIL/uL (ref 4.22–5.81)
RDW: 13.3 % (ref 11.5–15.5)
WBC: 11.7 10*3/uL — ABNORMAL HIGH (ref 4.0–10.5)

## 2016-07-07 LAB — BASIC METABOLIC PANEL
Anion gap: 10 (ref 5–15)
BUN: 23 mg/dL — AB (ref 6–20)
CHLORIDE: 106 mmol/L (ref 101–111)
CO2: 26 mmol/L (ref 22–32)
Calcium: 9 mg/dL (ref 8.9–10.3)
Creatinine, Ser: 1.32 mg/dL — ABNORMAL HIGH (ref 0.61–1.24)
GFR calc Af Amer: >60 mL/min (ref >60)
GFR calc non Af Amer: 57 mL/min — ABNORMAL LOW (ref >60)
Glucose, Bld: 133 mg/dL — ABNORMAL HIGH (ref 65–99)
POTASSIUM: 3 mmol/L — AB (ref 3.5–5.1)
SODIUM: 142 mmol/L (ref 135–145)

## 2016-07-07 LAB — TROPONIN I: Troponin I: <0.03 ng/mL (ref <0.03)

## 2016-07-07 NOTE — ED Triage Notes (Signed)
Pt states he woke up about an hour ago and was having some chest tightness and he began to break out in hives all over his body; pt states the hives have gone but he is still experiencing some chest pressure

## 2016-07-07 NOTE — ED Provider Notes (Signed)
[facility] DEPT Provider Note   CSN: [id number] Arrival date & time: 07/07/16  0147     History   Chief Complaint Chief Complaint  Patient presents with  . Chest Pain    HPI Brett Lowery is a 61 y.o. male.  HPI   Patient presents with allergic reaction.  He reports he went to bed feeling well He woke up in the night covered in hives and itching Wife confirms that he had hives No sob No angioedema No abd pain It is now resolved Unclear what caused this, he has not started any new meds or new exposures.   He mentioned mild chest pressure but similar to prior episodes or reflux and is not unusual for him   Past Medical History:  Diagnosis Date  . CAD (coronary artery disease)   . Chest pain   . Disorders of bursae and tendons in shoulder region, unspecified    R, tendonitis  . DM (diabetes mellitus) ([place])    w/o complications  . Dyslipidemia   . GERD (gastroesophageal reflux disease)   . HTN (hypertension)   . Hypercholesterolemia   . Hypothyroidism   . Insomnia   . Serrated adenoma of colon 2009  . Special screening for malignant neoplasm of prostate   . Special screening for malignant neoplasms, colon   . Well adult exam     Patient Active Problem List   Diagnosis Date Noted  . Fatigue 11/25/2015  . Low back pain 05/23/2013  . Chronic chest pain 02/27/2013  . DOE (dyspnea on exertion) 02/27/2013  . DDD (degenerative disc disease), cervical 01/01/2013  . Chronic neck pain 09/28/2012  . Tinnitus of both ears 09/28/2012  . Insomnia 09/17/2011  . Pulmonary nodule seen on imaging study 09/17/2011  . BENIGN PROSTATIC HYPERTROPHY, HX OF 12/17/2009  . CHRONIC RHINITIS 12/18/2008  . Diabetes mellitus with no complication ([place]) 58/85/0277  . Coronary atherosclerosis 04/25/2007  . Hypothyroidism 06/15/2006  . HYPERCHOLESTEROLEMIA 06/15/2006  . Essential hypertension, benign 06/15/2006  . GERD 06/15/2006    Past Surgical History:  Procedure Laterality  Date  . CARDIAC CATHETERIZATION  2009   shoed no obstructive CAD, medical management   . cornia transplants  2003  . DG ESOPHAGUS -BA SW  2005   mod reflux and hiatal hernia  . HERNIA REPAIR  2002  . THYROIDECTOMY  2004   thyroid nodile       Home Medications    Prior to Admission medications   Medication Sig Start Date End Date Taking? Authorizing Provider  amLODipine (NORVASC) 5 MG tablet Take 1 tablet (5 mg total) by mouth daily. 06/01/16   Bedsole, Amy E, MD  aspirin 81 MG tablet Take 81 mg by mouth daily.      [provider]  dexlansoprazole (DEXILANT) 60 MG capsule Take 1 capsule (60 mg total) by mouth daily. 06/01/16   Bedsole, Amy E, MD  fluticasone (FLONASE) 50 MCG/ACT nasal spray SHAKE LIQUID AND USE 2 SPRAYS IN EACH NOSTRIL EVERY DAY 06/21/16   Bedsole, Amy E, MD  hydrochlorothiazide (HYDRODIURIL) 25 MG tablet Take 1 tablet (25 mg total) by mouth daily. 06/01/16   Bedsole, Amy E, MD  levothyroxine (SYNTHROID) 125 MCG tablet TAKE 1 TABLET(125 MCG) BY MOUTH DAILY BEFORE BREAKFAST 06/01/16   Bedsole, Amy E, MD  pravastatin (PRAVACHOL) 10 MG tablet Take 1-2 tablets (10-20 mg total) by mouth daily. 06/01/16   [name], MD    Family History Family History  Problem Relation Age of  Onset  . Heart attack Neg Hx        < age 19  . Colon cancer Neg Hx   . Esophageal cancer Neg Hx   . Rectal cancer Neg Hx   . Stomach cancer Neg Hx     Social History Social History  Substance Use Topics  . Smoking status: Former Smoker    Quit date: 03/28/2001  . Smokeless tobacco: Never Used     Comment: 40 pack year hx   . Alcohol use No     Allergies   Patient has no known allergies.   Review of Systems Review of Systems  HENT: Negative for trouble swallowing.   Respiratory: Negative for shortness of breath.   Gastrointestinal: Negative for diarrhea and vomiting.  Skin: Positive for rash.  All other systems reviewed and are negative.    Physical Exam Updated  Vital Signs BP 129/87   Pulse 71   Temp 98.2 F (36.8 C) (Oral)   Resp 19   Ht 5\' 8"  (1.727 m)   Wt 92.1 kg   SpO2 97%   BMI 30.87 kg/m   Physical Exam  CONSTITUTIONAL: Well developed/well nourished HEAD: Normocephalic/atraumatic EYES: EOMI/PERRL ENMT: Mucous membranes moist, no angioedema NECK: supple no meningeal signs SPINE/BACK:entire spine nontender CV: S1/S2 noted, no murmurs/rubs/gallops noted LUNGS: Lungs are clear to auscultation bilaterally, no apparent distress ABDOMEN: soft, nontender, no rebound or guarding, bowel sounds noted throughout abdomen GU:no cva tenderness NEURO: Pt is awake/alert/appropriate, moves all extremitiesx4.  No facial droop.   EXTREMITIES: pulses normal/equal, full ROM SKIN: warm, color normal, no rash, no hives PSYCH: no abnormalities of mood noted, alert and oriented to situation  ED Treatments / Results  Labs (all labs ordered are listed, but only abnormal results are displayed) Labs Reviewed  BASIC METABOLIC PANEL - Abnormal; Notable for the following:       Result Value   Potassium 3.0 (*)    Glucose, Bld 133 (*)    BUN 23 (*)    Creatinine, Ser 1.32 (*)    GFR calc non Af Amer 57 (*)    All other components within normal limits  CBC WITH DIFFERENTIAL/PLATELET - Abnormal; Notable for the following:    WBC 11.7 (*)    Neutro Abs 9.2 (*)    All other components within normal limits  TROPONIN I    EKG ED ECG REPORT   Date: 07/07/2016 2:24am  Rate: 64  Rhythm: normal sinus rhythm  QRS Axis: left  Intervals: normal  ST/T Wave abnormalities: nonspecific ST changes  Conduction Disutrbances:none  Narrative Interpretation:   I have personally reviewed the EKG tracing and agree with the computerized printout as noted.  Radiology Dg Chest 2 View  Result Date: 07/07/2016 CLINICAL DATA:  Mid chest pain.  Shortness of breath. EXAM: CHEST  2 VIEW COMPARISON:  Chest CT 04/06/2013 FINDINGS: Heart size is normal, there is  tortuosity of the thoracic aorta. Surgical clips at the thoracic inlet. The lungs are clear. Pulmonary vasculature is normal. No consolidation, pleural effusion, or pneumothorax. Moderate thoracic scoliosis. No acute osseous abnormalities are seen. IMPRESSION: No acute abnormality. Thoracic scoliosis and aortic tortuosity, stable from prior CT. Electronically Signed   By: Jeb Levering M.D.   On: 07/07/2016 03:55    Procedures Procedures (including critical care time)  Medications Ordered in ED Medications - No data to display   Initial Impression / Assessment and Plan / ED Course  I have reviewed the triage vital signs and  the nursing notes.  Pertinent labs & imaging results that were available during my care of the patient were reviewed by me and considered in my medical decision making (see chart for details).     Pt well appearing He was most concerned about allergic rxn, but it has resolved by the time of my evauation He reports CP is similar to prior reflux and he is not concerned for this  Will d/c home We discussed return precautions   Final Clinical Impressions(s) / ED Diagnoses   Final diagnoses:  Allergic reaction, initial encounter    New Prescriptions New Prescriptions   No medications on file     Ripley Fraise, MD 07/07/16 [phone]

## 2016-07-27 ENCOUNTER — Ambulatory Visit
Admission: RE | Admit: 2016-07-27 | Discharge: 2016-07-27 | Disposition: A | Discharge status: Home / Self Care | Payer: PRIVATE HEALTH INSURANCE | Source: Ambulatory Visit | Attending: Urology | Admitting: Urology

## 2016-07-27 DIAGNOSIS — D49511 Neoplasm of unspecified behavior of right kidney: Secondary | ICD-10-CM

## 2016-07-27 MED ORDER — GADOBENATE DIMEGLUMINE 529 MG/ML IV SOLN
20.0000 mL | Freq: Once | INTRAVENOUS | Status: AC | PRN
  Administered 2016-07-27: 20 mL via INTRAVENOUS

## 2016-12-01 ENCOUNTER — Telehealth: Payer: Self-pay | Admitting: Family Medicine

## 2016-12-01 DIAGNOSIS — E7800 Pure hypercholesterolemia, unspecified: Secondary | ICD-10-CM

## 2016-12-01 DIAGNOSIS — E890 Postprocedural hypothyroidism: Secondary | ICD-10-CM

## 2016-12-01 DIAGNOSIS — E119 Type 2 diabetes mellitus without complications: Secondary | ICD-10-CM

## 2016-12-01 NOTE — Telephone Encounter (Signed)
-----   Message from Ellamae Sia sent at 11/24/2016 11:28 AM EDT ----- Regarding: Lab orders for Thursday, 10.11.18 Lab orders for a 3 month follow up appt.

## 2016-12-02 ENCOUNTER — Other Ambulatory Visit (INDEPENDENT_AMBULATORY_CARE_PROVIDER_SITE_OTHER): Payer: PRIVATE HEALTH INSURANCE

## 2016-12-02 DIAGNOSIS — E119 Type 2 diabetes mellitus without complications: Secondary | ICD-10-CM

## 2016-12-02 DIAGNOSIS — E7800 Pure hypercholesterolemia, unspecified: Secondary | ICD-10-CM

## 2016-12-02 LAB — LIPID PANEL
CHOL/HDL RATIO: 4
CHOLESTEROL: 168 mg/dL (ref 0–200)
HDL: 42.3 mg/dL (ref >39.00)
LDL Cholesterol: 107 mg/dL — ABNORMAL HIGH (ref 0–99)
NonHDL: 125.53
TRIGLYCERIDES: 93 mg/dL (ref 0.0–149.0)
VLDL: 18.6 mg/dL (ref 0.0–40.0)

## 2016-12-02 LAB — COMPREHENSIVE METABOLIC PANEL
ALBUMIN: 4.3 g/dL (ref 3.5–5.2)
ALT: 17 U/L (ref 0–53)
AST: 16 U/L (ref 0–37)
Alkaline Phosphatase: 57 U/L (ref 39–117)
BILIRUBIN TOTAL: 0.8 mg/dL (ref 0.2–1.2)
BUN: 17 mg/dL (ref 6–23)
CALCIUM: 9.3 mg/dL (ref 8.4–10.5)
CO2: 33 meq/L — AB (ref 19–32)
Chloride: 101 mEq/L (ref 96–112)
Creatinine, Ser: 1.05 mg/dL (ref 0.40–1.50)
GFR: 76.61 mL/min (ref >60.00)
Glucose, Bld: 108 mg/dL — ABNORMAL HIGH (ref 70–99)
Potassium: 3.5 mEq/L (ref 3.5–5.1)
Sodium: 140 mEq/L (ref 135–145)
Total Protein: 6.8 g/dL (ref 6.0–8.3)

## 2016-12-02 LAB — HEMOGLOBIN A1C: Hgb A1c MFr Bld: 6.2 % (ref 4.6–6.5)

## 2016-12-07 ENCOUNTER — Encounter: Payer: Self-pay | Admitting: Family Medicine

## 2016-12-07 ENCOUNTER — Ambulatory Visit (INDEPENDENT_AMBULATORY_CARE_PROVIDER_SITE_OTHER): Payer: PRIVATE HEALTH INSURANCE | Admitting: Family Medicine

## 2016-12-07 VITALS — BP 110/70 | HR 76 | Temp 97.8°F | Ht 67.0 in | Wt 178.2 lb

## 2016-12-07 DIAGNOSIS — I10 Essential (primary) hypertension: Secondary | ICD-10-CM | POA: Diagnosis not present

## 2016-12-07 DIAGNOSIS — E7800 Pure hypercholesterolemia, unspecified: Secondary | ICD-10-CM

## 2016-12-07 DIAGNOSIS — Z23 Encounter for immunization: Secondary | ICD-10-CM | POA: Diagnosis not present

## 2016-12-07 DIAGNOSIS — E119 Type 2 diabetes mellitus without complications: Secondary | ICD-10-CM | POA: Diagnosis not present

## 2016-12-07 LAB — HM DIABETES FOOT EXAM

## 2016-12-07 NOTE — Patient Instructions (Signed)
Keep up the great work on lifestyle change!  If dizziness or BP < 90/60.. Call for reduction in BP meds.

## 2016-12-07 NOTE — Assessment & Plan Note (Signed)
Significant improved control with diet and weight loss.Pt congratulated and maintenance discussed.

## 2016-12-07 NOTE — Assessment & Plan Note (Signed)
Well controlled. Continue current medication.  

## 2016-12-07 NOTE — Progress Notes (Signed)
   Subjective:    Patient ID: Brett Lowery, male    DOB: [birth year], 61 y.o.   MRN: [id number]  HPI    61 year old male presents for follow up DM  Diabetes:   Good control with diet, he has made dramatic reduction in sweets and cookies, snacks. Lab Results  Component Value Date   HGBA1C 6.2 12/02/2016  Using medications without difficulties: Hypoglycemic episodes: Hyperglycemic episodes: Feet problems: no ulcers Blood Sugars averaging:  Not checking eye exam within last year: has schedule.   He has lost 25 lbs in last 6 months! Wt Readings from Last 3 Encounters:  12/07/16 178 lb 4 oz (80.9 kg)  07/07/16 203 lb (92.1 kg)  06/29/16 203 lb (92.1 kg)  Body mass index is 27.92 kg/m.  Hypertension:    Good control on amlodipine and HCTZ. BP Readings from Last 3 Encounters:  12/07/16 110/70  07/07/16 129/87  06/29/16 120/73  Using medication without problems or lightheadedness:  Chest pain with exertion:none Edema: none Short of breath: stable Average home BPs: Other issues:  Elevated Cholesterol:  Almost at goal < 100 on low dose  Pravastatin.. Now increased in last month to 20 mg daily. Lab Results  Component Value Date   CHOL 168 12/02/2016   HDL 42.30 12/02/2016   LDLCALC 107 (H) 12/02/2016   TRIG 93.0 12/02/2016   CHOLHDL 4 12/02/2016  Using medications without problems: Muscle aches:  Diet compliance: Improved.. Less sweets Exercise: walking daily Other complaints:   Blood pressure 110/70, pulse 76, temperature 97.8 F (36.6 C), temperature source Oral, height 5\' 7"  (1.702 m), weight 178 lb 4 oz (80.9 kg).   Review of Systems  Constitutional: Negative for fatigue and fever.  HENT: Negative for ear pain.   Eyes: Negative for pain.  Respiratory: Positive for shortness of breath. Negative for cough.        Stable chronic SOB and non cardiac CP.  Cardiovascular: Positive for chest pain. Negative for palpitations and leg swelling.  Gastrointestinal:  Negative for abdominal pain.  Genitourinary: Negative for dysuria.  Musculoskeletal: Negative for arthralgias.  Neurological: Negative for syncope, light-headedness and headaches.  Psychiatric/Behavioral: Negative for dysphoric mood.       Objective:   Physical Exam  Constitutional: Vital signs are normal. He appears well-developed and well-nourished.  HENT:  Head: Normocephalic.  Right Ear: Hearing normal.  Left Ear: Hearing normal.  Nose: Nose normal.  Mouth/Throat: Oropharynx is clear and moist and mucous membranes are normal.  Neck: Trachea normal. Carotid bruit is not present. No thyroid mass and no thyromegaly present.  Cardiovascular: Normal rate, regular rhythm and normal pulses.  Exam reveals no gallop, no distant heart sounds and no friction rub.   No murmur heard. No peripheral edema  Pulmonary/Chest: Effort normal and breath sounds normal. No respiratory distress.  Skin: Skin is warm, dry and intact. No rash noted.  Psychiatric: He has a normal mood and affect. His speech is normal and behavior is normal. Thought content normal.   Diabetic foot exam: Normal inspection No skin breakdown No calluses  Normal DP pulses Normal sensation to light touch and monofilament Nails normal       Assessment & Plan:

## 2016-12-07 NOTE — Assessment & Plan Note (Signed)
Almost at goal.. Pt now has increase to 20 mg pravastatin daily without SE.Marland Kitchen Re-eval in 6 months.  Stable LFTs.

## 2017-01-10 ENCOUNTER — Encounter (HOSPITAL_BASED_OUTPATIENT_CLINIC_OR_DEPARTMENT_OTHER): Payer: Self-pay | Admitting: *Deleted

## 2017-01-11 NOTE — H&P (Signed)
[facility] [address] [place], [place] [address] [phone] A Division of [facility]  Ninetta Lights, M.D.   Robert A. Noemi Chapel, M.D.   Faythe Casa, M.D.   Johnny Bridge, M.D.   Almedia Balls, M.D. Ernesta Amble. Percell Miller, M.D.  Joseph Pierini, M.D. Lanier Prude, M.D.  Verner Chol, M.D.  Ophelia Charter, M.D. Lemar Lofty, Guy. Venida Jarvis, PA-C  Kirstin A. Shepperson, PA-C Josh Republic, PA-C  [place], [place]  RE:  Brett Lowery, Brett Lowery        [id number]         DOB:  12/03/1955 PROGRESS NOTE: 01-03-2017  Reason for visit:  Follow up left shoulder rotator cuff repair done on July 26.   History of present illness:  Strength has done well, however, he has hit a wall in terms of motion.  He is only getting up to about 90 degrees.  His pain has been well controlled.  EXAMINATION: A well-appearing male in no apparent distress.   Active and passive motion are both roughly at 90 degrees.  Forward elevation, he is at 140 on the other shoulder.  Abduction, he has about 50 degrees and has about 80 on the other shoulder.  IMAGES: None today.  ASSESSMENT & PLAN: We had a talk about his options at this point.  He is about three and a half months out.  I recommend manipulation under anesthesia and possible arthroscopic debridement, but I think we will probably just do an isolated manipulation.   Ernesta Amble.  Percell Miller, M.D.   Electronically verified by Ernesta Amble. Percell Miller, M.D. TDM:rc D 01-03-2017 T 01-05-2017

## 2017-01-18 ENCOUNTER — Encounter (HOSPITAL_BASED_OUTPATIENT_CLINIC_OR_DEPARTMENT_OTHER): Payer: Self-pay

## 2017-01-18 ENCOUNTER — Other Ambulatory Visit: Payer: Self-pay

## 2017-01-18 ENCOUNTER — Ambulatory Visit (HOSPITAL_BASED_OUTPATIENT_CLINIC_OR_DEPARTMENT_OTHER)
Admission: RE | Admit: 2017-01-18 | Discharge: 2017-01-18 | Disposition: A | Discharge status: Home / Self Care | Payer: Worker's Compensation | Source: Ambulatory Visit | Attending: Orthopedic Surgery | Admitting: Orthopedic Surgery

## 2017-01-18 ENCOUNTER — Ambulatory Visit (HOSPITAL_BASED_OUTPATIENT_CLINIC_OR_DEPARTMENT_OTHER): Payer: Worker's Compensation | Admitting: Anesthesiology

## 2017-01-18 ENCOUNTER — Encounter (HOSPITAL_BASED_OUTPATIENT_CLINIC_OR_DEPARTMENT_OTHER)
Admission: RE | Disposition: A | Discharge status: Home / Self Care | Payer: Self-pay | Source: Ambulatory Visit | Attending: Orthopedic Surgery

## 2017-01-18 DIAGNOSIS — K219 Gastro-esophageal reflux disease without esophagitis: Secondary | ICD-10-CM | POA: Diagnosis not present

## 2017-01-18 DIAGNOSIS — I10 Essential (primary) hypertension: Secondary | ICD-10-CM | POA: Diagnosis not present

## 2017-01-18 DIAGNOSIS — I2510 Atherosclerotic heart disease of native coronary artery without angina pectoris: Secondary | ICD-10-CM | POA: Insufficient documentation

## 2017-01-18 DIAGNOSIS — M7502 Adhesive capsulitis of left shoulder: Secondary | ICD-10-CM | POA: Insufficient documentation

## 2017-01-18 DIAGNOSIS — M24612 Ankylosis, left shoulder: Secondary | ICD-10-CM

## 2017-01-18 DIAGNOSIS — Z87891 Personal history of nicotine dependence: Secondary | ICD-10-CM | POA: Insufficient documentation

## 2017-01-18 HISTORY — PX: CLOSED MANIPULATION SHOULDER WITH STERIOD INJECTION: SHX5611

## 2017-01-18 LAB — GLUCOSE, CAPILLARY
GLUCOSE-CAPILLARY: 107 mg/dL — AB (ref 65–99)
Glucose-Capillary: 85 mg/dL (ref 65–99)

## 2017-01-18 SURGERY — CLOSED MANIPULATION SHOULDER WITH STEROID INJECTION
Anesthesia: Monitor Anesthesia Care | Site: Shoulder | Laterality: Left

## 2017-01-18 MED ORDER — PROPOFOL 10 MG/ML IV BOLUS
INTRAVENOUS | Status: AC
  Filled 2017-01-18: qty 40

## 2017-01-18 MED ORDER — FENTANYL CITRATE (PF) 100 MCG/2ML IJ SOLN
100.0000 ug | Freq: Once | INTRAMUSCULAR | Status: AC
  Administered 2017-01-18: 100 ug via INTRAVENOUS

## 2017-01-18 MED ORDER — ONDANSETRON HCL 4 MG/2ML IJ SOLN
INTRAMUSCULAR | Status: AC
  Filled 2017-01-18: qty 2

## 2017-01-18 MED ORDER — CHLORHEXIDINE GLUCONATE 4 % EX LIQD: 60.0000 mL | Freq: Once | CUTANEOUS | Status: DC

## 2017-01-18 MED ORDER — SUCCINYLCHOLINE CHLORIDE 200 MG/10ML IV SOSY
PREFILLED_SYRINGE | INTRAVENOUS | Status: AC
  Filled 2017-01-18: qty 10

## 2017-01-18 MED ORDER — LACTATED RINGERS IV SOLN: INTRAVENOUS | Status: DC

## 2017-01-18 MED ORDER — ARTIFICIAL TEARS OPHTHALMIC OINT
TOPICAL_OINTMENT | OPHTHALMIC | Status: AC
  Filled 2017-01-18: qty 3.5

## 2017-01-18 MED ORDER — DEXAMETHASONE SODIUM PHOSPHATE 10 MG/ML IJ SOLN
INTRAMUSCULAR | Status: AC
  Filled 2017-01-18: qty 1

## 2017-01-18 MED ORDER — LIDOCAINE 2% (20 MG/ML) 5 ML SYRINGE
INTRAMUSCULAR | Status: AC
  Filled 2017-01-18: qty 10

## 2017-01-18 MED ORDER — ONDANSETRON HCL 4 MG/2ML IJ SOLN
INTRAMUSCULAR | Status: DC | PRN
  Administered 2017-01-18: 4 mg via INTRAVENOUS

## 2017-01-18 MED ORDER — FENTANYL CITRATE (PF) 100 MCG/2ML IJ SOLN
INTRAMUSCULAR | Status: AC
  Filled 2017-01-18: qty 2

## 2017-01-18 MED ORDER — METHYLPREDNISOLONE ACETATE 80 MG/ML IJ SUSP
INTRAMUSCULAR | Status: DC | PRN
  Administered 2017-01-18: 80 mg

## 2017-01-18 MED ORDER — SCOPOLAMINE 1 MG/3DAYS TD PT72
MEDICATED_PATCH | TRANSDERMAL | Status: AC
  Filled 2017-01-18: qty 1

## 2017-01-18 MED ORDER — METHYLPREDNISOLONE ACETATE 80 MG/ML IJ SUSP
INTRAMUSCULAR | Status: AC
  Filled 2017-01-18: qty 1

## 2017-01-18 MED ORDER — FENTANYL CITRATE (PF) 100 MCG/2ML IJ SOLN: 25.0000 ug | INTRAMUSCULAR | Status: DC | PRN

## 2017-01-18 MED ORDER — ACETAMINOPHEN 500 MG PO TABS: 1000.0000 mg | ORAL_TABLET | Freq: Once | ORAL | Status: DC

## 2017-01-18 MED ORDER — FENTANYL CITRATE (PF) 100 MCG/2ML IJ SOLN: 50.0000 ug | INTRAMUSCULAR | Status: DC | PRN

## 2017-01-18 MED ORDER — PROPOFOL 10 MG/ML IV BOLUS
INTRAVENOUS | Status: DC | PRN
Start: 2017-01-18 — End: 2017-01-18
  Administered 2017-01-18: 50 mg via INTRAVENOUS

## 2017-01-18 MED ORDER — BUPIVACAINE HCL (PF) 0.25 % IJ SOLN
INTRAMUSCULAR | Status: AC
  Filled 2017-01-18: qty 30

## 2017-01-18 MED ORDER — MIDAZOLAM HCL 2 MG/2ML IJ SOLN
INTRAMUSCULAR | Status: AC
  Filled 2017-01-18: qty 2

## 2017-01-18 MED ORDER — SCOPOLAMINE 1 MG/3DAYS TD PT72
1.0000 | MEDICATED_PATCH | Freq: Once | TRANSDERMAL | Status: DC | PRN
  Administered 2017-01-18: 1.5 mg via TRANSDERMAL

## 2017-01-18 MED ORDER — BUPIVACAINE HCL (PF) 0.25 % IJ SOLN
INTRAMUSCULAR | Status: DC | PRN
  Administered 2017-01-18: 3 mL

## 2017-01-18 MED ORDER — CEFAZOLIN SODIUM-DEXTROSE 2-4 GM/100ML-% IV SOLN
INTRAVENOUS | Status: AC
  Filled 2017-01-18: qty 100

## 2017-01-18 MED ORDER — CEFAZOLIN SODIUM-DEXTROSE 2-4 GM/100ML-% IV SOLN: 2.0000 g | INTRAVENOUS | Status: DC

## 2017-01-18 MED ORDER — LACTATED RINGERS IV SOLN
INTRAVENOUS | Status: DC
  Administered 2017-01-18: 08:00:00 via INTRAVENOUS

## 2017-01-18 MED ORDER — FENTANYL CITRATE (PF) 100 MCG/2ML IJ SOLN
INTRAMUSCULAR | Status: DC | PRN
  Administered 2017-01-18: 50 ug via INTRAVENOUS

## 2017-01-18 MED ORDER — MIDAZOLAM HCL 2 MG/2ML IJ SOLN
1.0000 mg | INTRAMUSCULAR | Status: DC | PRN
  Administered 2017-01-18 (×2): 2 mg via INTRAVENOUS

## 2017-01-18 MED ORDER — BUPIVACAINE-EPINEPHRINE (PF) 0.5% -1:200000 IJ SOLN
INTRAMUSCULAR | Status: DC | PRN
  Administered 2017-01-18: 20 mL
  Administered 2017-01-18: 10 mL via PERINEURAL

## 2017-01-18 SURGICAL SUPPLY — 73 items
AID PSTN UNV HD RSTRNT DISP (MISCELLANEOUS)
BLADE CUTTER GATOR 3.5 (BLADE) IMPLANT
BLADE CUTTER MENIS 5.5 (BLADE) IMPLANT
BLADE GREAT WHITE 4.2 (BLADE) IMPLANT
BUR OVAL 4.0 (BURR) ×1 IMPLANT
BUR OVAL 6.0 (BURR) IMPLANT
CANNULA 5.75X71 LONG (CANNULA) IMPLANT
CANNULA DRY DOC 8X75 (CANNULA) IMPLANT
CANNULA TWIST IN 8.25X7CM (CANNULA) IMPLANT
CHLORAPREP W/TINT 26ML (MISCELLANEOUS) ×1 IMPLANT
CLSR STERI-STRIP ANTIMIC 1/2X4 (GAUZE/BANDAGES/DRESSINGS) IMPLANT
DECANTER SPIKE VIAL GLASS SM (MISCELLANEOUS) IMPLANT
DRAPE IMP U-DRAPE 54X76 (DRAPES) ×1 IMPLANT
DRAPE INCISE IOBAN 66X45 STRL (DRAPES) ×1 IMPLANT
DRAPE SHOULDER BEACH CHAIR (DRAPES) ×2 IMPLANT
DRAPE STERI 35X30 U-POUCH (DRAPES) ×1 IMPLANT
DRAPE U-SHAPE 47X51 STRL (DRAPES) ×1 IMPLANT
DRSG PAD ABDOMINAL 8X10 ST (GAUZE/BANDAGES/DRESSINGS) ×1 IMPLANT
ELECT NDL TIP 2.8 STRL (NEEDLE) IMPLANT
ELECT NEEDLE TIP 2.8 STRL (NEEDLE) IMPLANT
ELECT REM PT RETURN 9FT ADLT (ELECTROSURGICAL)
ELECTRODE REM PT RTRN 9FT ADLT (ELECTROSURGICAL) ×1 IMPLANT
GAUZE SPONGE 4X4 12PLY STRL (GAUZE/BANDAGES/DRESSINGS) ×2 IMPLANT
GAUZE XEROFORM 1X8 LF (GAUZE/BANDAGES/DRESSINGS) ×3 IMPLANT
GLOVE BIO SURGEON STRL SZ7.5 (GLOVE) ×2 IMPLANT
GLOVE BIOGEL PI IND STRL 8 (GLOVE) ×2 IMPLANT
GLOVE BIOGEL PI INDICATOR 8 (GLOVE)
GOWN STRL REUS W/ TWL LRG LVL3 (GOWN DISPOSABLE) ×3 IMPLANT
GOWN STRL REUS W/ TWL XL LVL3 (GOWN DISPOSABLE) ×1 IMPLANT
GOWN STRL REUS W/TWL LRG LVL3 (GOWN DISPOSABLE)
GOWN STRL REUS W/TWL XL LVL3 (GOWN DISPOSABLE)
MANIFOLD NEPTUNE II (INSTRUMENTS) ×1 IMPLANT
NDL HYPO 25X1 1.5 SAFETY (NEEDLE) IMPLANT
NDL SCORPION MULTI FIRE (NEEDLE) IMPLANT
NDL SUT 6 .5 CRC .975X.05 MAYO (NEEDLE) IMPLANT
NEEDLE HYPO 25X1 1.5 SAFETY (NEEDLE) ×3 IMPLANT
NEEDLE MAYO TAPER (NEEDLE)
NEEDLE SCORPION MULTI FIRE (NEEDLE) IMPLANT
NS IRRIG 1000ML POUR BTL (IV SOLUTION) IMPLANT
PACK ARTHROSCOPY DSU (CUSTOM PROCEDURE TRAY) ×1 IMPLANT
PACK BASIN DAY SURGERY FS (CUSTOM PROCEDURE TRAY) ×1 IMPLANT
PAD ALCOHOL SWAB (MISCELLANEOUS) ×2 IMPLANT
PENCIL BUTTON HOLSTER BLD 10FT (ELECTRODE) ×1 IMPLANT
PROBE BIPOLAR ATHRO 135MM 90D (MISCELLANEOUS) ×1 IMPLANT
RESTRAINT HEAD UNIVERSAL NS (MISCELLANEOUS) ×1 IMPLANT
SET ARTHROSCOPY TUBING (MISCELLANEOUS)
SET ARTHROSCOPY TUBING LN (MISCELLANEOUS) ×1 IMPLANT
SLEEVE SCD COMPRESS KNEE MED (MISCELLANEOUS) IMPLANT
SLING ARM FOAM STRAP LRG (SOFTGOODS) IMPLANT
SLING ARM IMMOBILIZER LRG (SOFTGOODS) IMPLANT
SLING ARM IMMOBILIZER MED (SOFTGOODS) IMPLANT
SLING ARM MED ADULT FOAM STRAP (SOFTGOODS) IMPLANT
SLING ARM XL FOAM STRAP (SOFTGOODS) IMPLANT
SPONGE LAP 4X18 X RAY DECT (DISPOSABLE) IMPLANT
SUCTION FRAZIER HANDLE 10FR (MISCELLANEOUS)
SUCTION TUBE FRAZIER 10FR DISP (MISCELLANEOUS) IMPLANT
SUT ETHIBOND 2 OS 4 DA (SUTURE) IMPLANT
SUT ETHILON 2 0 FS 18 (SUTURE) IMPLANT
SUT ETHILON 3 0 PS 1 (SUTURE) IMPLANT
SUT FIBERWIRE #2 38 T-5 BLUE (SUTURE)
SUT TIGER TAPE 7 IN WHITE (SUTURE) IMPLANT
SUT VIC AB 0 CT1 27 (SUTURE)
SUT VIC AB 0 CT1 27XBRD ANBCTR (SUTURE) IMPLANT
SUT VIC AB 2-0 SH 27 (SUTURE)
SUT VIC AB 2-0 SH 27XBRD (SUTURE) IMPLANT
SUT VIC AB 3-0 FS2 27 (SUTURE) IMPLANT
SUTURE FIBERWR #2 38 T-5 BLUE (SUTURE) IMPLANT
SYR 20CC LL (SYRINGE) ×2 IMPLANT
TAPE FIBER 2MM 7IN #2 BLUE (SUTURE) IMPLANT
TOWEL OR 17X24 6PK STRL BLUE (TOWEL DISPOSABLE) ×1 IMPLANT
TOWEL OR NON WOVEN STRL DISP B (DISPOSABLE) ×1 IMPLANT
WATER STERILE IRR 1000ML POUR (IV SOLUTION) ×1 IMPLANT
YANKAUER SUCT BULB TIP NO VENT (SUCTIONS) IMPLANT

## 2017-01-18 NOTE — Anesthesia Procedure Notes (Signed)
Anesthesia Regional Block: Interscalene brachial plexus block   Pre-Anesthetic Checklist: ,, timeout performed, Correct Patient, Correct Site, Correct Laterality, Correct Procedure, Correct Position, site marked, Risks and benefits discussed, pre-op evaluation,  At surgeon's request and post-op pain management  Laterality: Left  Prep: chloraprep       Needles:   Needle Type: Echogenic Needle     Needle Length: 9cm  Needle Gauge: 21     Additional Needles:   Procedures:,,,, ultrasound used (permanent image in chart),,,,  Narrative:  Start time: 01/18/2017 8:45 AM End time: 01/18/2017 8:52 AM Injection made incrementally with aspirations every 5 mL. Anesthesiologist: Lyndle Herrlich, MD

## 2017-01-18 NOTE — Anesthesia Preprocedure Evaluation (Addendum)
Anesthesia Evaluation  Patient identified by MRN, date of birth, ID band Patient awake    Reviewed: Allergy & Precautions, H&P , Patient's Chart, lab work & pertinent test results, reviewed documented beta blocker date and time   Airway Mallampati: II  TM Distance: >3 FB Neck ROM: full    Dental no notable dental hx. (+) Missing, Caps, Upper Dentures,    Pulmonary former smoker,    Pulmonary exam normal breath sounds clear to auscultation       Cardiovascular hypertension, Pt. on medications + CAD and + DOE   Rhythm:regular Rate:Normal     Neuro/Psych Anxiety    GI/Hepatic GERD  Medicated and Controlled,  Endo/Other  diabetesHypothyroidism   Renal/GU      Musculoskeletal   Abdominal   Peds  Hematology   Anesthesia Other Findings   Reproductive/Obstetrics                            Anesthesia Physical Anesthesia Plan  ASA: II  Anesthesia Plan: MAC   Post-op Pain Management:  Regional for Post-op pain   Induction: Intravenous  PONV Risk Score and Plan:   Airway Management Planned: Mask  Additional Equipment:   Intra-op Plan:   Post-operative Plan:   Informed Consent: I have reviewed the patients History and Physical, chart, labs and discussed the procedure including the risks, benefits and alternatives for the proposed anesthesia with the patient or authorized representative who has indicated his/her understanding and acceptance.   Dental Advisory Given  Plan Discussed with: CRNA and Surgeon  Anesthesia Plan Comments: (  )       Anesthesia Quick Evaluation

## 2017-01-18 NOTE — Interval H&P Note (Signed)
History and Physical Interval Note:  01/18/2017 7:24 AM  Brett Lowery  has presented today for surgery, with the diagnosis of FROZEN LEFT SHOULDER  The various methods of treatment have been discussed with the patient and family. After consideration of risks, benefits and other options for treatment, the patient has consented to  Procedure(s): ARTHROSCOPY SHOULDER MANIPULATION LYSIS OF ADHESIONS (Left) LYSIS OF ADHESION (Left) as a surgical intervention .  The patient's history has been reviewed, patient examined, no change in status, stable for surgery.  I have reviewed the patient's chart and labs.  Questions were answered to the patient's satisfaction.     Johan Antonacci D

## 2017-01-18 NOTE — Transfer of Care (Signed)
Immediate Anesthesia Transfer of Care Note  Patient: Brett Lowery  Procedure(s) Performed: ARTHROSCOPY SHOULDER MANIPULATION LYSIS OF ADHESIONS (Left ) LYSIS OF ADHESION (Left )  Patient Location: PACU  Anesthesia Type:MAC combined with regional for post-op pain  Level of Consciousness: awake, alert , oriented and patient cooperative  Airway & Oxygen Therapy: Patient Spontanous Breathing and Patient connected to face mask oxygen  Post-op Assessment: Report given to RN and Post -op Vital signs reviewed and stable  Post vital signs: Reviewed and stable  Last Vitals:  Vitals:   01/18/17 0855 01/18/17 0900  BP:  127/71  Pulse: 85 84  Resp: 16 17  Temp:    SpO2: 99% 99%    Last Pain:  Vitals:   01/18/17 0804  TempSrc: Oral  PainSc: 3       Patients Stated Pain Goal: 1 (38/25/05 3976)  Complications: No apparent anesthesia complications

## 2017-01-18 NOTE — Discharge Instructions (Signed)

## 2017-01-18 NOTE — Anesthesia Procedure Notes (Deleted)
Anesthesia Regional Block: Narrative:       

## 2017-01-18 NOTE — Op Note (Signed)
01/18/2017  9:37 AM  PATIENT:  Brett Lowery    PRE-OPERATIVE DIAGNOSIS:  FROZEN LEFT SHOULDER  POST-OPERATIVE DIAGNOSIS:  Same  PROCEDURE:  CLOSED MANIPULATION SHOULDER WITH STEROID INJECTION  SURGEON:  MURPHY, Ernesta Amble, MD  ASSISTANT: Roxan Hockey, PA-C, he was present and scrubbed throughout the case, critical for completion in a timely fashion, and for retraction, instrumentation, and closure.   ANESTHESIA:   MAC  PREOPERATIVE INDICATIONS:  [name] is a  61 y.o. male with a diagnosis of FROZEN LEFT SHOULDER who failed conservative measures and elected for surgical management.    The risks benefits and alternatives were discussed with the patient preoperatively including but not limited to the risks of infection, bleeding, nerve injury, cardiopulmonary complications, the need for revision surgery, among others, and the patient was willing to proceed.  OPERATIVE FINDINGS: ROM 90FE, 45abd, after manip: 170 FE, 90abd   OPERATIVE PROCEDURE:  Patient was identified in the preoperative holding area and site was marked by me He was transported to the operating theater and placed on the table in supine position taking care to pad all bony prominences. After a preincinduction time out anesthesia was induced. Abx were held until decision was made for arthroscopy.  His pre-manipulation range of motion was 90 of forward elevation and 45 of abduction.  I then performed a firm manipulation there was palpable palpable and audible disruption of scar tissue is able to get him 90 of abduction and 170 of forward elevation.  I then prepped his posterior shoulder with alcohol and injected 80 mg of Depo-Medrol into his glenohumeral and subacromial space.  Band-Aid was placed here.  He was awoken and taken to the PACU in stable condition  POST OPERATIVE PLAN: Start PT tomorrow, mobilize for DVT px

## 2017-01-18 NOTE — Progress Notes (Signed)
Assisted Dr. Lyndle Herrlich with left, ultrasound guided, supraclavicular block. Side rails up, monitors on throughout procedure. See vital signs in flow sheet. Tolerated Procedure well.

## 2017-01-18 NOTE — Anesthesia Postprocedure Evaluation (Signed)
Anesthesia Post Note  Patient: Brett Lowery  Procedure(s) Performed: CLOSED MANIPULATION SHOULDER WITH STEROID INJECTION (Left Shoulder)     Patient location during evaluation: PACU Anesthesia Type: MAC Level of consciousness: awake and alert Pain management: pain level controlled Vital Signs Assessment: post-procedure vital signs reviewed and stable Respiratory status: spontaneous breathing, nonlabored ventilation, respiratory function stable and patient connected to nasal cannula oxygen Cardiovascular status: stable and blood pressure returned to baseline Postop Assessment: no apparent nausea or vomiting Anesthetic complications: no    Last Vitals:  Vitals:   01/18/17 1000 01/18/17 1015  BP: 107/81 109/79  Pulse: 83 78  Resp: 15 18  Temp:  36.7 C  SpO2: 92% 96%    Last Pain:  Vitals:   01/18/17 1015  TempSrc:   PainSc: 0-No pain                 Chadd Tollison EDWARD

## 2017-01-19 ENCOUNTER — Encounter (HOSPITAL_BASED_OUTPATIENT_CLINIC_OR_DEPARTMENT_OTHER): Payer: Self-pay | Admitting: Orthopedic Surgery

## 2017-01-25 LAB — HM DIABETES EYE EXAM

## 2017-05-16 IMAGING — US US ABDOMEN COMPLETE
1 series · 13 of 25 positions shown · non-contrast
Comparison: CT abdomen and pelvis 09/17/2011

CLINICAL DATA: Chest pain, gastroesophageal reflux disease with
presence of esophagitis not specified, history hypertension,
coronary artery disease, diabetes mellitus

EXAM:
ABDOMEN ULTRASOUND COMPLETE

[Series 1: us abdomen complete · 0.25mm/px · 13 of 94 slices shown]
[im 1/94]
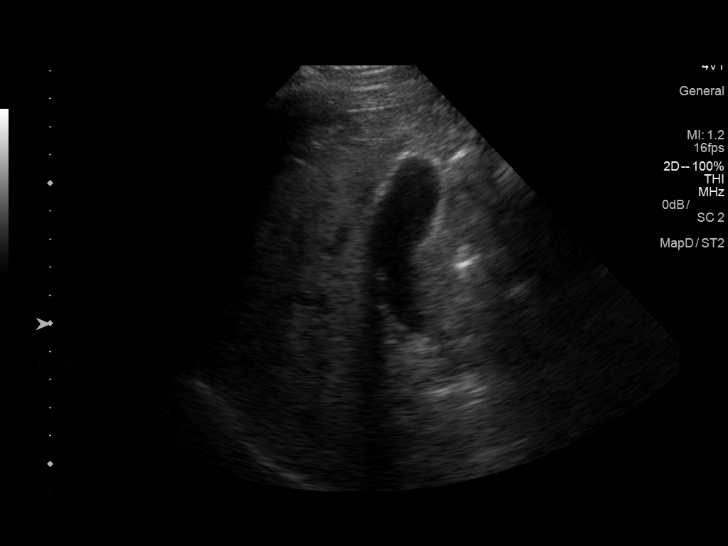
[im 8/94]
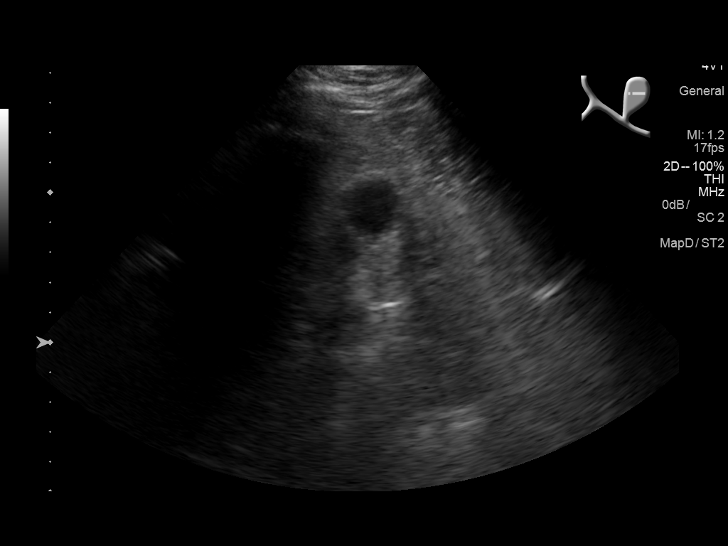
[im 16/94]
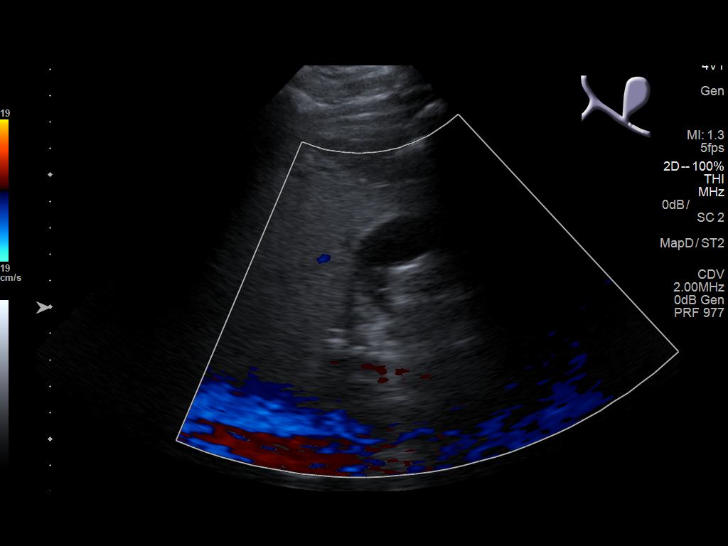
[im 24/94]
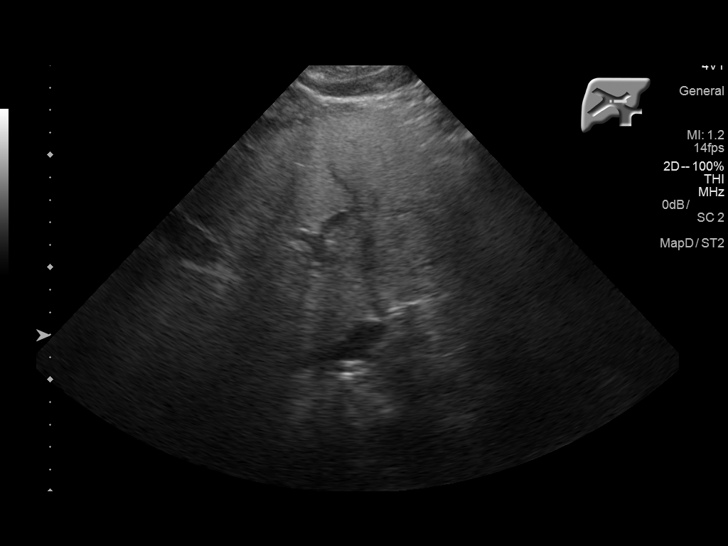
[im 32/94]
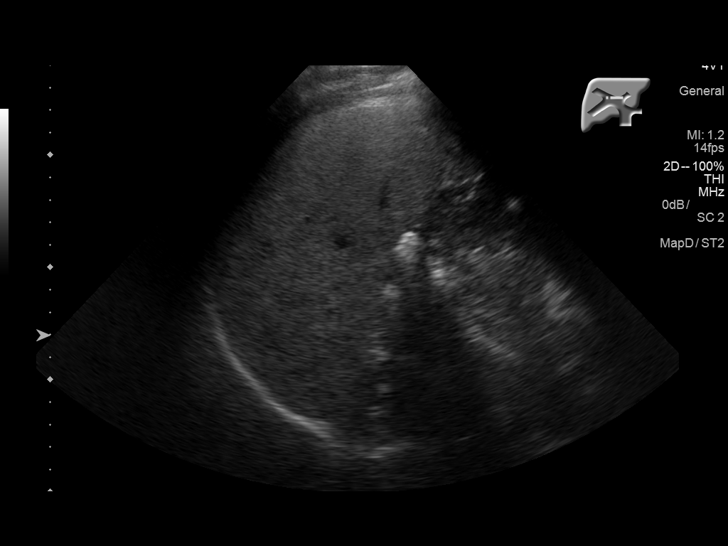
[im 39/94]
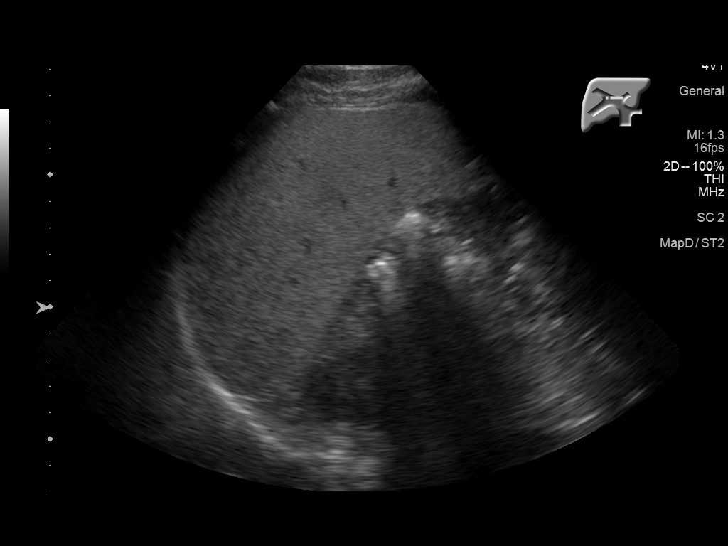
[im 47/94]
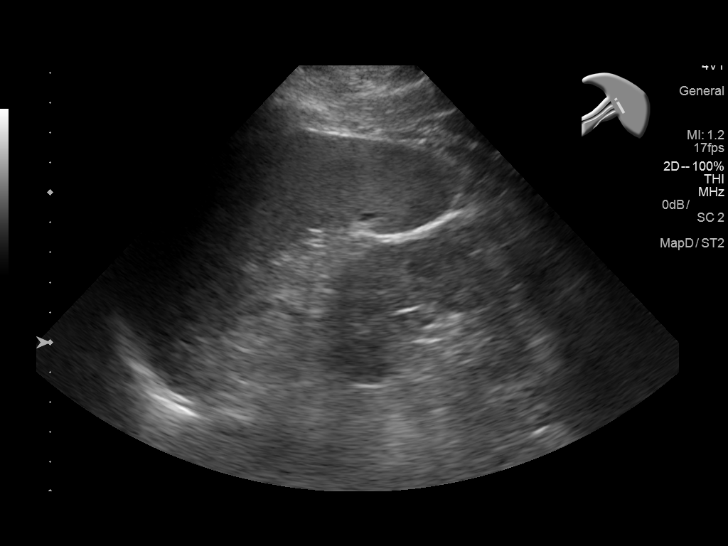
[im 55/94]
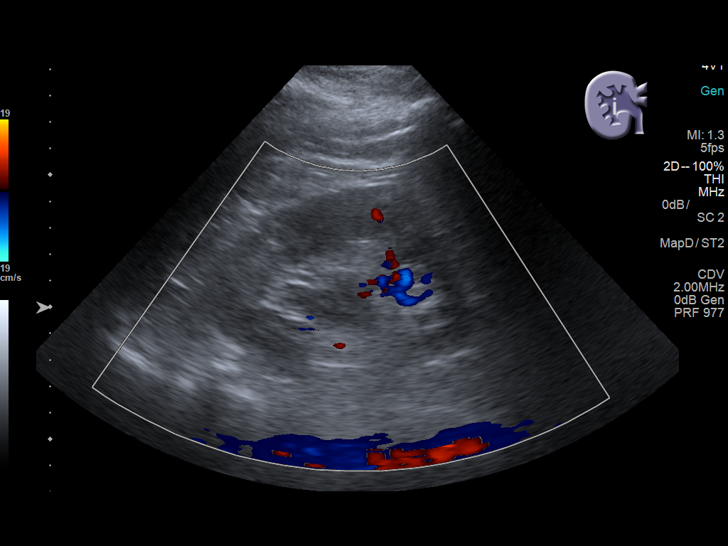
[im 63/94]
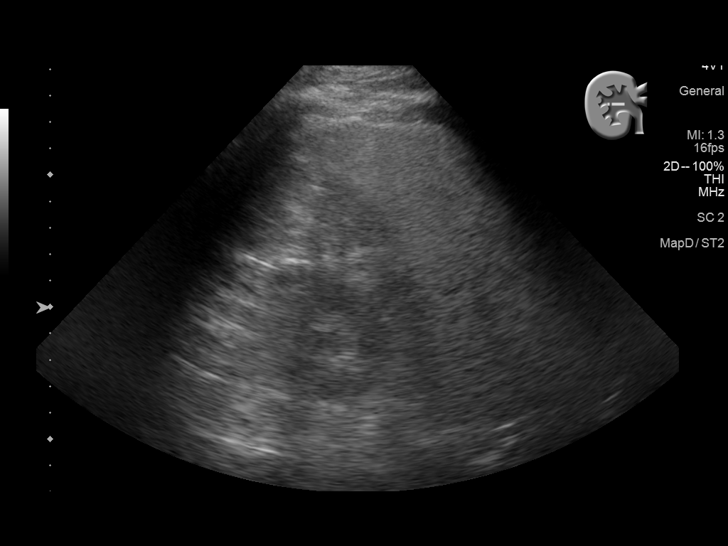
[im 70/94]
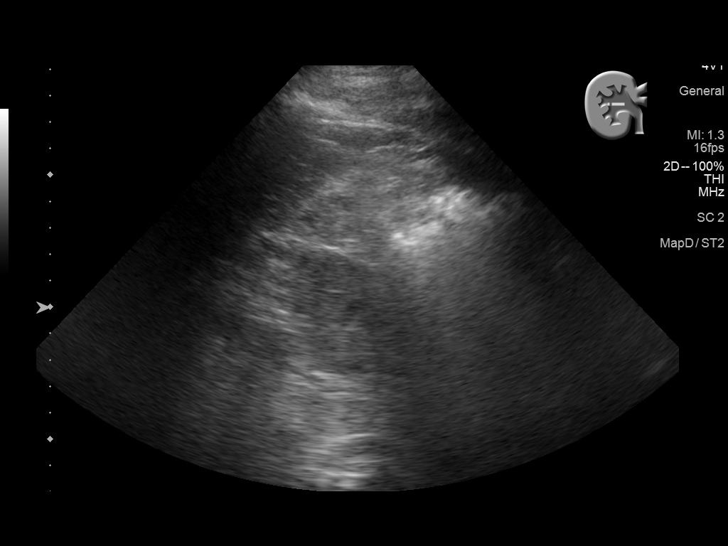
[im 78/94]
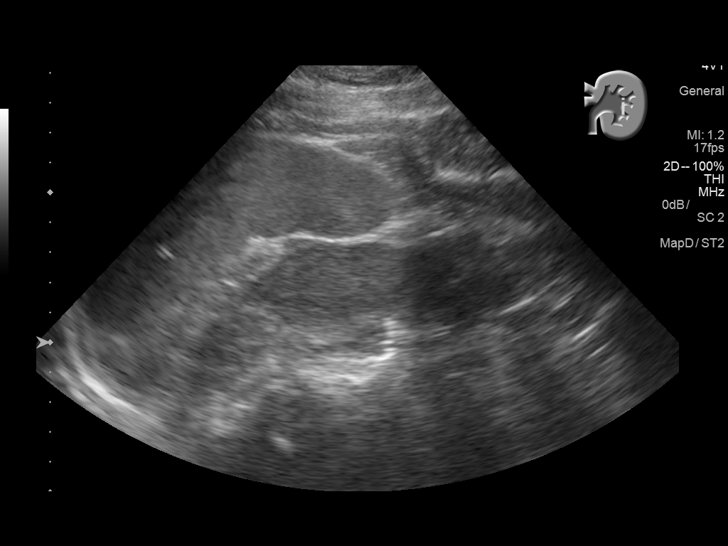
[im 86/94]
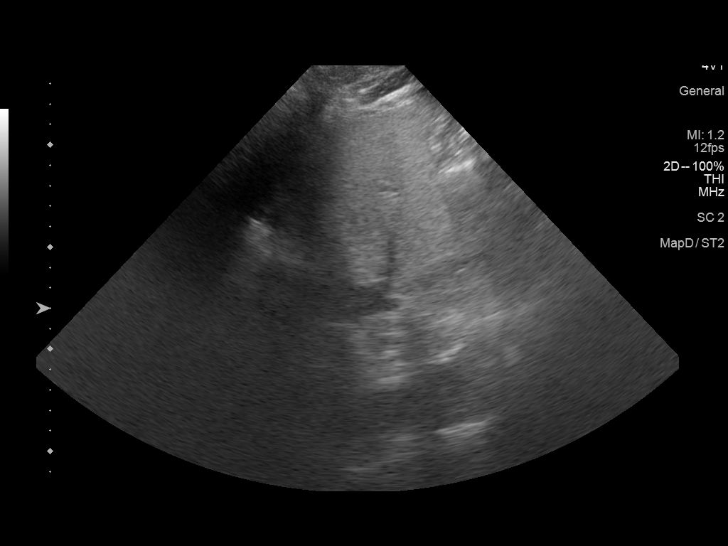
[im 94/94]
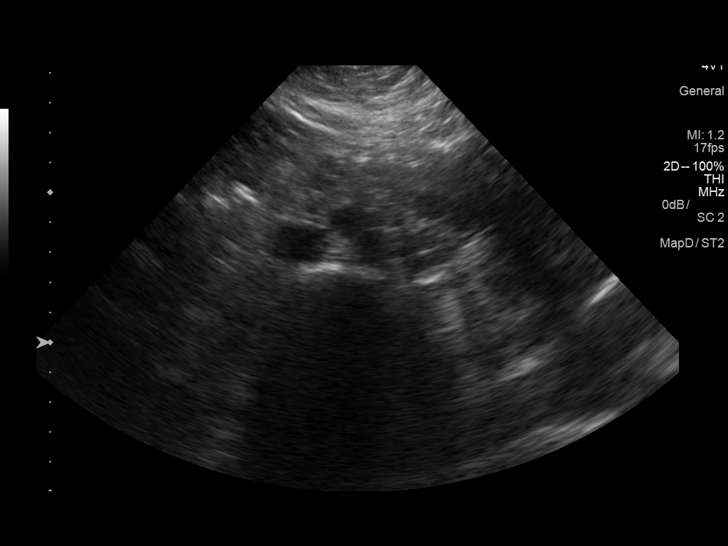

[13 of 25 positions shown; findings below may reference images not displayed]

FINDINGS: Gallbladder: Normally distended without stones or wall thickening.
No pericholecystic fluid or sonographic Murphy sign.

Common bile duct: Diameter: 4 mm diameter, normal

Liver: Echogenic, likely fatty infiltration, though this can be seen
with cirrhosis and certain infiltrative disorders. No focal hepatic
mass or nodularity. Hepatopetal portal venous flow

IVC: Normal appearance

Pancreas: Obscured by bowel gas

Spleen: Normal appearance, 10.9 cm length

Right Kidney: Length: 10.8 cm. Normal cortical thickness. Upper
normal cortical echogenicity. Complex nodule upper pole 15 x 10 x 14
mm, containing hypoechoic and a hyperechoic nonshadowing focus.
Lesion rocks corresponds to position of a 10 mm diameter cyst on the
prior CT. No additional mass. Small peripelvic renal cyst again
identified 2.2 x 1.5 x 1.1 cm, slightly increased since previous
exam.

Left Kidney: Length: 11.6 cm. Normal morphology without mass or
hydronephrosis.

Abdominal aorta: Normal caliber

Other findings: No free fluid
IMPRESSION: Probable fatty infiltration of liver as above.

Nonvisualization of pancreas.

15 x 10 x 14 mm RIGHT upper pole renal lesion, complex appearance,
roughly corresponding in position to a 10 mm cyst on the prior study
though appearance is complex and indeterminate by ultrasound;
followup characterization by MR imaging with and without contrast
recommended.

## 2017-06-02 ENCOUNTER — Other Ambulatory Visit: Payer: Self-pay | Admitting: Family Medicine

## 2017-06-15 ENCOUNTER — Other Ambulatory Visit: Payer: Self-pay | Admitting: Family Medicine

## 2017-06-28 ENCOUNTER — Other Ambulatory Visit (INDEPENDENT_AMBULATORY_CARE_PROVIDER_SITE_OTHER): Payer: PRIVATE HEALTH INSURANCE

## 2017-06-28 ENCOUNTER — Telehealth: Payer: Self-pay | Admitting: Family Medicine

## 2017-06-28 ENCOUNTER — Telehealth: Payer: Self-pay

## 2017-06-28 DIAGNOSIS — E890 Postprocedural hypothyroidism: Secondary | ICD-10-CM

## 2017-06-28 DIAGNOSIS — E7800 Pure hypercholesterolemia, unspecified: Secondary | ICD-10-CM

## 2017-06-28 DIAGNOSIS — Z125 Encounter for screening for malignant neoplasm of prostate: Secondary | ICD-10-CM

## 2017-06-28 LAB — T3, FREE: T3 FREE: 3.4 pg/mL (ref 2.3–4.2)

## 2017-06-28 LAB — COMPREHENSIVE METABOLIC PANEL
ALBUMIN: 4.4 g/dL (ref 3.5–5.2)
ALT: 20 U/L (ref 0–53)
AST: 22 U/L (ref 0–37)
Alkaline Phosphatase: 65 U/L (ref 39–117)
BUN: 16 mg/dL (ref 6–23)
CHLORIDE: 101 meq/L (ref 96–112)
CO2: 34 meq/L — AB (ref 19–32)
CREATININE: 1.06 mg/dL (ref 0.40–1.50)
Calcium: 9.7 mg/dL (ref 8.4–10.5)
GFR: 75.63 mL/min (ref >60.00)
Glucose, Bld: 102 mg/dL — ABNORMAL HIGH (ref 70–99)
POTASSIUM: 4 meq/L (ref 3.5–5.1)
SODIUM: 141 meq/L (ref 135–145)
Total Bilirubin: 0.6 mg/dL (ref 0.2–1.2)
Total Protein: 7 g/dL (ref 6.0–8.3)

## 2017-06-28 LAB — LIPID PANEL
CHOL/HDL RATIO: 4
CHOLESTEROL: 178 mg/dL (ref 0–200)
HDL: 47.2 mg/dL (ref >39.00)
LDL CALC: 113 mg/dL — AB (ref 0–99)
NonHDL: 130.84
Triglycerides: 91 mg/dL (ref 0.0–149.0)
VLDL: 18.2 mg/dL (ref 0.0–40.0)

## 2017-06-28 LAB — PSA: PSA: 2.08 ng/mL (ref 0.10–4.00)

## 2017-06-28 LAB — T4, FREE: Free T4: 1.19 ng/dL (ref 0.60–1.60)

## 2017-06-28 LAB — TSH: TSH: 0.39 u[IU]/mL (ref 0.35–4.50)

## 2017-06-28 NOTE — Telephone Encounter (Signed)
Mr. Kerby notified as instructed by telephone.

## 2017-06-28 NOTE — Telephone Encounter (Signed)
-----   Message from Ellamae Sia sent at 06/22/2017  4:41 PM EDT ----- Regarding: lab orders for Tuesday, 5.7.19 Patient is scheduled for CPX labs, please order future labs, Thanks , Karna Christmas

## 2017-06-28 NOTE — Telephone Encounter (Signed)
Patient dropped off information he received from his Insurance/[employer] in regards to certain lab tests and how they need to be filled and what is not considered as preventative. Placed this form in the RX tower. Thank Edrick Kins, RMA

## 2017-06-28 NOTE — Telephone Encounter (Signed)
Let pt know that I always put in diagnosis code for his thyroid test.. Never preventative.

## 2017-07-05 ENCOUNTER — Other Ambulatory Visit: Payer: Self-pay

## 2017-07-05 ENCOUNTER — Encounter: Payer: Self-pay | Admitting: Family Medicine

## 2017-07-05 ENCOUNTER — Ambulatory Visit (INDEPENDENT_AMBULATORY_CARE_PROVIDER_SITE_OTHER): Payer: PRIVATE HEALTH INSURANCE | Admitting: Family Medicine

## 2017-07-05 VITALS — BP 110/62 | HR 67 | Temp 97.6°F | Ht 66.69 in | Wt 173.2 lb

## 2017-07-05 DIAGNOSIS — R079 Chest pain, unspecified: Secondary | ICD-10-CM

## 2017-07-05 DIAGNOSIS — Z0000 Encounter for general adult medical examination without abnormal findings: Secondary | ICD-10-CM

## 2017-07-05 DIAGNOSIS — N401 Enlarged prostate with lower urinary tract symptoms: Secondary | ICD-10-CM | POA: Diagnosis not present

## 2017-07-05 DIAGNOSIS — E7800 Pure hypercholesterolemia, unspecified: Secondary | ICD-10-CM

## 2017-07-05 DIAGNOSIS — R351 Nocturia: Secondary | ICD-10-CM | POA: Diagnosis not present

## 2017-07-05 DIAGNOSIS — E119 Type 2 diabetes mellitus without complications: Secondary | ICD-10-CM

## 2017-07-05 DIAGNOSIS — G8929 Other chronic pain: Secondary | ICD-10-CM

## 2017-07-05 DIAGNOSIS — I10 Essential (primary) hypertension: Secondary | ICD-10-CM

## 2017-07-05 DIAGNOSIS — E890 Postprocedural hypothyroidism: Secondary | ICD-10-CM | POA: Diagnosis not present

## 2017-07-05 LAB — POCT GLYCOSYLATED HEMOGLOBIN (HGB A1C): Hemoglobin A1C: 5.8

## 2017-07-05 MED ORDER — TAMSULOSIN HCL 0.4 MG PO CAPS: 0.4000 mg | ORAL_CAPSULE | Freq: Every day | ORAL | 11 refills | Status: DC

## 2017-07-05 NOTE — Assessment & Plan Note (Signed)
Start trial of flomax daily. Follow over time.

## 2017-07-05 NOTE — Assessment & Plan Note (Signed)
Due for a1c and microalbumin today.

## 2017-07-05 NOTE — Addendum Note (Signed)
Addended by: Ellamae Sia on: 07/05/2017 03:40 PM   Modules accepted: Orders

## 2017-07-05 NOTE — Patient Instructions (Addendum)
Stop HCTZ, follow BP at home.. Goal BP < 140/90. Call if it goes to high off the medication.  If dizziness is improving off of HCTZ.. Can start tamsulosin for urinary flow issues.

## 2017-07-05 NOTE — Assessment & Plan Note (Signed)
Stable

## 2017-07-05 NOTE — Assessment & Plan Note (Signed)
Now on 20 mg daily of pravastatin tolerating. Continue for now and follow over time. Encouraged exercise, weight loss, healthy eating habits.

## 2017-07-05 NOTE — Assessment & Plan Note (Signed)
Lower BP and occ orthostatic symptoms. Stop HCTZ and follow.

## 2017-07-05 NOTE — Assessment & Plan Note (Signed)
Well controlled. Continue current medication.  

## 2017-07-05 NOTE — Progress Notes (Signed)
Subjective:    Patient ID: Brett Lowery, male    DOB: 1955-06-30, 62 y.o.   MRN: [id number]  HPI The patient is here for annual wellness exam and preventative care.    Diabetes:   Due for re-eval and for microalbumin. Lab Results  Component Value Date   HGBA1C 6.2 12/02/2016  Using medications without difficulties: Hypoglycemic episodes: Hyperglycemic episodes: Feet problems: no ucler Blood Sugars averaging: not checking eye exam within last year: 03/2017 Wt Readings from Last 3 Encounters:  07/05/17 173 lb 4 oz (78.6 kg)  01/18/17 178 lb (80.7 kg)  12/07/16 178 lb 4 oz (80.9 kg)   Body mass index is 27.39 kg/m.  Hypertension:   Good control  On amlodipine HCTZ   BP Readings from Last 3 Encounters:  07/05/17 110/62  01/18/17 109/79  12/07/16 110/70  Using medication without problems or lightheadedness:  occ with sitting to standing Chest pain with exertion: none, stable tightness at times Edema: none Short of breath: stable Average home BPs: Other issues: HX of CAD  Elevated Cholesterol:  Intolerant of crestor. On pravastatin.. On 20 mg daily.  Not at goal, but he hads been focusing on low carb dioet not low cholesterol. Lab Results  Component Value Date   CHOL 178 06/28/2017   HDL 47.20 06/28/2017   LDLCALC 113 (H) 06/28/2017   TRIG 91.0 06/28/2017   CHOLHDL 4 06/28/2017      Using medications without problems: Muscle aches:  Diet compliance: healthy Exercise: walking little everyday Other complaints:   Hypothyroid  On levo 125 mcg. Lab Results  Component Value Date   TSH 0.39 06/28/2017    He feels like he is having issues getting urine out, pushes to get out urine decreased flow, dribbling. Gets up 2-3 times.  No burning, no hematuria.  Slight increase frequency, no urgency.  Trying to drinks more water.   Social History /Family History/Past Medical History reviewed in detail and updated in EMR if needed. Blood pressure 110/62, pulse 67,  temperature 97.6 F (36.4 C), temperature source Oral, height 5' 6.69" (1.694 m), weight 173 lb 4 oz (78.6 kg).  Review of Systems  Constitutional: Negative for fatigue and fever.  HENT: Negative for ear pain.   Eyes: Negative for pain.  Respiratory: Negative for cough and shortness of breath.   Cardiovascular: Negative for chest pain, palpitations and leg swelling.  Gastrointestinal: Negative for abdominal pain.  Genitourinary: Positive for decreased urine volume. Negative for dysuria.  Musculoskeletal: Negative for arthralgias.  Neurological: Negative for syncope, light-headedness and headaches.  Psychiatric/Behavioral: Negative for dysphoric mood.       Objective:   Physical Exam  Constitutional: He appears well-developed and well-nourished.  Non-toxic appearance. He does not appear ill. No distress.  HENT:  Head: Normocephalic and atraumatic.  Right Ear: Hearing, tympanic membrane, external ear and ear canal normal.  Left Ear: Hearing, tympanic membrane, external ear and ear canal normal.  Nose: Nose normal.  Mouth/Throat: Uvula is midline, oropharynx is clear and moist and mucous membranes are normal.  Eyes: Pupils are equal, round, and reactive to light. Conjunctivae, EOM and lids are normal. Lids are everted and swept, no foreign bodies found.  Neck: Trachea normal, normal range of motion and phonation normal. Neck supple. Carotid bruit is not present. No thyroid mass and no thyromegaly present.  Cardiovascular: Normal rate, regular rhythm, S1 normal, S2 normal, intact distal pulses and normal pulses. Exam reveals no gallop.  No murmur heard. Pulmonary/Chest: Breath sounds normal.  He has no wheezes. He has no rhonchi. He has no rales.  Abdominal: Soft. Normal appearance and bowel sounds are normal. There is no hepatosplenomegaly. There is no tenderness. There is no rebound, no guarding and no CVA tenderness. No hernia.  Lymphadenopathy:    He has no cervical adenopathy.    Neurological: He is alert. He has normal strength and normal reflexes. No cranial nerve deficit or sensory deficit. Gait normal.  Skin: Skin is warm, dry and intact. No rash noted.  Psychiatric: He has a normal mood and affect. His speech is normal and behavior is normal. Judgment normal.   Diabetic foot exam: Normal inspection No skin breakdown No calluses  Normal DP pulses Normal sensation to light touch and monofilament Nails normal        Assessment & Plan:  The patient's preventative maintenance and recommended screening tests for an annual wellness exam were reviewed in full today. Brought up to date unless services declined.  Counselled on the importance of diet, exercise, and its role in overall health and mortality. The patient's FH and SH was reviewed, including their home life, tobacco status, and drug and alcohol status.    Vaccines: Due for PNA and consider shingles. Prostate Cancer Screen:  Stable control. Lab Results  Component Value Date   PSA 2.08 06/28/2017   PSA 1.86 05/26/2016   PSA 1.91 05/19/2015  Colon Cancer Screen: 06/2014, Dr. Fuller Plan No polyps, repeat in 5 years        HIV:  Refused nonsmoker Hep C: done Stable pulm nodule 2013-2015.Marland Kitchen No further imaging needed.

## 2017-07-05 NOTE — Addendum Note (Signed)
Addended by: Eliezer Lofts E on: 07/05/2017 03:36 PM   Modules accepted: Orders

## 2017-07-06 LAB — MICROALBUMIN / CREATININE URINE RATIO
Creatinine,U: 67.9 mg/dL
Microalb Creat Ratio: 1 mg/g (ref 0.0–30.0)

## 2017-07-07 IMAGING — CR DG LUMBAR SPINE COMPLETE 4+V
5 series · 5 of 5 positions shown · non-contrast
Comparison: CT scan of the abdomen dated 09/17/2011

CLINICAL DATA: Right hip pain.

EXAM:
LUMBAR SPINE - COMPLETE 4+ VIEW

[view not recorded (1 of 5)]
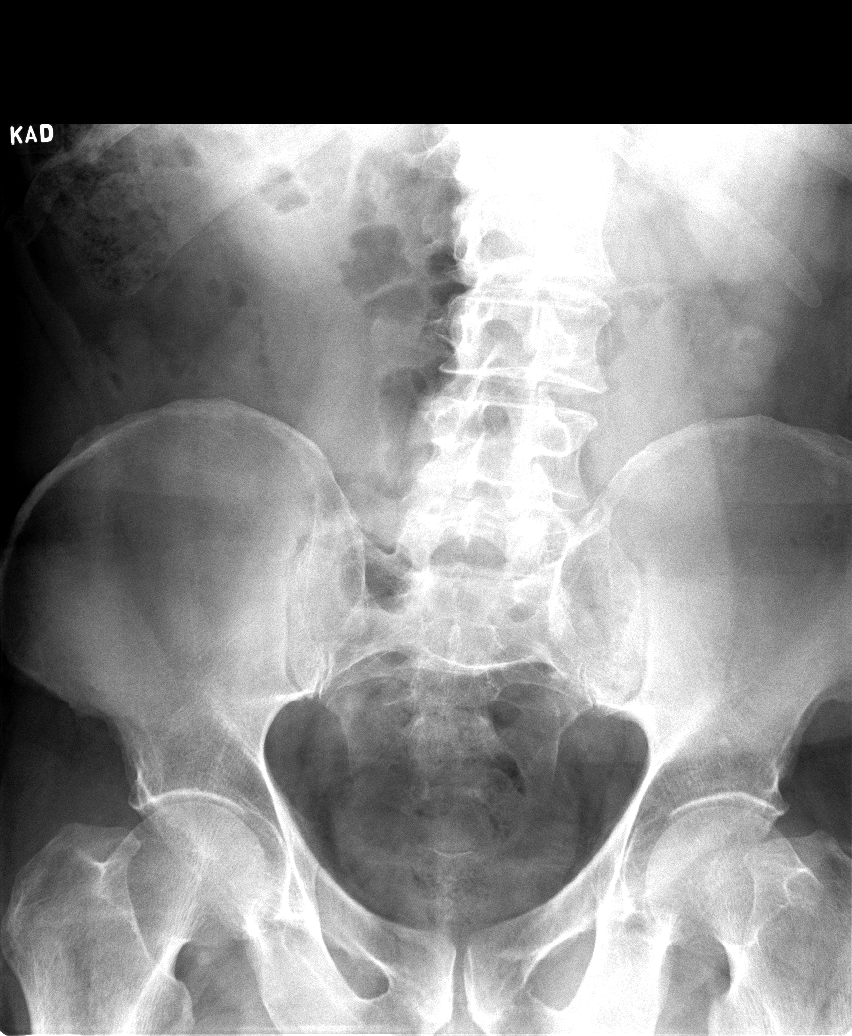

[view not recorded (2 of 5)]
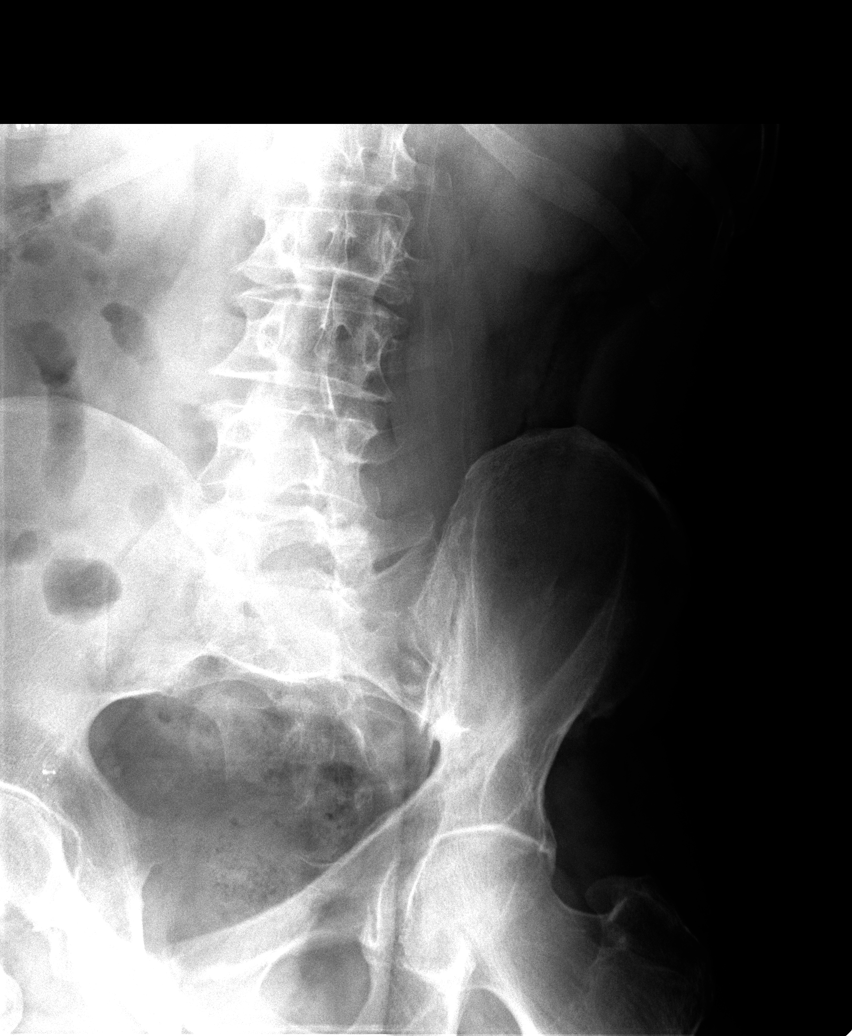

[view not recorded (3 of 5)]
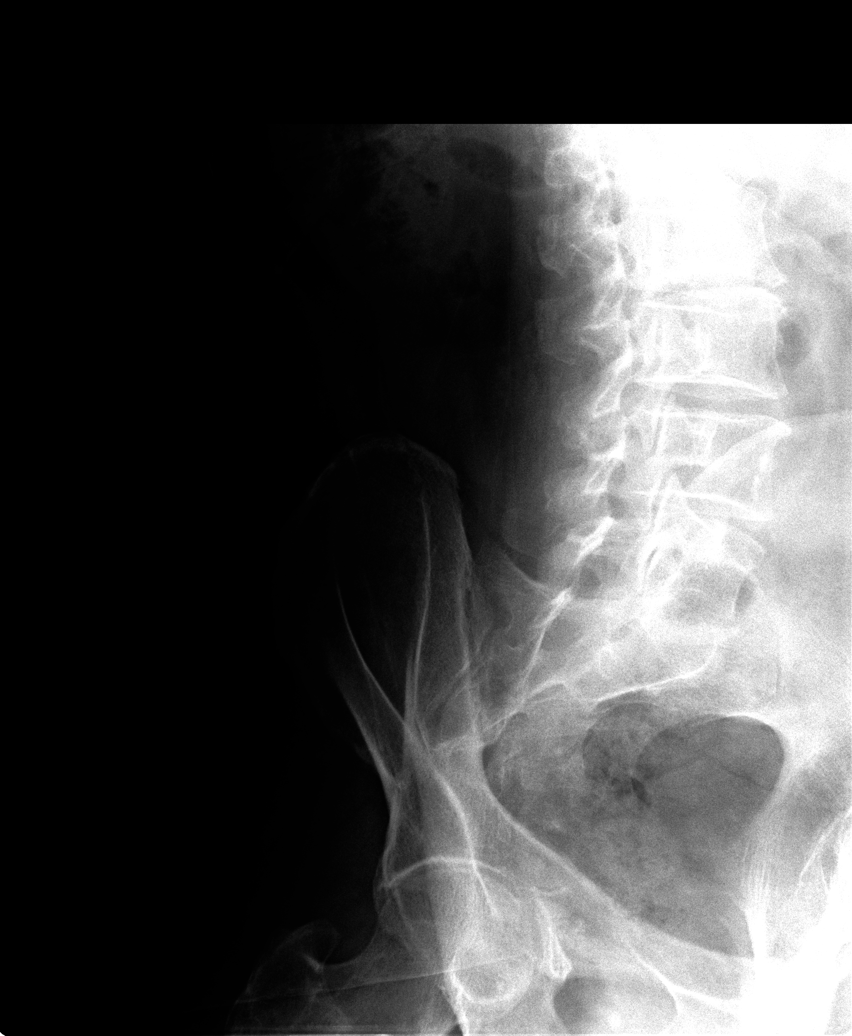

[view not recorded (4 of 5)]
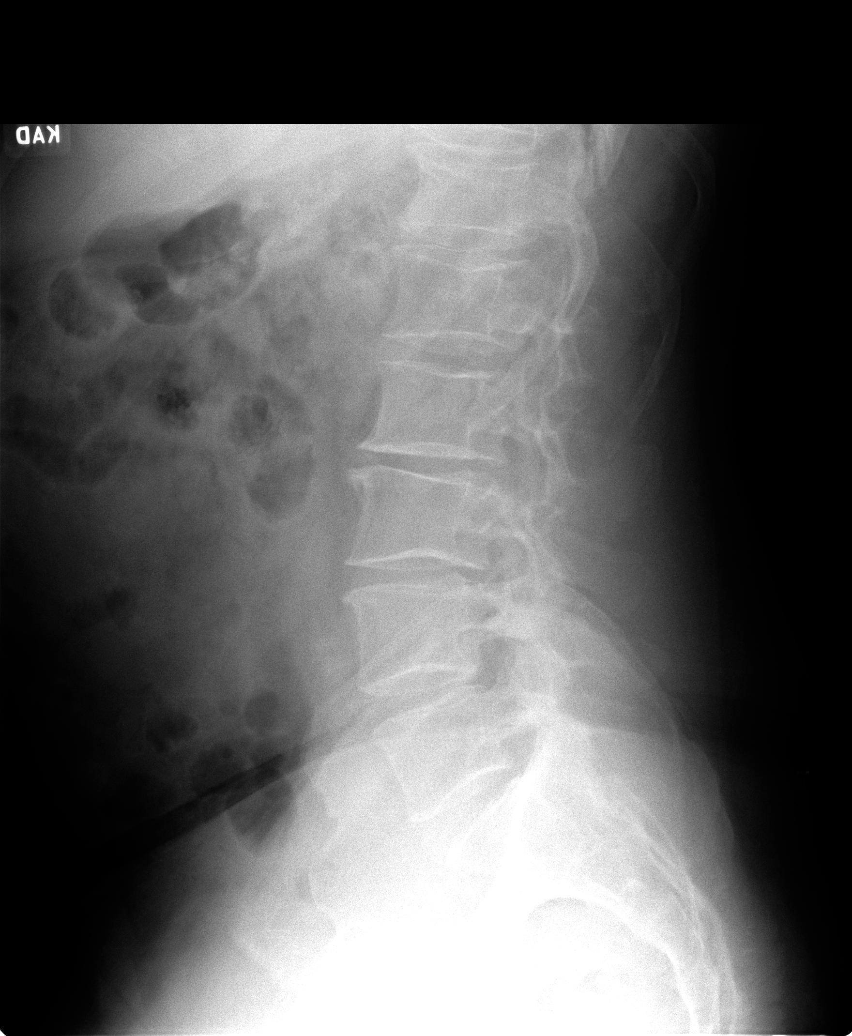

[view not recorded (5 of 5)]
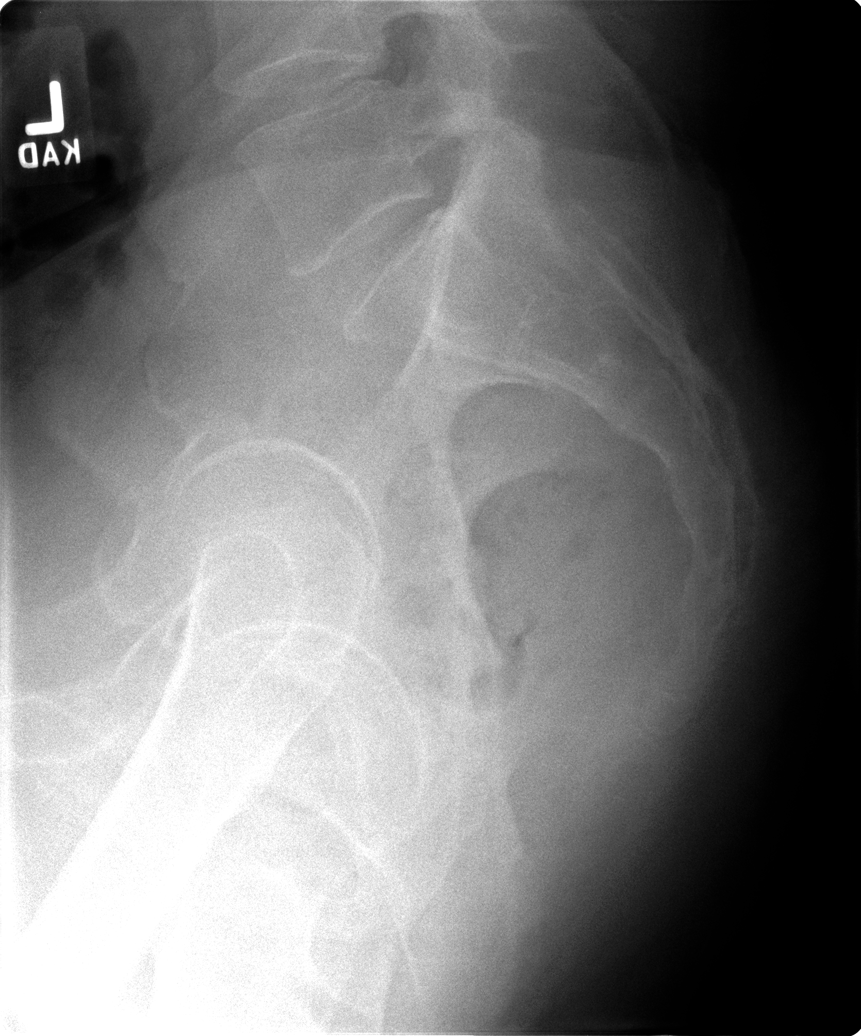

[5 of 5 positions shown; findings below may reference images not displayed]

FINDINGS: There is a lumbar scoliosis to the left centered at L2-3. There is
no disc space narrowing or spondylolisthesis. No bone destruction or
fractures.
IMPRESSION: Lumbar scoliosis. No other significant findings. No significant
change since the prior study.

## 2017-07-07 IMAGING — CR DG PELVIS 1-2V
1 series · 1 of 1 positions shown · non-contrast
Comparison: Lumbar spine series of today's date

CLINICAL DATA: Right hip pain without history of trauma

EXAM:
PELVIS - 1-2 VIEW

[view not recorded]
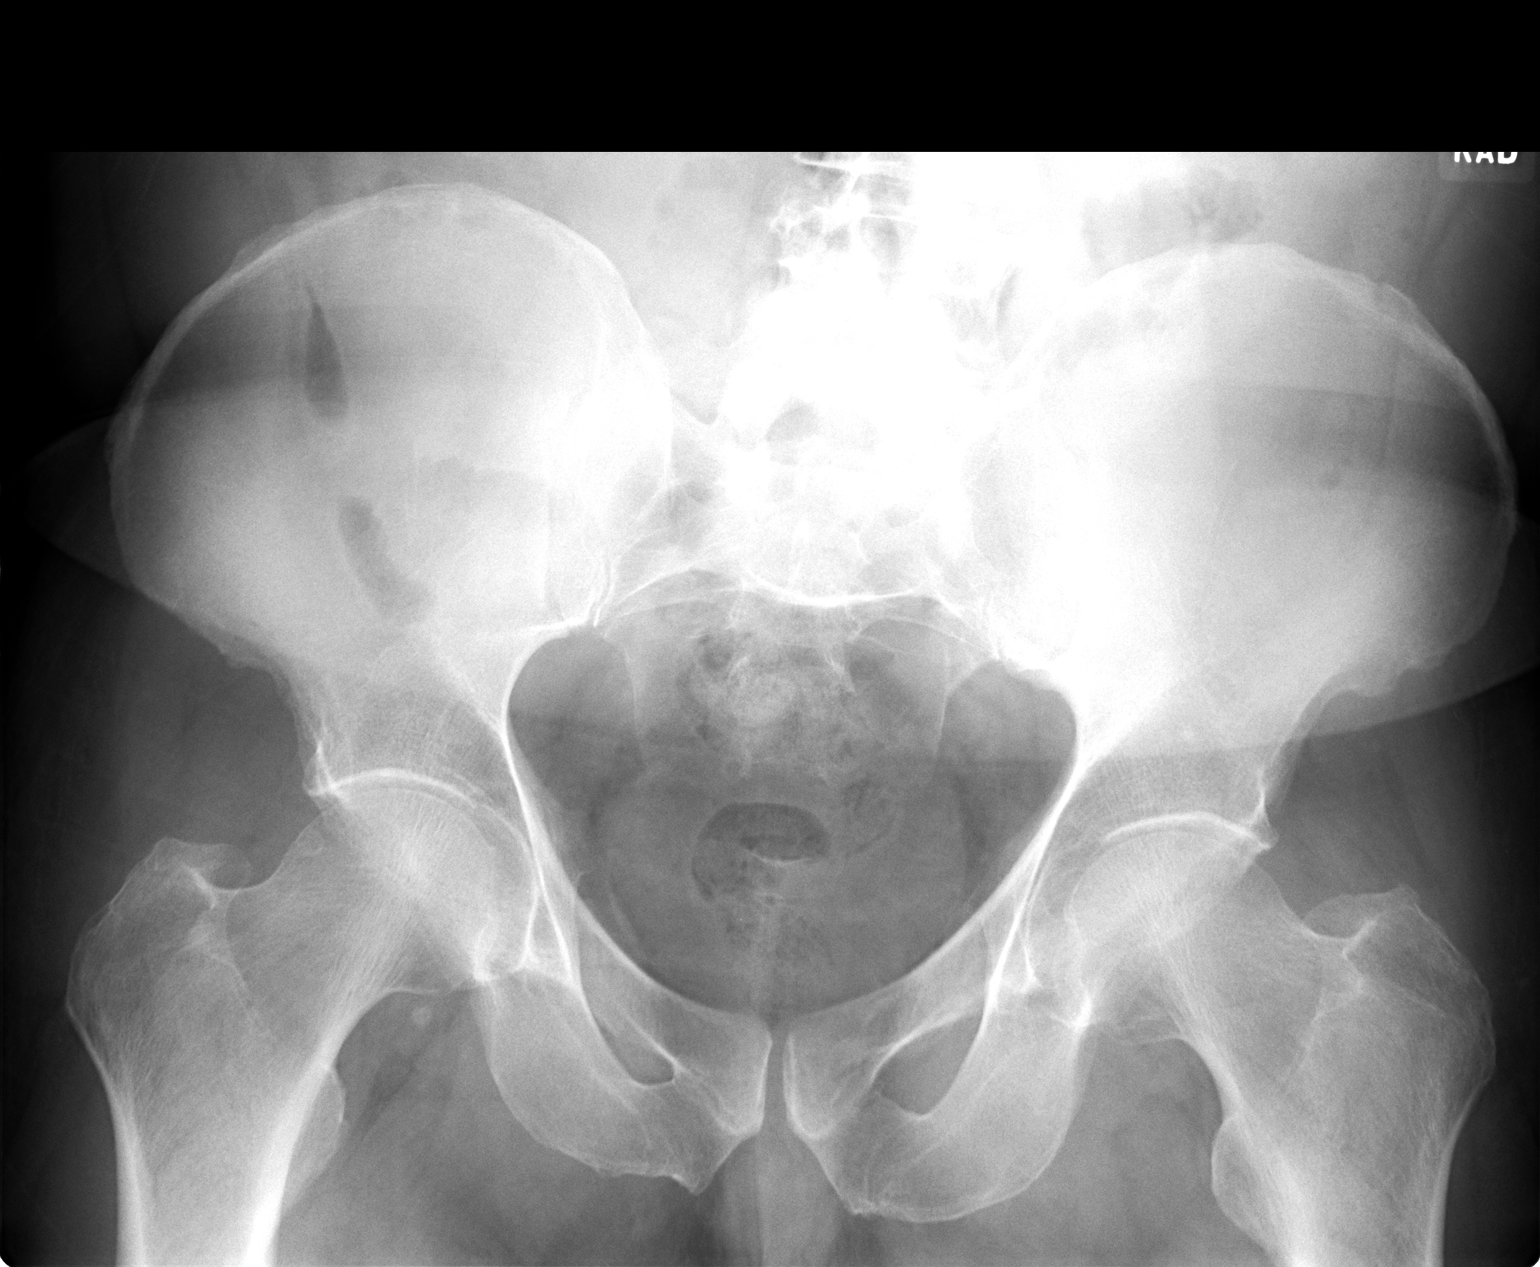

[1 of 1 positions shown; findings below may reference images not displayed]

FINDINGS: The bony pelvis is adequately mineralized. There is no lytic or
blastic lesion. The sacrum and SI joints are grossly normal.

There is moderate symmetrical bilateral hip joint space narrowing
slightly greater on the left than on the right. The femoral heads
and acetabuli remains smoothly rounded. The femoral neck and
intertrochanteric regions are normal.
IMPRESSION: There is moderate hip joint space narrowing bilaterally consistent
with osteoarthritis. There is no acute bony abnormality of the
pelvis.

## 2017-07-14 ENCOUNTER — Other Ambulatory Visit: Payer: Self-pay | Admitting: Family Medicine

## 2017-07-20 ENCOUNTER — Other Ambulatory Visit: Payer: Self-pay | Admitting: Family Medicine

## 2017-08-10 IMAGING — MR MR LUMBAR SPINE W/O CM
4 of 5 series · 25 of 48 positions shown · non-contrast
Comparison: CT abdomen and pelvis September 17, 2011

CLINICAL DATA: Low back pain radiating to RIGHT hip for 1 year. No
injury. Steroid injection 3 weeks ago. Evaluate low back pain with
radiculopathy.

EXAM:
MRI LUMBAR SPINE WITHOUT CONTRAST
TECHNIQUE: Multiplanar, multisequence MR imaging of the lumbar spine was
performed. No intravenous contrast was administered.

[Series 3: T2 · sagittal · 4.0mm · 0.55mm/px · 6 of 15 slices shown (1 of 2)]
[im 1/15]
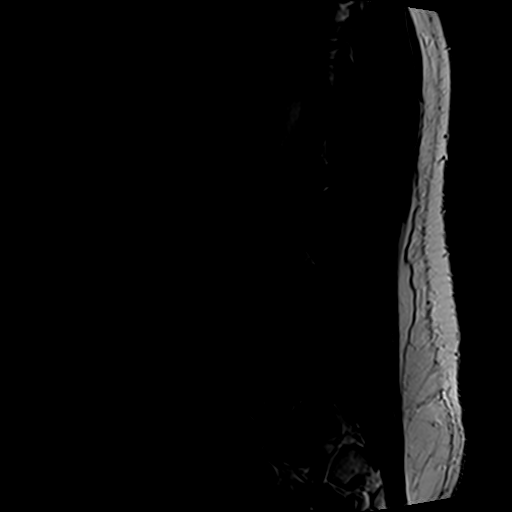
[im 3/15]
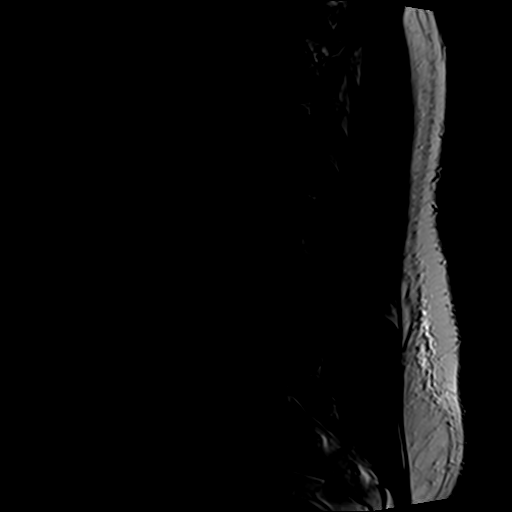
[im 6/15]
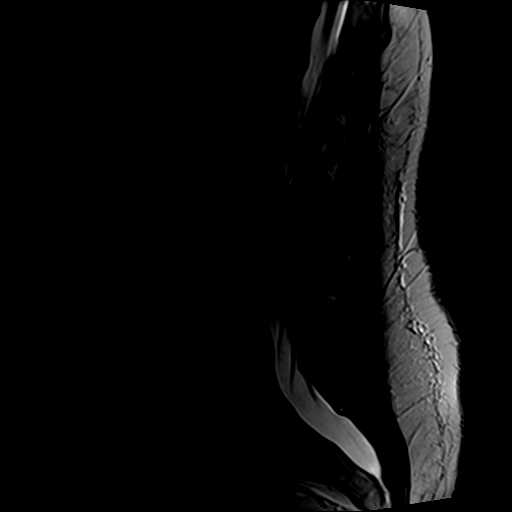
[im 9/15]
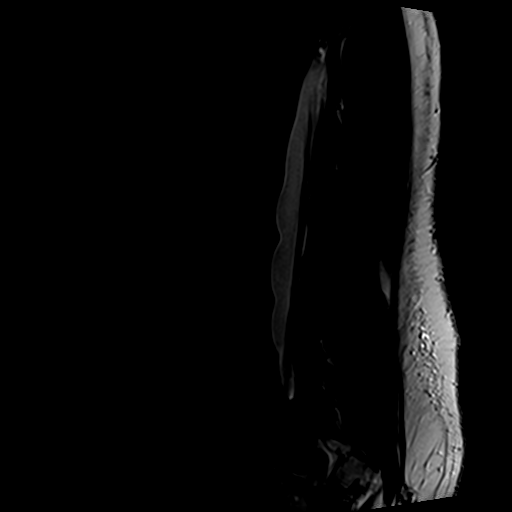
[im 12/15]
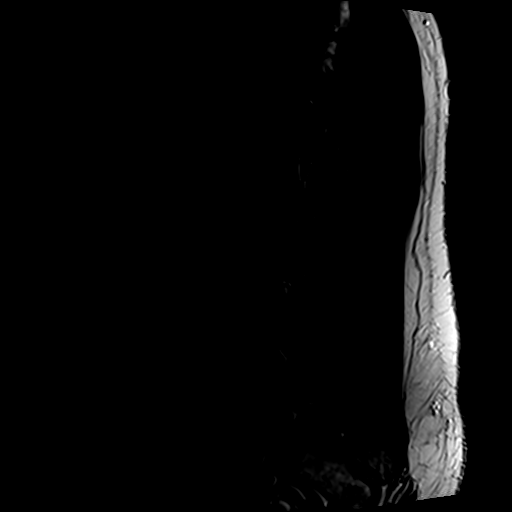
[im 15/15]
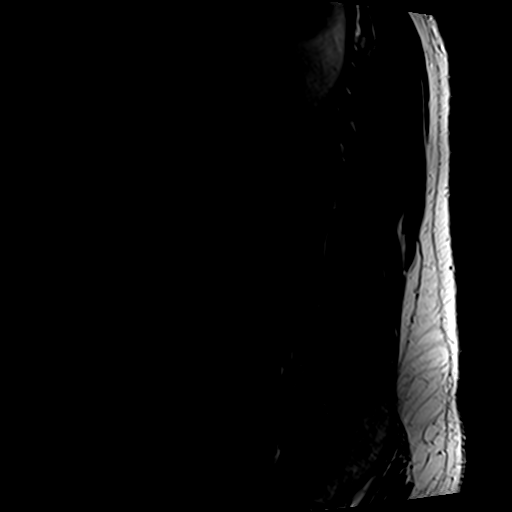

[Series 4: T1 · sagittal · 4.0mm · 0.55mm/px · 6 of 15 slices shown (1 of 2)]
[im 1/15]
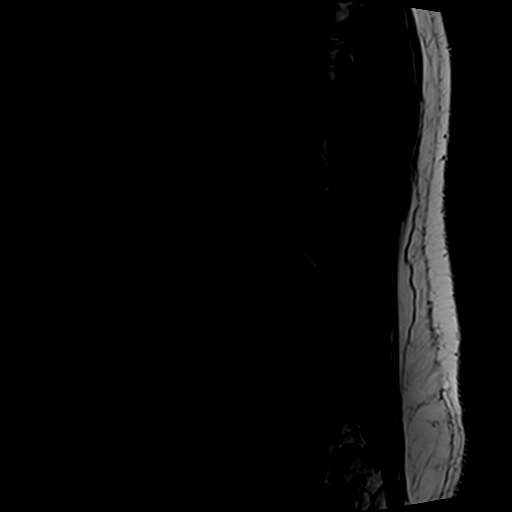
[im 3/15]
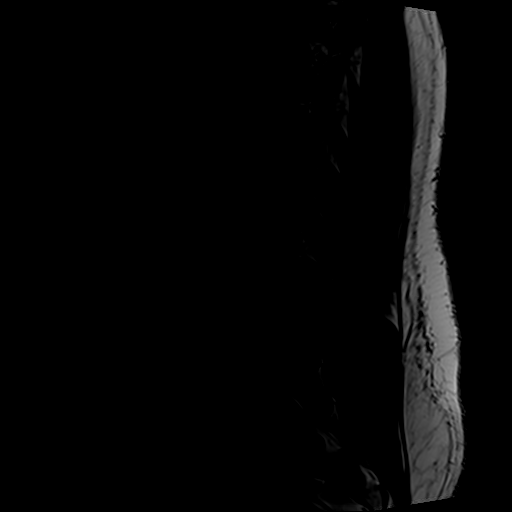
[im 6/15]
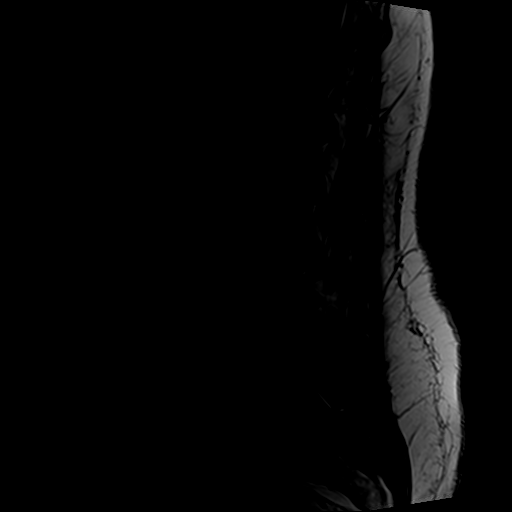
[im 9/15]
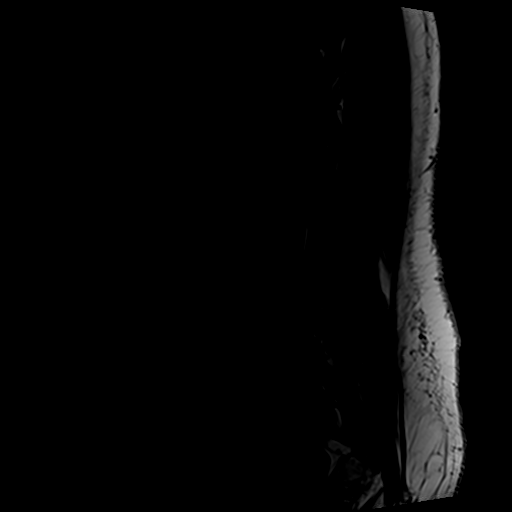
[im 12/15]
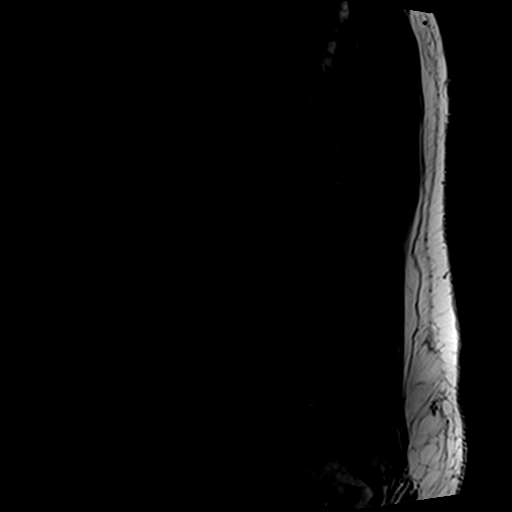
[im 15/15]
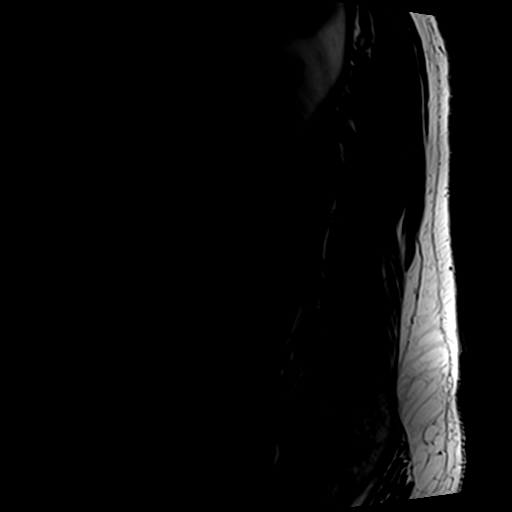

[Series 6: T2 · axial · 4.0mm · 0.70mm/px · z∈[-152,+37]mm · 9 of 37 slices shown (2 of 2)]
[im 1/37]
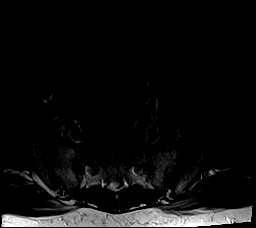
[im 6/37]
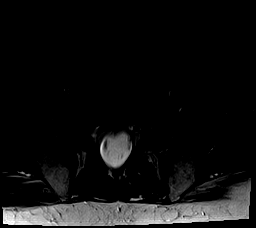
[im 11/37]
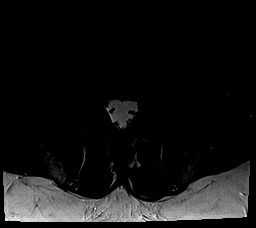
[im 16/37]
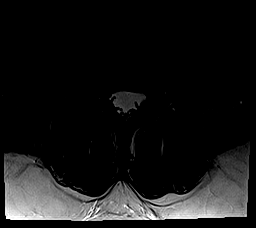
[im 19/37]
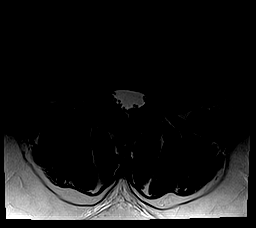
[im 21/37]
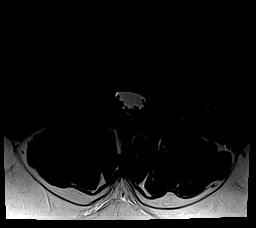
[im 26/37]
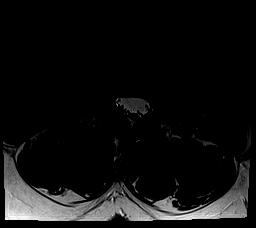
[im 31/37]
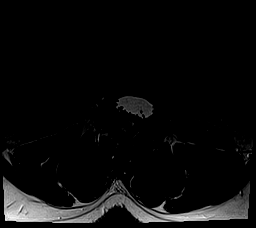
[im 37/37]
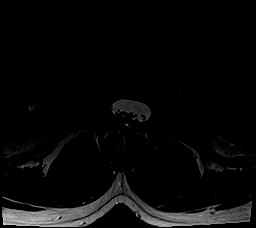

[Series 7: T1 · axial · 4.0mm · 0.35mm/px · z∈[-152,+5]mm · 4 of 37 slices shown (2 of 2)]
[im 1/37]
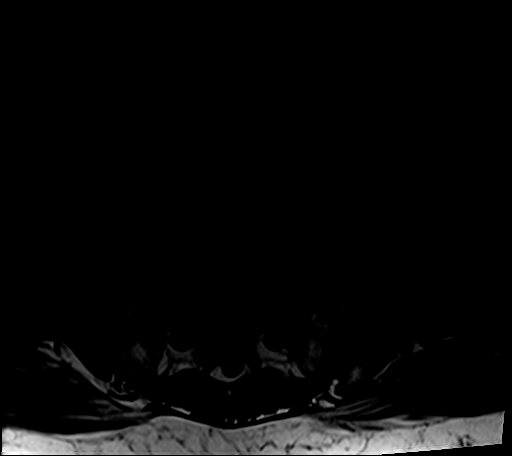
[im 6/37]
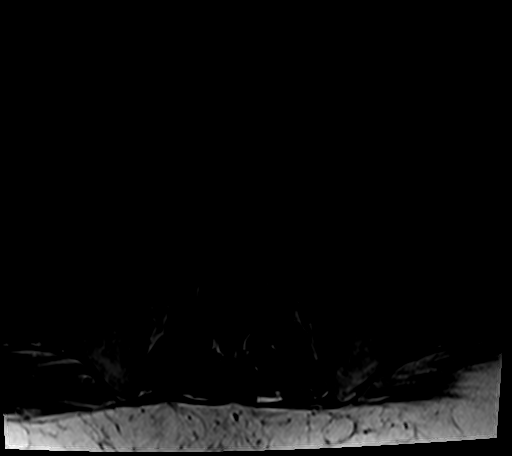
[im 19/37]
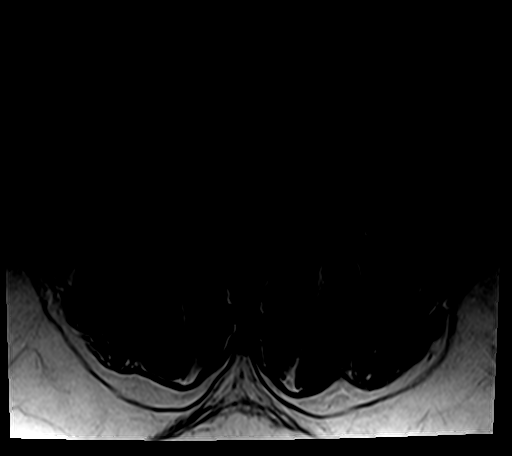
[im 31/37]
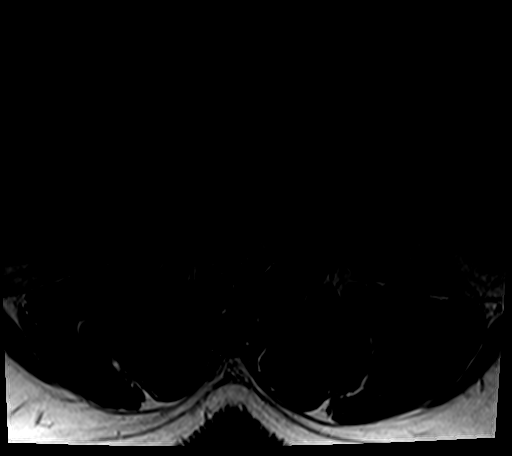

[25 of 48 positions shown; findings below may reference images not displayed]

FINDINGS: Lumbar vertebral bodies and posterior elements are intact and
aligned with maintenance of the lumbar lordosis. Using the reference
level of the last well-formed intervertebral disc as L5-S1, moderate
L2-3, mild to moderate L3-4 disc height loss associated with broad
upper lumbar levoscoliosis, and better seen on prior CT, considering
nonweightbearing examination. Decreased T2 signal within the mid
lumbar disc consistent with mild desiccation, mild to moderate
subacute discogenic endplate change at L2-3. Scattered chronic
Schmorl's nodes. No STIR signal abnormality to suggest acute osseous
process. T1 and T2 bright 16 mm hemangioma L1.

Conus medullaris terminates at L1 and appears normal in morphology
and signal characteristics. Cauda equina is unremarkable. Partially
imaged T2 bright 2 cm RIGHT parapelvic cysts. Paraspinal soft
tissues are normal.

Level by level evaluation:

L1-2: No disc bulge, canal stenosis nor neural foraminal narrowing.

L2-3: Annular bulging with small extra foraminal disc protrusion on
the LEFT, encroaching upon the exited LEFT L2 nerve. No canal
stenosis. No neural foraminal narrowing.

L3-4: Small broad-based disc bulge. No canal stenosis. Minimal
neural foraminal narrowing. Mild facet arthropathy and ligamentum
flavum redundancy.

L4-5: No disc bulge, canal stenosis nor neural foraminal narrowing.

L5-S1: No disc bulge, canal stenosis nor neural foraminal narrowing.
IMPRESSION: No acute fracture nor malalignment. Upper lumbar levoscoliosis on
this nonweightbearing examination.

No canal stenosis or neural foraminal narrowing.

Small extra foraminal L2-3 disc protrusion encroaches upon the
exited LEFT L2 nerve.

## 2017-09-03 ENCOUNTER — Other Ambulatory Visit: Payer: Self-pay | Admitting: Family Medicine

## 2017-12-30 ENCOUNTER — Telehealth: Payer: Self-pay | Admitting: Family Medicine

## 2017-12-30 ENCOUNTER — Other Ambulatory Visit (INDEPENDENT_AMBULATORY_CARE_PROVIDER_SITE_OTHER): Payer: PRIVATE HEALTH INSURANCE

## 2017-12-30 DIAGNOSIS — E119 Type 2 diabetes mellitus without complications: Secondary | ICD-10-CM

## 2017-12-30 DIAGNOSIS — E890 Postprocedural hypothyroidism: Secondary | ICD-10-CM

## 2017-12-30 DIAGNOSIS — E7800 Pure hypercholesterolemia, unspecified: Secondary | ICD-10-CM

## 2017-12-30 LAB — COMPREHENSIVE METABOLIC PANEL
ALK PHOS: 61 U/L (ref 39–117)
ALT: 16 U/L (ref 0–53)
AST: 18 U/L (ref 0–37)
Albumin: 4.5 g/dL (ref 3.5–5.2)
BUN: 22 mg/dL (ref 6–23)
CO2: 33 mEq/L — ABNORMAL HIGH (ref 19–32)
Calcium: 9.6 mg/dL (ref 8.4–10.5)
Chloride: 103 mEq/L (ref 96–112)
Creatinine, Ser: 1.19 mg/dL (ref 0.40–1.50)
GFR: 66.07 mL/min (ref >60.00)
GLUCOSE: 105 mg/dL — AB (ref 70–99)
POTASSIUM: 4.4 meq/L (ref 3.5–5.1)
Sodium: 141 mEq/L (ref 135–145)
TOTAL PROTEIN: 6.4 g/dL (ref 6.0–8.3)
Total Bilirubin: 0.8 mg/dL (ref 0.2–1.2)

## 2017-12-30 LAB — LIPID PANEL
Cholesterol: 173 mg/dL (ref 0–200)
HDL: 50.4 mg/dL (ref >39.00)
LDL Cholesterol: 103 mg/dL — ABNORMAL HIGH (ref 0–99)
NONHDL: 122.96
TRIGLYCERIDES: 99 mg/dL (ref 0.0–149.0)
Total CHOL/HDL Ratio: 3
VLDL: 19.8 mg/dL (ref 0.0–40.0)

## 2017-12-30 LAB — HEMOGLOBIN A1C: Hgb A1c MFr Bld: 6 % (ref 4.6–6.5)

## 2017-12-30 NOTE — Telephone Encounter (Signed)
-----   Message from Ellamae Sia sent at 12/20/2017  9:03 AM EDT ----- Regarding: Lab orders for Friday,11.8.19 Lab orders for f/u appt

## 2018-01-06 ENCOUNTER — Encounter: Payer: Self-pay | Admitting: Family Medicine

## 2018-01-06 ENCOUNTER — Ambulatory Visit: Payer: PRIVATE HEALTH INSURANCE | Admitting: Family Medicine

## 2018-01-06 VITALS — BP 124/70 | HR 62 | Temp 97.9°F | Ht 66.5 in | Wt 172.8 lb

## 2018-01-06 DIAGNOSIS — I10 Essential (primary) hypertension: Secondary | ICD-10-CM

## 2018-01-06 DIAGNOSIS — E7800 Pure hypercholesterolemia, unspecified: Secondary | ICD-10-CM

## 2018-01-06 DIAGNOSIS — G8929 Other chronic pain: Secondary | ICD-10-CM

## 2018-01-06 DIAGNOSIS — N401 Enlarged prostate with lower urinary tract symptoms: Secondary | ICD-10-CM

## 2018-01-06 DIAGNOSIS — R351 Nocturia: Secondary | ICD-10-CM

## 2018-01-06 DIAGNOSIS — E119 Type 2 diabetes mellitus without complications: Secondary | ICD-10-CM

## 2018-01-06 DIAGNOSIS — Z23 Encounter for immunization: Secondary | ICD-10-CM

## 2018-01-06 DIAGNOSIS — M542 Cervicalgia: Secondary | ICD-10-CM

## 2018-01-06 LAB — HM DIABETES FOOT EXAM

## 2018-01-06 MED ORDER — MELOXICAM 15 MG PO TABS: 15.0000 mg | ORAL_TABLET | Freq: Every day | ORAL | 1 refills | Status: DC

## 2018-01-06 MED ORDER — TAMSULOSIN HCL 0.4 MG PO CAPS: 0.8000 mg | ORAL_CAPSULE | Freq: Every day | ORAL | 11 refills | Status: DC

## 2018-01-06 NOTE — Assessment & Plan Note (Signed)
INCrease pravastatin to 30 mg daily.. recheck in 6 moths.

## 2018-01-06 NOTE — Assessment & Plan Note (Signed)
Low dose fosamax not helpful.. Try increase to 0.8 mg daily.

## 2018-01-06 NOTE — Patient Instructions (Addendum)
Stop ibuprofen.  Start meloxicam  15 mg  As needed for neck pain.. Try to limit as much as able.  Can try 1/2 tablet of amlodipine.. Follow BP and see if less dizziness on standing.  Try an increased dose of pravastatin up to 30 mg daily.

## 2018-01-06 NOTE — Assessment & Plan Note (Signed)
Will try a trial of meloxicam.. Discuss potential increase risk of CAD.Marland Kitchen Pt agreeable to try and accept risk.

## 2018-01-06 NOTE — Assessment & Plan Note (Signed)
Good control with diet.

## 2018-01-06 NOTE — Progress Notes (Signed)
Established Patient Office Visit  Subjective:  Patient ID: Brett Lowery, male    DOB: 12-28-1955  Age: 62 y.o. MRN: [id number]  CC:  Chief Complaint  Patient presents with  . Follow-up    106mo  . Neck Pain    arthritis    HPI Brett Lowery presents for 6 month follow up DM  Diabetes:   Well controlled on no medication. Lab Results  Component Value Date   HGBA1C 6.0 12/30/2017  Using medications without difficulties: Hypoglycemic episodes: Hyperglycemic episodes: Feet problems: no ulcer Blood Sugars averaging: not checking eye exam within last year: yesterday  Elevated Cholesterol:  Cholesterol  Goal < 70 given CAD.  LDL not at goal on pravastatin (taking 20 mg daily) Lab Results  Component Value Date   CHOL 173 12/30/2017   HDL 50.40 12/30/2017   LDLCALC 103 (H) 12/30/2017   TRIG 99.0 12/30/2017   CHOLHDL 3 12/30/2017  Using medications without problems: Muscle aches:  Diet compliance: Good Exercise: walking Other complaints: Body mass index is 27.46 kg/m.   Hypertension:  Good control on amlodipine.  BP Readings from Last 3 Encounters:  01/06/18 124/70  07/05/17 110/62  01/18/17 109/79  Using medication without problems or lightheadedness:  Yes.. With standing still.. Even on only one BP med. Chest pain with exertion: none Edema: none Short of breath: stable sob Average home BPs: Other issues:  He has an additional acute concern of neck pain... He has history of chronic neck pain ( OA) in past, but it has been worsening in last 6-9 months.  Seeing chiropractor helps some.  Neck is stiff, sore.. Cracking and popping.. No radidation pain down arms, no numbnes or weakness in arms.  Using ibuprofen   600 mg daily now.. Does not help much.  He is interested in meloxicam for pain.  2014: X-ray cervical: IMPRESSION: Multilevel osteoarthritic change.  No fracture or spondylolisthesis.  Small cervical ribs bilaterally.    Nocturia and decreased flow...  No benefit in flomax after 6 months.    Past Medical History:  Diagnosis Date  . CAD (coronary artery disease)   . Chest pain   . Disorders of bursae and tendons in shoulder region, unspecified    R, tendonitis  . DM (diabetes mellitus) ([place])    w/o complications  . Dyslipidemia   . GERD (gastroesophageal reflux disease)   . HTN (hypertension)   . Hypercholesterolemia   . Hypothyroidism   . Insomnia   . Serrated adenoma of colon 2009  . Special screening for malignant neoplasm of prostate   . Special screening for malignant neoplasms, colon   . Well adult exam     Past Surgical History:  Procedure Laterality Date  . CARDIAC CATHETERIZATION  2009   shoed no obstructive CAD, medical management   . CLOSED MANIPULATION SHOULDER WITH STERIOD INJECTION Left 01/18/2017   Procedure: CLOSED MANIPULATION SHOULDER WITH STEROID INJECTION;  Surgeon: Renette Butters, MD;  Location: [facility];  Service: Orthopedics;  Laterality: Left;  . cornia transplants  2003  . DG ESOPHAGUS -BA SW  2005   mod reflux and hiatal hernia  . HERNIA REPAIR  2002  . THYROIDECTOMY  2004   thyroid nodile    Family History  Problem Relation Age of Onset  . Heart attack Neg Hx        < age 40  . Colon cancer Neg Hx   . Esophageal cancer Neg Hx   . Rectal cancer Neg Hx   .  Stomach cancer Neg Hx     Social History   Socioeconomic History  . Marital status: Married    Spouse name: Not on file  . Number of children: Not on file  . Years of education: Not on file  . Highest education level: Not on file  Occupational History  . Not on file  Social Needs  . Financial resource strain: Not on file  . Food insecurity:    Worry: Not on file    Inability: Not on file  . Transportation needs:    Medical: Not on file    Non-medical: Not on file  Tobacco Use  . Smoking status: Former Smoker    Last attempt to quit: 03/28/2001    Years since quitting: 16.7  . Smokeless tobacco: Never  Used  . Tobacco comment: 40 pack year hx   Substance and Sexual Activity  . Alcohol use: No  . Drug use: No  . Sexual activity: Not on file  Lifestyle  . Physical activity:    Days per week: Not on file    Minutes per session: Not on file  . Stress: Not on file  Relationships  . Social connections:    Talks on phone: Not on file    Gets together: Not on file    Attends religious service: Not on file    Active member of club or organization: Not on file    Attends meetings of clubs or organizations: Not on file    Relationship status: Not on file  . Intimate partner violence:    Fear of current or ex partner: Not on file    Emotionally abused: Not on file    Physically abused: Not on file    Forced sexual activity: Not on file  Other Topics Concern  . Not on file  Social History Narrative   Married x30 years; 2 daughters 5-23   Occupation: heating and Personal assistant. Does not get regular exercise.   Diet: fruit and veggies. Occ. Fast food on weekends.       designated party release form signed authorizing Mardene Celeste C. Russom (wife) [phone]. Allen Norris 12/12/09.     Outpatient Medications Prior to Visit  Medication Sig Dispense Refill  . amLODipine (NORVASC) 5 MG tablet TAKE 1 TABLET(5 MG) BY MOUTH DAILY 90 tablet 1  . aspirin 81 MG tablet Take 81 mg by mouth daily.      Marland Kitchen DEXILANT 60 MG capsule TAKE 1 CAPSULE(60 MG) BY MOUTH DAILY 90 capsule 1  . fluticasone (FLONASE) 50 MCG/ACT nasal spray SHAKE LIQUID AND USE 2 SPRAYS IN EACH NOSTRIL EVERY DAY 16 g 11  . levothyroxine (SYNTHROID, LEVOTHROID) 125 MCG tablet TAKE 1 TABLET(125 MCG) BY MOUTH DAILY BEFORE BREAKFAST 90 tablet 1  . pravastatin (PRAVACHOL) 10 MG tablet TAKE 1 TO 2 TABLETS(10 TO 20 MG) BY MOUTH DAILY 180 tablet 3  . tamsulosin (FLOMAX) 0.4 MG CAPS capsule Take 1 capsule (0.4 mg total) by mouth daily. 30 capsule 11   No facility-administered medications prior to visit.     No Known Allergies  ROS Review of  Systems    Objective:    Physical Exam  BP 124/70   Pulse 62   Temp 97.9 F (36.6 C) (Oral)   Ht 5' 6.5" (1.689 m)   Wt 172 lb 12 oz (78.4 kg)   SpO2 97%   BMI 27.46 kg/m  Wt Readings from Last 3 Encounters:  01/06/18 172 lb 12 oz (78.4 kg)  07/05/17 173 lb 4 oz (78.6 kg)  01/18/17 178 lb (80.7 kg)   Diabetic foot exam: Normal inspection No skin breakdown No calluses  Normal DP pulses Normal sensation to light touch and monofilament Nails normal   Health Maintenance Due  Topic Date Due  . PNEUMOCOCCAL POLYSACCHARIDE VACCINE AGE 17-64 HIGH RISK  01/20/1958  . OPHTHALMOLOGY EXAM  10/12/2016  . INFLUENZA VACCINE  09/22/2017  . FOOT EXAM  12/07/2017    There are no preventive care reminders to display for this patient.  Lab Results  Component Value Date   TSH 0.39 06/28/2017   Lab Results  Component Value Date   WBC 11.7 (H) 07/07/2016   HGB 14.4 07/07/2016   HCT 42.2 07/07/2016   MCV 84.4 07/07/2016   PLT 250 07/07/2016   Lab Results  Component Value Date   NA 141 12/30/2017   K 4.4 12/30/2017   CO2 33 (H) 12/30/2017   GLUCOSE 105 (H) 12/30/2017   BUN 22 12/30/2017   CREATININE 1.19 12/30/2017   BILITOT 0.8 12/30/2017   ALKPHOS 61 12/30/2017   AST 18 12/30/2017   ALT 16 12/30/2017   PROT 6.4 12/30/2017   ALBUMIN 4.5 12/30/2017   CALCIUM 9.6 12/30/2017   ANIONGAP 10 07/07/2016   GFR 66.07 12/30/2017   Lab Results  Component Value Date   CHOL 173 12/30/2017   Lab Results  Component Value Date   HDL 50.40 12/30/2017   Lab Results  Component Value Date   LDLCALC 103 (H) 12/30/2017   Lab Results  Component Value Date   TRIG 99.0 12/30/2017   Lab Results  Component Value Date   CHOLHDL 3 12/30/2017   Lab Results  Component Value Date   HGBA1C 6.0 12/30/2017      Assessment & Plan:

## 2018-01-06 NOTE — Assessment & Plan Note (Signed)
Still some orthostatic symptoms.. Can try 1/2 tab of amlodipine but watch BPs.

## 2018-01-10 ENCOUNTER — Encounter: Payer: Self-pay | Admitting: Family Medicine

## 2018-03-02 ENCOUNTER — Other Ambulatory Visit: Payer: Self-pay | Admitting: Family Medicine

## 2018-03-21 ENCOUNTER — Other Ambulatory Visit: Payer: Self-pay | Admitting: Family Medicine

## 2018-03-21 NOTE — Telephone Encounter (Signed)
Last office visit 01/06/2018 for DM.  Last refilled 01/06/2018 for #30 with 1 refill.  CPE scheduled 07/07/2018.

## 2018-05-03 LAB — HM DIABETES EYE EXAM

## 2018-05-30 ENCOUNTER — Other Ambulatory Visit: Payer: Self-pay | Admitting: Family Medicine

## 2018-05-30 NOTE — Telephone Encounter (Signed)
Last office visit 01/06/2018 for DM.  Last refilled 03/21/2018 for #30 with 1 refill.   Next Appt: 07/07/2018 for CPE.

## 2018-06-27 ENCOUNTER — Telehealth: Payer: Self-pay | Admitting: Family Medicine

## 2018-06-27 MED ORDER — LEVOTHYROXINE SODIUM 125 MCG PO TABS: 125.0000 ug | ORAL_TABLET | Freq: Every day | ORAL | 0 refills | Status: DC

## 2018-06-27 MED ORDER — AMLODIPINE BESYLATE 5 MG PO TABS: ORAL_TABLET | ORAL | 0 refills | Status: DC

## 2018-06-27 MED ORDER — PRAVASTATIN SODIUM 10 MG PO TABS: ORAL_TABLET | ORAL | 1 refills | Status: DC

## 2018-06-27 MED ORDER — FLUTICASONE PROPIONATE 50 MCG/ACT NA SUSP: NASAL | 2 refills | Status: DC

## 2018-06-27 MED ORDER — DEXLANSOPRAZOLE 60 MG PO CPDR: DELAYED_RELEASE_CAPSULE | ORAL | 0 refills | Status: DC

## 2018-06-27 NOTE — Telephone Encounter (Signed)
FYI  Best number [phone] Pt r/s his cpx from 5/15 to 7/14  Needs refill on all of his meds.  He didn't have the names of them  He stated he would call the pharmacy to have them send refill request  Pt has [employer]  Coverage will depend on patients plan for virtual appointment

## 2018-06-27 NOTE — Telephone Encounter (Signed)
I have sent refills on Amlodipine, Dexilant, Flonase, Pravastatin and Levothyroxine to [facility] in [place].

## 2018-06-30 ENCOUNTER — Other Ambulatory Visit: Payer: Self-pay | Admitting: Family Medicine

## 2018-06-30 ENCOUNTER — Other Ambulatory Visit: Payer: Self-pay

## 2018-07-07 ENCOUNTER — Encounter: Payer: Self-pay | Admitting: Family Medicine

## 2018-07-30 ENCOUNTER — Other Ambulatory Visit: Payer: Self-pay | Admitting: Family Medicine

## 2018-08-21 ENCOUNTER — Other Ambulatory Visit: Payer: Self-pay | Admitting: Family Medicine

## 2018-08-22 NOTE — Telephone Encounter (Signed)
Last office visit 11/158/2019 for DM.  Last refilled 05/30/2018 for #30 with 1 refill.  CPE scheduled for 09/05/2018.

## 2018-08-29 ENCOUNTER — Other Ambulatory Visit: Payer: Self-pay | Admitting: Family Medicine

## 2018-08-31 ENCOUNTER — Telehealth: Payer: Self-pay | Admitting: Family Medicine

## 2018-08-31 DIAGNOSIS — E890 Postprocedural hypothyroidism: Secondary | ICD-10-CM

## 2018-08-31 DIAGNOSIS — Z125 Encounter for screening for malignant neoplasm of prostate: Secondary | ICD-10-CM

## 2018-08-31 DIAGNOSIS — E119 Type 2 diabetes mellitus without complications: Secondary | ICD-10-CM

## 2018-08-31 DIAGNOSIS — E7800 Pure hypercholesterolemia, unspecified: Secondary | ICD-10-CM

## 2018-08-31 NOTE — Telephone Encounter (Signed)
-----   Message from Ellamae Sia sent at 08/28/2018  3:41 PM EDT ----- Regarding: Lab orders for Friday, 7.10.20 Patient is scheduled for CPX labs, please order future labs, Thanks , Karna Christmas

## 2018-09-01 ENCOUNTER — Other Ambulatory Visit (INDEPENDENT_AMBULATORY_CARE_PROVIDER_SITE_OTHER): Payer: PRIVATE HEALTH INSURANCE

## 2018-09-01 ENCOUNTER — Other Ambulatory Visit: Payer: Self-pay

## 2018-09-01 DIAGNOSIS — Z125 Encounter for screening for malignant neoplasm of prostate: Secondary | ICD-10-CM

## 2018-09-01 DIAGNOSIS — E119 Type 2 diabetes mellitus without complications: Secondary | ICD-10-CM

## 2018-09-01 DIAGNOSIS — E7800 Pure hypercholesterolemia, unspecified: Secondary | ICD-10-CM

## 2018-09-01 DIAGNOSIS — E890 Postprocedural hypothyroidism: Secondary | ICD-10-CM

## 2018-09-01 DIAGNOSIS — H833X3 Noise effects on inner ear, bilateral: Secondary | ICD-10-CM | POA: Insufficient documentation

## 2018-09-01 DIAGNOSIS — J342 Deviated nasal septum: Secondary | ICD-10-CM | POA: Insufficient documentation

## 2018-09-01 LAB — COMPREHENSIVE METABOLIC PANEL
ALT: 16 U/L (ref 0–53)
AST: 17 U/L (ref 0–37)
Albumin: 4.4 g/dL (ref 3.5–5.2)
Alkaline Phosphatase: 61 U/L (ref 39–117)
BUN: 25 mg/dL — ABNORMAL HIGH (ref 6–23)
CO2: 29 mEq/L (ref 19–32)
Calcium: 9.2 mg/dL (ref 8.4–10.5)
Chloride: 107 mEq/L (ref 96–112)
Creatinine, Ser: 1 mg/dL (ref 0.40–1.50)
GFR: 75.81 mL/min (ref >60.00)
Glucose, Bld: 89 mg/dL (ref 70–99)
Potassium: 4.1 mEq/L (ref 3.5–5.1)
Sodium: 143 mEq/L (ref 135–145)
Total Bilirubin: 1.1 mg/dL (ref 0.2–1.2)
Total Protein: 6.1 g/dL (ref 6.0–8.3)

## 2018-09-01 LAB — HEMOGLOBIN A1C: Hgb A1c MFr Bld: 5.9 % (ref 4.6–6.5)

## 2018-09-01 LAB — MICROALBUMIN / CREATININE URINE RATIO
Creatinine,U: 177.8 mg/dL
Microalb Creat Ratio: 0.6 mg/g (ref 0.0–30.0)
Microalb, Ur: 1.1 mg/dL (ref 0.0–1.9)

## 2018-09-01 LAB — T3, FREE: T3, Free: 3.3 pg/mL (ref 2.3–4.2)

## 2018-09-01 LAB — LIPID PANEL
Cholesterol: 163 mg/dL (ref 0–200)
HDL: 44.5 mg/dL (ref >39.00)
LDL Cholesterol: 102 mg/dL — ABNORMAL HIGH (ref 0–99)
NonHDL: 118.11
Total CHOL/HDL Ratio: 4
Triglycerides: 79 mg/dL (ref 0.0–149.0)
VLDL: 15.8 mg/dL (ref 0.0–40.0)

## 2018-09-01 LAB — T4, FREE: Free T4: 1.15 ng/dL (ref 0.60–1.60)

## 2018-09-01 LAB — PSA, MEDICARE: PSA: 1.77 ng/ml (ref 0.10–4.00)

## 2018-09-01 LAB — TSH: TSH: 0.05 u[IU]/mL — ABNORMAL LOW (ref 0.35–4.50)

## 2018-09-05 ENCOUNTER — Other Ambulatory Visit: Payer: Self-pay

## 2018-09-05 ENCOUNTER — Ambulatory Visit (INDEPENDENT_AMBULATORY_CARE_PROVIDER_SITE_OTHER): Payer: PRIVATE HEALTH INSURANCE | Admitting: Family Medicine

## 2018-09-05 ENCOUNTER — Encounter: Payer: Self-pay | Admitting: Family Medicine

## 2018-09-05 VITALS — BP 130/70 | HR 62 | Temp 98.0°F | Ht 66.5 in | Wt 179.5 lb

## 2018-09-05 DIAGNOSIS — I10 Essential (primary) hypertension: Secondary | ICD-10-CM

## 2018-09-05 DIAGNOSIS — Z0000 Encounter for general adult medical examination without abnormal findings: Secondary | ICD-10-CM | POA: Diagnosis not present

## 2018-09-05 DIAGNOSIS — E119 Type 2 diabetes mellitus without complications: Secondary | ICD-10-CM | POA: Diagnosis not present

## 2018-09-05 DIAGNOSIS — E890 Postprocedural hypothyroidism: Secondary | ICD-10-CM

## 2018-09-05 DIAGNOSIS — E7800 Pure hypercholesterolemia, unspecified: Secondary | ICD-10-CM

## 2018-09-05 LAB — HM DIABETES FOOT EXAM

## 2018-09-05 NOTE — Progress Notes (Signed)
Chief Complaint  Patient presents with  . Annual Exam    CPE    History of Present Illness: HPI  The patient is here for annual wellness exam and preventative care.    Diabetes:   Excellent control on diet control. Lab Results  Component Value Date   HGBA1C 5.9 09/01/2018  Using medications without difficulties: Hypoglycemic episodes: Hyperglycemic episodes: Feet problems: no ulcer Blood Sugars averaging: not checking eye exam within last year:  Hypertension:    Stable control BP Readings from Last 3 Encounters:  09/05/18 130/70  01/06/18 124/70  07/05/17 110/62  Using medication without problems or lightheadedness:  none Chest pain with exertion:none Edema:none Short of breath: stable with exertion.. has been evaluated. Average home BPs: Other issues:  Elevated Cholesterol:  LDL almost at goal  < 70 on pravastatin 20 mg daily.. cannot tolerate crestor or higher dose. Lab Results  Component Value Date   CHOL 163 09/01/2018   HDL 44.50 09/01/2018   LDLCALC 102 (H) 09/01/2018   TRIG 79.0 09/01/2018   CHOLHDL 4 09/01/2018  Using medications without problems: Muscle aches:  Diet compliance: moderate Exercise:walking Other complaints:HX of CAD Wt Readings from Last 3 Encounters:  09/05/18 179 lb 8 oz (81.4 kg)  01/06/18 172 lb 12 oz (78.4 kg)  07/05/17 173 lb 4 oz (78.6 kg)    Hypothyroid  T3 and t4 stabel on current dose levothyroxine. Lab Results  Component Value Date   TSH 0.05 (L) 09/01/2018       Office Visit from 09/05/2018 in [place] at [facility] Total Score  0       COVID 19 screen No recent travel or known exposure to COVID19 The patient denies respiratory symptoms of COVID 19 at this time.  The importance of social distancing was discussed today.   ROS    Past Medical History:  Diagnosis Date  . CAD (coronary artery disease)   . Chest pain   . Disorders of bursae and tendons in shoulder region, unspecified    R,  tendonitis  . DM (diabetes mellitus) ([place])    w/o complications  . Dyslipidemia   . GERD (gastroesophageal reflux disease)   . HTN (hypertension)   . Hypercholesterolemia   . Hypothyroidism   . Insomnia   . Serrated adenoma of colon 2009  . Special screening for malignant neoplasm of prostate   . Special screening for malignant neoplasms, colon   . Well adult exam     reports that he quit smoking about 17 years ago. He has never used smokeless tobacco. He reports that he does not drink alcohol or use drugs.   Current Outpatient Medications:  .  amLODipine (NORVASC) 5 MG tablet, TAKE 1 TABLET(5 MG) BY MOUTH DAILY, Disp: 90 tablet, Rfl: 0 .  aspirin 81 MG tablet, Take 81 mg by mouth daily.  , Disp: , Rfl:  .  dexlansoprazole (DEXILANT) 60 MG capsule, TAKE 1 CAPSULE(60 MG) BY MOUTH DAILY, Disp: 90 capsule, Rfl: 0 .  fluticasone (FLONASE) 50 MCG/ACT nasal spray, SHAKE LIQUID AND USE 2 SPRAYS IN EACH NOSTRIL EVERY DAY, Disp: 16 g, Rfl: 2 .  levothyroxine (SYNTHROID) 125 MCG tablet, TAKE 1 TABLET(125 MCG) BY MOUTH DAILY BEFORE BREAKFAST, Disp: 90 tablet, Rfl: 0 .  meloxicam (MOBIC) 15 MG tablet, TAKE 1 TABLET(15 MG) BY MOUTH DAILY, Disp: 30 tablet, Rfl: 1 .  pravastatin (PRAVACHOL) 10 MG tablet, TAKE 1 TO 2 TABLETS(10 TO 20 MG) BY MOUTH DAILY, Disp: 180  tablet, Rfl: 1 .  tamsulosin (FLOMAX) 0.4 MG CAPS capsule, Take 2 capsules (0.8 mg total) by mouth daily., Disp: 60 capsule, Rfl: 11   Observations/Objective: Blood pressure 130/70, pulse 62, temperature 98 F (36.7 C), temperature source Temporal, height 5' 6.5" (1.689 m), weight 179 lb 8 oz (81.4 kg), SpO2 96 %.  Physical Exam Constitutional:      General: He is not in acute distress.    Appearance: Normal appearance. He is well-developed. He is not ill-appearing or toxic-appearing.  HENT:     Head: Normocephalic and atraumatic.     Right Ear: Hearing, tympanic membrane, ear canal and external ear normal.     Left Ear: Hearing,  tympanic membrane, ear canal and external ear normal.     Nose: Nose normal.     Mouth/Throat:     Pharynx: Uvula midline.  Eyes:     General: Lids are normal. Lids are everted, no foreign bodies appreciated.     Conjunctiva/sclera: Conjunctivae normal.     Pupils: Pupils are equal, round, and reactive to light.  Neck:     Musculoskeletal: Normal range of motion and neck supple.     Thyroid: No thyroid mass or thyromegaly.     Vascular: No carotid bruit.     Trachea: Trachea and phonation normal.  Cardiovascular:     Rate and Rhythm: Normal rate and regular rhythm.     Pulses: Normal pulses.     Heart sounds: S1 normal and S2 normal. No murmur. No gallop.   Pulmonary:     Breath sounds: Normal breath sounds. No wheezing, rhonchi or rales.  Abdominal:     General: Bowel sounds are normal.     Palpations: Abdomen is soft.     Tenderness: There is no abdominal tenderness. There is no guarding or rebound.     Hernia: No hernia is present.  Lymphadenopathy:     Cervical: No cervical adenopathy.  Skin:    General: Skin is warm and dry.     Findings: No rash.  Neurological:     Mental Status: He is alert.     Cranial Nerves: No cranial nerve deficit.     Sensory: No sensory deficit.     Gait: Gait normal.     Deep Tendon Reflexes: Reflexes are normal and symmetric.  Psychiatric:        Speech: Speech normal.        Behavior: Behavior normal.        Judgment: Judgment normal.      Diabetic foot exam: Normal inspection No skin breakdown No calluses  Normal DP pulses Normal sensation to light touch and monofilament Nails normal  Assessment and Plan The patient's preventative maintenance and recommended screening tests for an annual wellness exam were reviewed in full today. Brought up to date unless services declined.  Counselled on the importance of diet, exercise, and its role in overall health and mortality. The patient's FH and SH was reviewed, including their home  life, tobacco status, and drug and alcohol status.      Vaccines: Due for PNA and consider shingles. Prostate Cancer Screen:  Stable control. Lab Results  Component Value Date   PSA 1.77 09/01/2018   PSA 2.08 06/28/2017   PSA 1.86 05/26/2016   Colon cancer Screen: 06/2014, Dr. 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   Essential hypertension, benign Well controlled.  Continue current medication.   Diabetes mellitus with no complication Excellent control on diet control.  Hypothyroidism Well controlled. Continue current medication.      Eliezer Lofts, MD

## 2018-09-05 NOTE — Assessment & Plan Note (Signed)
Excellent control on diet control.

## 2018-09-05 NOTE — Assessment & Plan Note (Signed)
Well controlled. Continue current medication.  

## 2018-09-05 NOTE — Patient Instructions (Signed)
Continue working on healthy eating and regular exercise!

## 2018-09-18 ENCOUNTER — Encounter: Payer: Self-pay | Admitting: Family Medicine

## 2018-09-28 ENCOUNTER — Other Ambulatory Visit: Payer: Self-pay | Admitting: Family Medicine

## 2018-10-24 ENCOUNTER — Other Ambulatory Visit: Payer: Self-pay | Admitting: Family Medicine

## 2018-10-24 NOTE — Telephone Encounter (Signed)
Last office visit 09/05/2018 for CPE.  Last refilled 08/22/2018 for #30 with 1 refill.  Next Appt: 03/13/2019 for 6 month follow up.

## 2018-11-27 ENCOUNTER — Other Ambulatory Visit: Payer: Self-pay | Admitting: *Deleted

## 2018-11-27 MED ORDER — AMLODIPINE BESYLATE 5 MG PO TABS: 5.0000 mg | ORAL_TABLET | Freq: Every day | ORAL | 1 refills | Status: DC

## 2018-11-27 MED ORDER — LEVOTHYROXINE SODIUM 125 MCG PO TABS: ORAL_TABLET | ORAL | 3 refills | Status: DC

## 2018-12-24 ENCOUNTER — Other Ambulatory Visit: Payer: Self-pay | Admitting: Family Medicine

## 2018-12-25 NOTE — Telephone Encounter (Signed)
Last office visit 09/05/2018 for CPE.  Last refilled 10/24/2018 for #30 with 1 refill.  Next Appt: 03/13/2019 for 6 month follow up.

## 2018-12-27 ENCOUNTER — Other Ambulatory Visit: Payer: Self-pay | Admitting: Family Medicine

## 2018-12-27 ENCOUNTER — Other Ambulatory Visit: Payer: Self-pay | Admitting: *Deleted

## 2018-12-27 MED ORDER — PRAVASTATIN SODIUM 20 MG PO TABS: 20.0000 mg | ORAL_TABLET | Freq: Every day | ORAL | 2 refills | Status: DC

## 2018-12-27 MED ORDER — TAMSULOSIN HCL 0.4 MG PO CAPS: 0.8000 mg | ORAL_CAPSULE | Freq: Every day | ORAL | 5 refills | Status: DC

## 2019-02-23 ENCOUNTER — Other Ambulatory Visit: Payer: Self-pay | Admitting: Family Medicine

## 2019-02-25 ENCOUNTER — Other Ambulatory Visit: Payer: Self-pay | Admitting: Family Medicine

## 2019-02-26 NOTE — Telephone Encounter (Signed)
Last office visit 09/05/2018 for CPE.  Last refilled 12/25/2018 for #30 with 1 refill.  Next Appt: 03/13/2019 for 6 month follow up.

## 2019-03-09 ENCOUNTER — Other Ambulatory Visit (INDEPENDENT_AMBULATORY_CARE_PROVIDER_SITE_OTHER): Payer: PRIVATE HEALTH INSURANCE

## 2019-03-09 ENCOUNTER — Telehealth: Payer: Self-pay | Admitting: Family Medicine

## 2019-03-09 ENCOUNTER — Other Ambulatory Visit: Payer: Self-pay

## 2019-03-09 DIAGNOSIS — E119 Type 2 diabetes mellitus without complications: Secondary | ICD-10-CM

## 2019-03-09 DIAGNOSIS — E890 Postprocedural hypothyroidism: Secondary | ICD-10-CM

## 2019-03-09 DIAGNOSIS — M5030 Other cervical disc degeneration, unspecified cervical region: Secondary | ICD-10-CM

## 2019-03-09 LAB — COMPREHENSIVE METABOLIC PANEL
ALT: 15 U/L (ref 0–53)
AST: 19 U/L (ref 0–37)
Albumin: 4.5 g/dL (ref 3.5–5.2)
Alkaline Phosphatase: 66 U/L (ref 39–117)
BUN: 26 mg/dL — ABNORMAL HIGH (ref 6–23)
CO2: 30 mEq/L (ref 19–32)
Calcium: 9.6 mg/dL (ref 8.4–10.5)
Chloride: 104 mEq/L (ref 96–112)
Creatinine, Ser: 1.07 mg/dL (ref 0.40–1.50)
GFR: 70 mL/min (ref >60.00)
Glucose, Bld: 102 mg/dL — ABNORMAL HIGH (ref 70–99)
Potassium: 4.4 mEq/L (ref 3.5–5.1)
Sodium: 141 mEq/L (ref 135–145)
Total Bilirubin: 1.1 mg/dL (ref 0.2–1.2)
Total Protein: 6.5 g/dL (ref 6.0–8.3)

## 2019-03-09 LAB — LIPID PANEL
Cholesterol: 188 mg/dL (ref 0–200)
HDL: 51.5 mg/dL (ref >39.00)
LDL Cholesterol: 119 mg/dL — ABNORMAL HIGH (ref 0–99)
NonHDL: 136.64
Total CHOL/HDL Ratio: 4
Triglycerides: 88 mg/dL (ref 0.0–149.0)
VLDL: 17.6 mg/dL (ref 0.0–40.0)

## 2019-03-09 LAB — HEMOGLOBIN A1C: Hgb A1c MFr Bld: 6 % (ref 4.6–6.5)

## 2019-03-09 NOTE — Progress Notes (Signed)
No critical labs need to be addressed urgently. We will discuss labs in detail at upcoming office visit.   

## 2019-03-09 NOTE — Telephone Encounter (Signed)
-----   Message from Ellamae Sia sent at 02/28/2019  4:07 PM EST ----- Regarding: Lab orders for Friday, 1.15.21 Lab orders for a 6 month follow up appt

## 2019-03-13 ENCOUNTER — Encounter: Payer: Self-pay | Admitting: Family Medicine

## 2019-03-13 ENCOUNTER — Ambulatory Visit: Payer: PRIVATE HEALTH INSURANCE | Admitting: Family Medicine

## 2019-03-13 ENCOUNTER — Other Ambulatory Visit: Payer: Self-pay

## 2019-03-13 VITALS — BP 140/88 | HR 70 | Temp 97.9°F | Ht 66.5 in | Wt 186.2 lb

## 2019-03-13 DIAGNOSIS — E7800 Pure hypercholesterolemia, unspecified: Secondary | ICD-10-CM | POA: Diagnosis not present

## 2019-03-13 DIAGNOSIS — R0789 Other chest pain: Secondary | ICD-10-CM

## 2019-03-13 DIAGNOSIS — Z23 Encounter for immunization: Secondary | ICD-10-CM

## 2019-03-13 DIAGNOSIS — E119 Type 2 diabetes mellitus without complications: Secondary | ICD-10-CM

## 2019-03-13 DIAGNOSIS — I10 Essential (primary) hypertension: Secondary | ICD-10-CM | POA: Diagnosis not present

## 2019-03-13 DIAGNOSIS — K219 Gastro-esophageal reflux disease without esophagitis: Secondary | ICD-10-CM

## 2019-03-13 MED ORDER — EZETIMIBE 10 MG PO TABS: 10.0000 mg | ORAL_TABLET | Freq: Every day | ORAL | 11 refills | Status: DC

## 2019-03-13 MED ORDER — OLOPATADINE HCL 0.1 % OP SOLN: 1.0000 [drp] | Freq: Two times a day (BID) | OPHTHALMIC | 0 refills | Status: DC

## 2019-03-13 MED ORDER — PANTOPRAZOLE SODIUM 40 MG PO TBEC: 40.0000 mg | DELAYED_RELEASE_TABLET | Freq: Every day | ORAL | 3 refills | Status: DC

## 2019-03-13 NOTE — Assessment & Plan Note (Signed)
Not at goal. Does not tolerate higher dose of pravastatin or crestor.  Will try adding Zetia... re-eval chol in 3-6 months.

## 2019-03-13 NOTE — Assessment & Plan Note (Signed)
Borderline control. 

## 2019-03-13 NOTE — Progress Notes (Signed)
Chief Complaint  Patient presents with  . Diabetes    History of Present Illness: HPI    64 year old male presents for 6 month follow up DM, HTN, CAD, high cholesterol.  Diabetes:   Excellent control with diet control.  Lab Results  Component Value Date   HGBA1C 6.0 03/09/2019  Using medications without difficulties: Hypoglycemic episodes: Hyperglycemic episodes: Feet problems: no ulcers Blood Sugars averaging: eye exam within last year: scheduled next month  Elevated Cholesterol:  LLD not at goal on pravastatin.. cannot tolerate higher dose or crestor per pt report. Per review.. has never tried zetia in addition. Lab Results  Component Value Date   CHOL 188 03/09/2019   HDL 51.50 03/09/2019   LDLCALC 119 (H) 03/09/2019   TRIG 88.0 03/09/2019   CHOLHDL 4 03/09/2019  Using medications without problems: Muscle aches:  Diet compliance: moderate Exercise: walking Other complaints: Hx of CAD  Hypertension:  Borderline control in office today on amlodipine   BP Readings from Last 3 Encounters:  03/13/19 140/88  09/05/18 130/70  01/06/18 124/70  Using medication without problems or lightheadedness:  Chest pain with exertion:  chronic chest pain at rest off and on..  Recently tightness in left chest seems some worse in las month... occ tinglin on inside of left arm. Nothing makes it better or worse.Marland Kitchen occ waking up at night. Have daily heartburn.. worse with greasy food. He is using  dexilant  Daily... much worse if not taking it. Feels he has gotten used to [facility]. Last ENDO.Marland Kitchen 3-4 years ago. Only occ using meloxicam. Edema:none Short of breath: stable  Average home BPs: 130-140/80-95 Other issues:     This visit occurred during the SARS-CoV-2 public health emergency.  Safety protocols were in place, including screening questions prior to the visit, additional usage of staff PPE, and extensive cleaning of exam room while observing appropriate contact time as indicated  for disinfecting solutions.   COVID 19 screen:  No recent travel or known exposure to COVID19 The patient denies respiratory symptoms of COVID 19 at this time. The importance of social distancing was discussed today.     Review of Systems  Constitutional: Negative for chills and fever.  HENT: Negative for congestion and ear pain.   Eyes: Negative for pain and redness.  Respiratory: Negative for cough and shortness of breath.   Cardiovascular: Positive for chest pain. Negative for palpitations and leg swelling.  Gastrointestinal: Negative for abdominal pain, blood in stool, constipation, diarrhea, nausea and vomiting.  Genitourinary: Negative for dysuria.  Musculoskeletal: Negative for falls and myalgias.  Skin: Negative for rash.  Neurological: Negative for dizziness.  Psychiatric/Behavioral: Negative for depression. The patient is not nervous/anxious.       Past Medical History:  Diagnosis Date  . CAD (coronary artery disease)   . Chest pain   . Disorders of bursae and tendons in shoulder region, unspecified    R, tendonitis  . DM (diabetes mellitus) ([place])    w/o complications  . Dyslipidemia   . GERD (gastroesophageal reflux disease)   . HTN (hypertension)   . Hypercholesterolemia   . Hypothyroidism   . Insomnia   . Serrated adenoma of colon 2009  . Special screening for malignant neoplasm of prostate   . Special screening for malignant neoplasms, colon   . Well adult exam     reports that he quit smoking about 17 years ago. He has never used smokeless tobacco. He reports that he does not drink alcohol or  use drugs.   Current Outpatient Medications:  .  amLODipine (NORVASC) 5 MG tablet, Take 1 tablet (5 mg total) by mouth daily. Take o, Disp: 90 tablet, Rfl: 1 .  aspirin 81 MG tablet, Take 81 mg by mouth daily.  , Disp: , Rfl:  .  DEXILANT 60 MG capsule, TAKE 1 CAPSULE(60 MG) BY MOUTH DAILY, Disp: 90 capsule, Rfl: 1 .  fluticasone (FLONASE) 50 MCG/ACT nasal spray,  SHAKE LIQUID AND USE 2 SPRAYS IN EACH NOSTRIL EVERY DAY, Disp: 16 g, Rfl: 2 .  levothyroxine (SYNTHROID) 125 MCG tablet, TAKE 1 TABLET(125 MCG) BY MOUTH DAILY BEFORE BREAKFAST, Disp: 90 tablet, Rfl: 3 .  meloxicam (MOBIC) 15 MG tablet, TAKE 1 TABLET(15 MG) BY MOUTH DAILY, Disp: 30 tablet, Rfl: 1 .  pravastatin (PRAVACHOL) 20 MG tablet, Take 1 tablet (20 mg total) by mouth daily., Disp: 90 tablet, Rfl: 2 .  tamsulosin (FLOMAX) 0.4 MG CAPS capsule, Take 2 capsules (0.8 mg total) by mouth daily., Disp: 60 capsule, Rfl: 5   Observations/Objective: Blood pressure 140/88, pulse 70, temperature 97.9 F (36.6 C), temperature source Temporal, height 5' 6.5" (1.689 m), weight 186 lb 4 oz (84.5 kg), SpO2 98 %.  Physical Exam Constitutional:      Appearance: He is well-developed.  HENT:     Head: Normocephalic.     Right Ear: Hearing normal.     Left Ear: Hearing normal.     Nose: Nose normal.  Neck:     Thyroid: No thyroid mass or thyromegaly.     Vascular: No carotid bruit.     Trachea: Trachea normal.  Cardiovascular:     Rate and Rhythm: Normal rate and regular rhythm.     Pulses: Normal pulses.     Heart sounds: Heart sounds not distant. No murmur. No friction rub. No gallop.      Comments: No peripheral edema Pulmonary:     Effort: Pulmonary effort is normal. No respiratory distress.     Breath sounds: Normal breath sounds.  Skin:    General: Skin is warm and dry.     Findings: No rash.  Psychiatric:        Speech: Speech normal.        Behavior: Behavior normal.        Thought Content: Thought content normal.       Diabetic foot exam: Normal inspection No skin breakdown No calluses  Normal DP pulses Normal sensation to light touch and monofilament Nails normal  Assessment and Plan      Eliezer Lofts, MD

## 2019-03-13 NOTE — Patient Instructions (Addendum)
Stop meloxicam.  Stop dexilant.. change to pantoprazole.  Can use  Pepcid AC in addition twice daily as needed.  If not improving in 2 weeks.. call  to set up GI  Appointment.  Start zetia in addition to pravastatin.  Can come back in 3 months for  Lab only re-eval cholesterol if you would like.

## 2019-03-13 NOTE — Assessment & Plan Note (Addendum)
Likely due to GERD. Description consistent with GERD as worse at night, waking him up, not always exertional.  Eval with EKG given history of CAD.   EKG: Normal sinus rhythm. No change from last EKG 2018, except occ PVC.  Normal axis, normal R wave progression, No acute ST elevation or depression.

## 2019-03-13 NOTE — Assessment & Plan Note (Signed)
Diet controlled. Encouraged exercise, weight loss, healthy eating habits.  

## 2019-03-13 NOTE — Assessment & Plan Note (Addendum)
Worsened control despite diet changes and daily dexilant.  Stop all NSAIDs.  Will try change to pantoprazole.Marland Kitchen add H2 blocker. If not improving  Return to GI for re-eval with endoscopy.

## 2019-03-23 ENCOUNTER — Other Ambulatory Visit: Payer: Self-pay | Admitting: *Deleted

## 2019-03-23 MED ORDER — FLUTICASONE PROPIONATE 50 MCG/ACT NA SUSP: NASAL | 2 refills | Status: DC

## 2019-04-28 IMAGING — DX DG CHEST 2V
2 series · 2 of 2 positions shown · non-contrast
Comparison: Chest CT 04/06/2013

CLINICAL DATA: Mid chest pain.  Shortness of breath.

EXAM:
CHEST  2 VIEW

[chest pa]
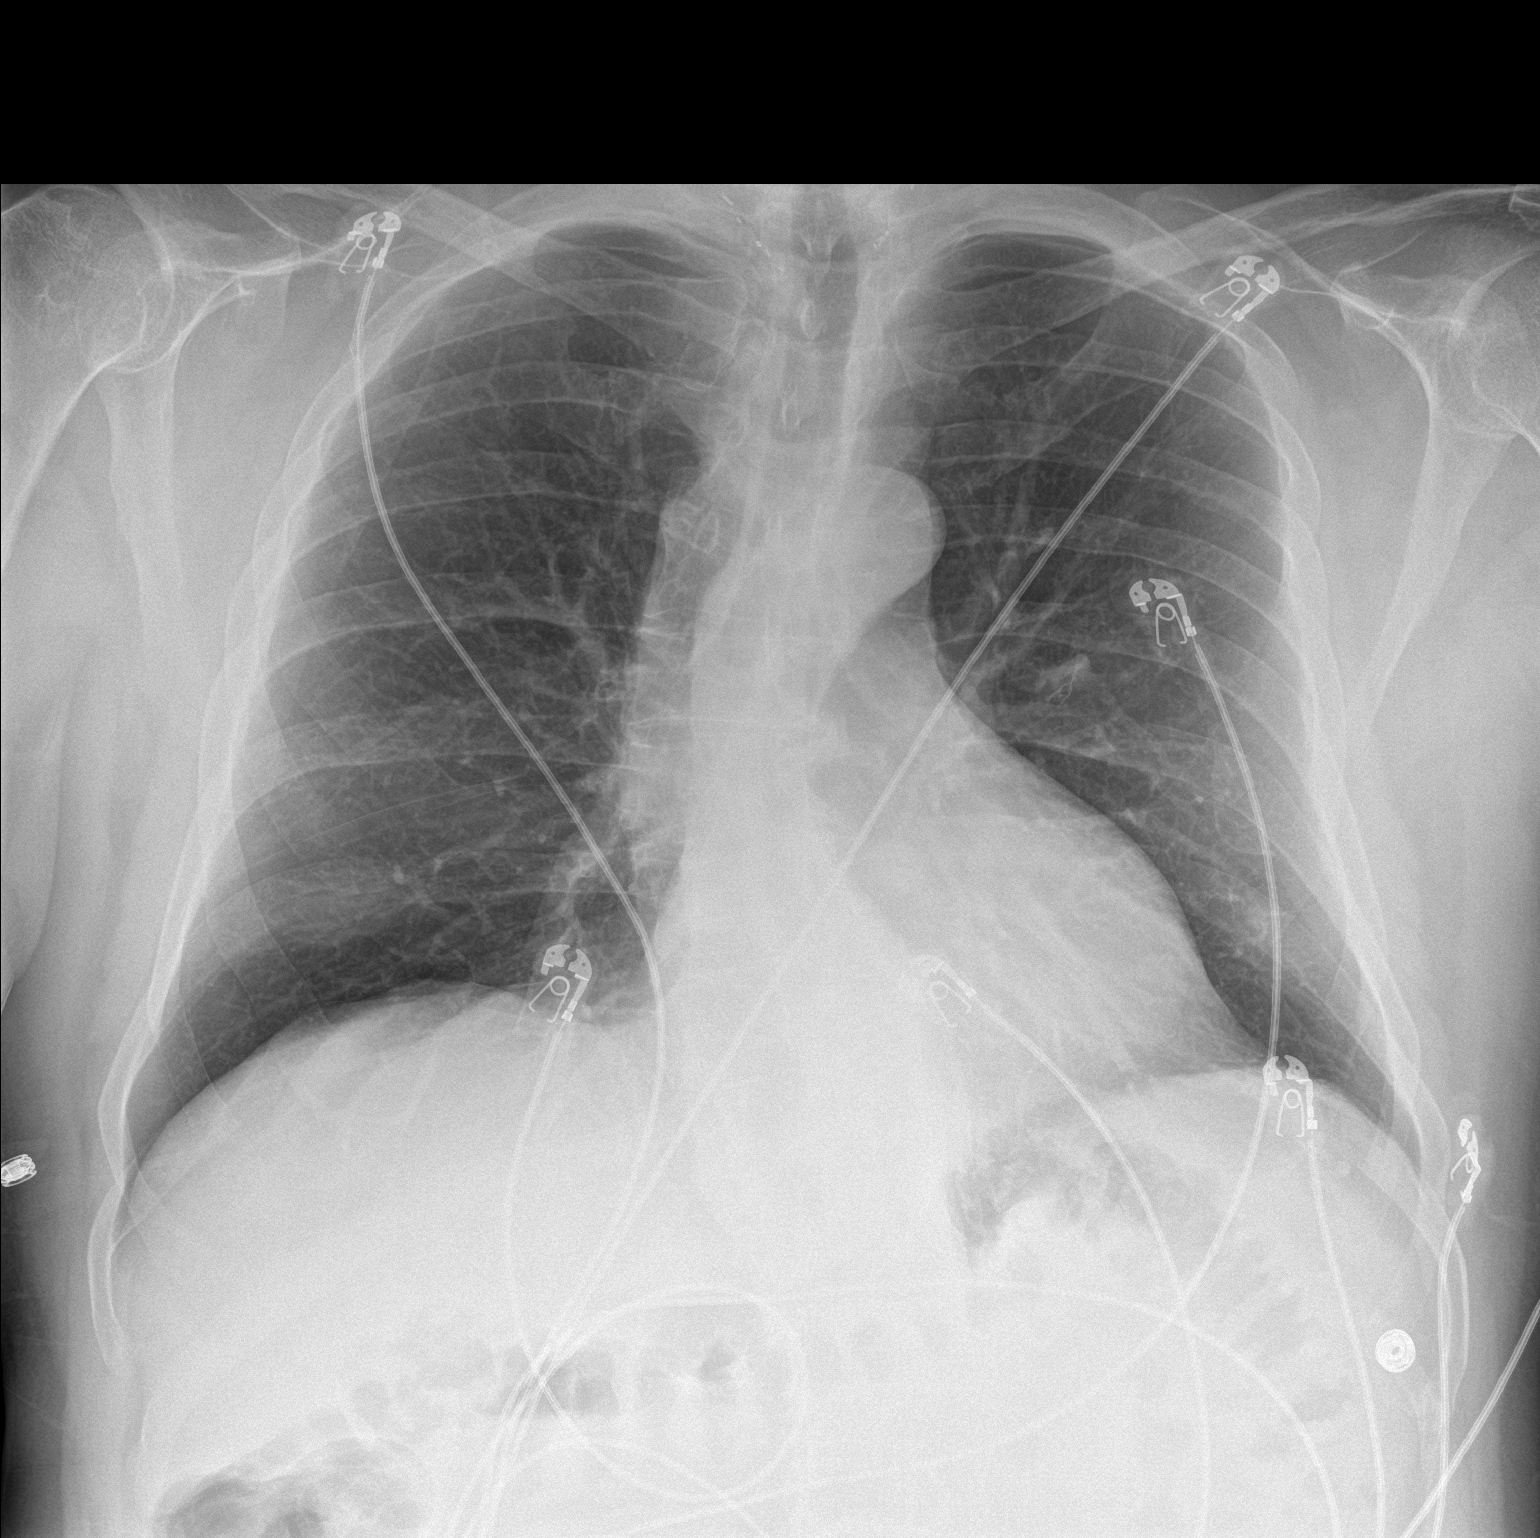

[chest lat]
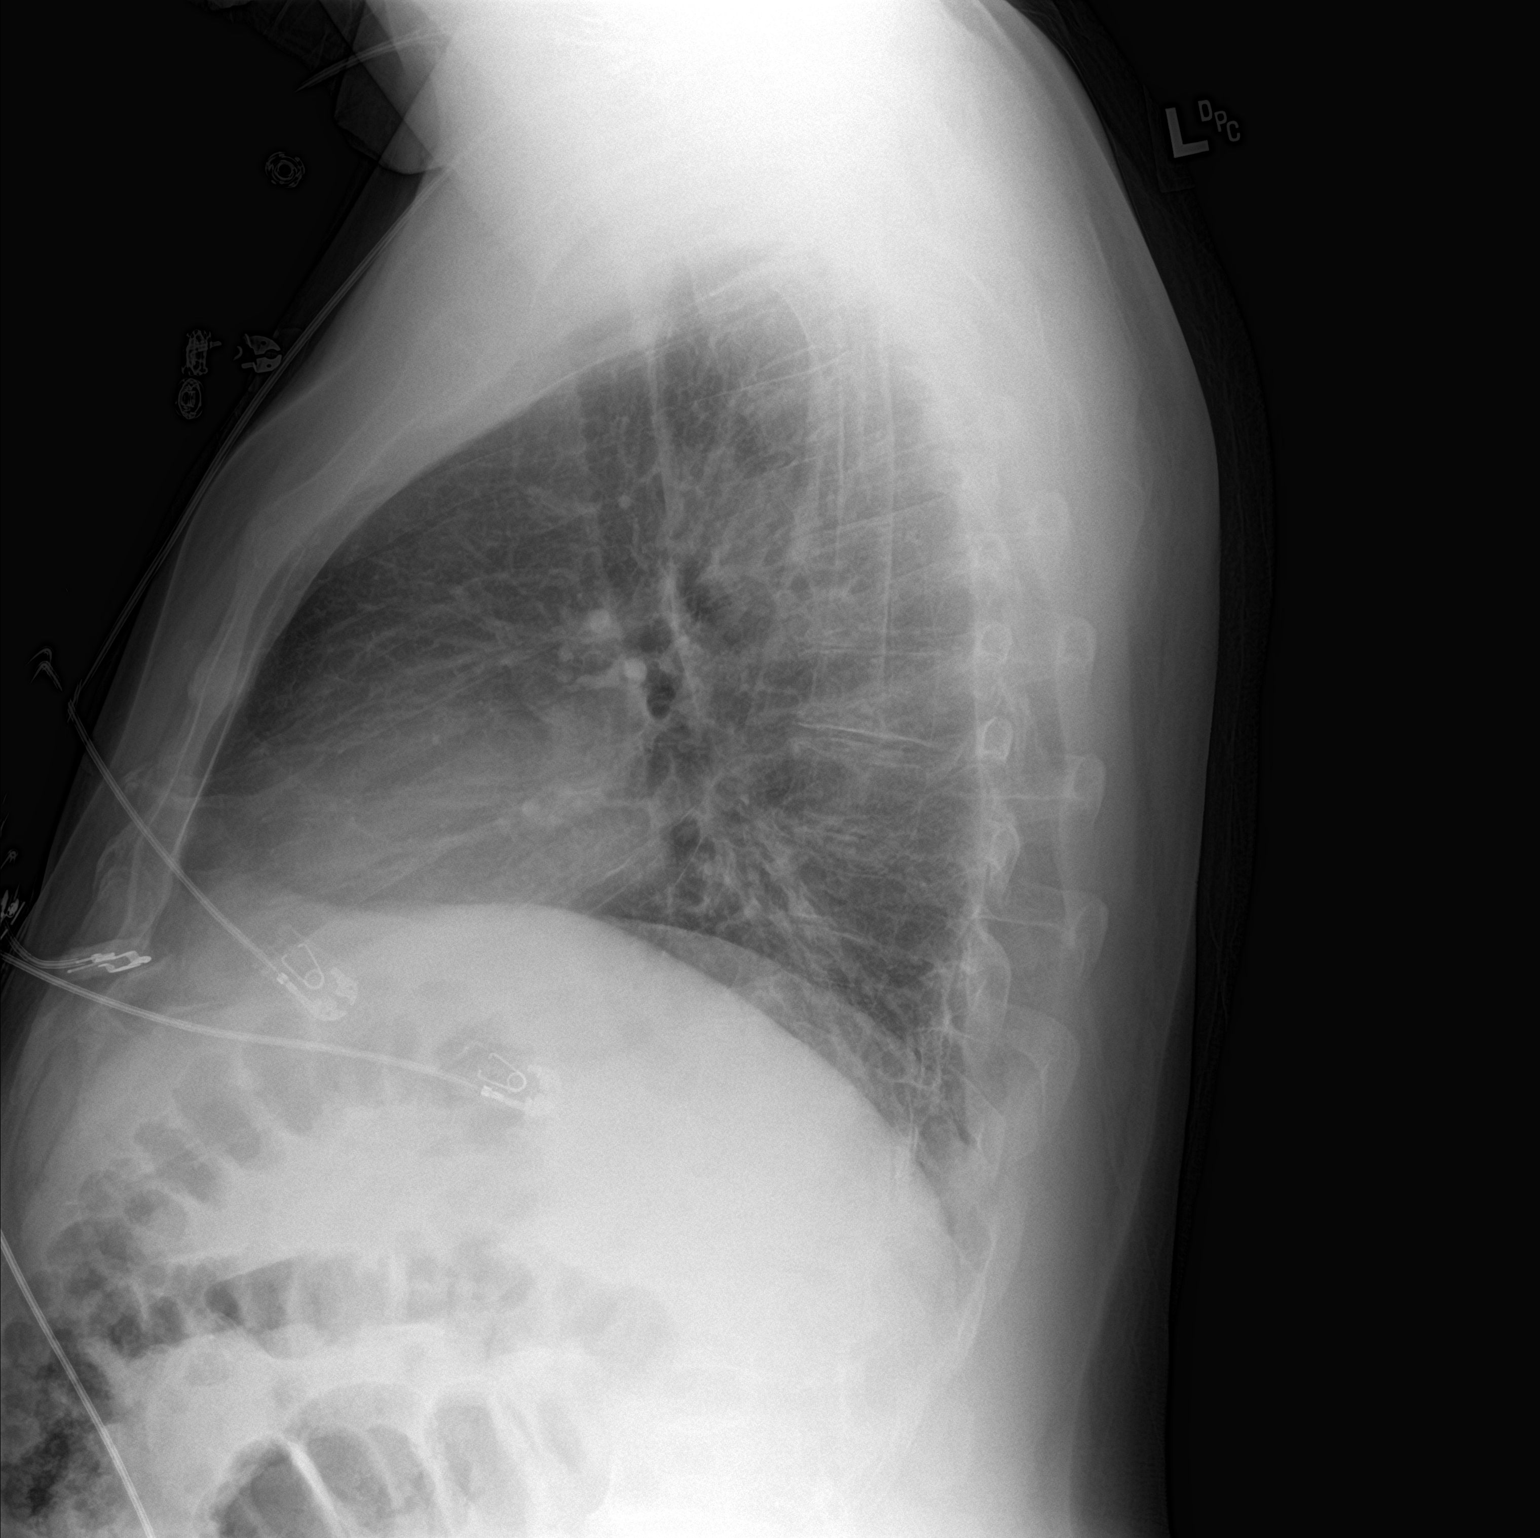

[2 of 2 positions shown; findings below may reference images not displayed]

FINDINGS: Heart size is normal, there is tortuosity of the thoracic aorta.
Surgical clips at the thoracic inlet. The lungs are clear. Pulmonary
vasculature is normal. No consolidation, pleural effusion, or
pneumothorax. Moderate thoracic scoliosis. No acute osseous
abnormalities are seen.
IMPRESSION: No acute abnormality. Thoracic scoliosis and aortic tortuosity,
stable from prior CT.

## 2019-05-18 IMAGING — MR MR ABDOMEN WO/W CM
17 series · 48 of 48 positions shown · IV contrast (20 ml multihance)
Comparison: Ultrasound on 06/30/2016 and CT on 09/17/2011

CLINICAL DATA: Indeterminate complex right renal lesion on recent
ultrasound.

EXAM:
MRI ABDOMEN WITHOUT AND WITH CONTRAST
TECHNIQUE: Multiplanar multisequence MR imaging of the abdomen was performed
both before and after the administration of intravenous contrast.
CONTRAST:  20mL MULTIHANCE GADOBENATE DIMEGLUMINE 529 MG/ML IV SOLN

[Series 3: T2 · coronal · 5.0mm · 1.56mm/px · 1 of 36 slices shown (1 of 3)]
[im 1/36]
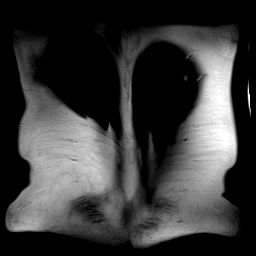

[Series 4: T1 · axial · 3.0mm · 1.19mm/px · z∈[-67,+146]mm · 5 of 144 slices shown]
[im 1/144]
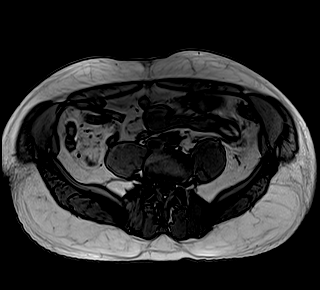
[im 36/144]
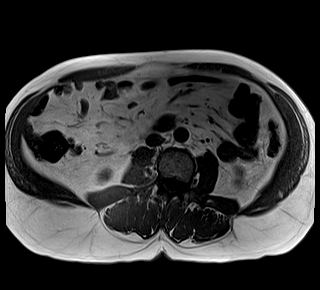
[im 72/144]
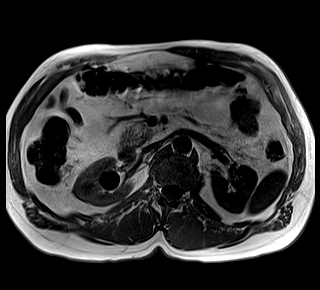
[im 108/144]
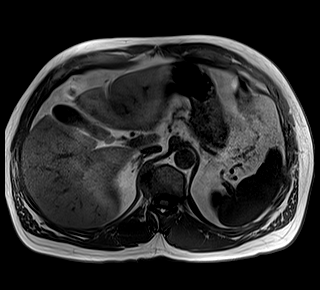
[im 144/144]
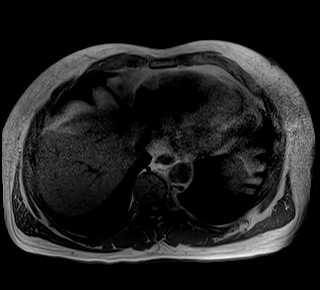

[Series 5: T2 · axial · 5.0mm · 1.48mm/px · z∈[-62,+160]mm · 2 of 38 slices shown (2 of 3)]
[im 1/38]
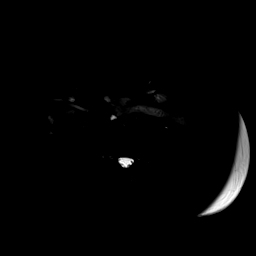
[im 38/38]
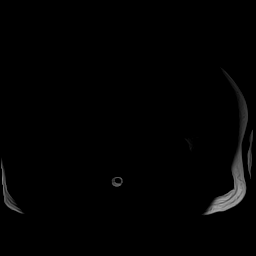

[Series 6: DWI · axial · 5.0mm · 1.42mm/px · z∈[-62,+160]mm · 5 of 114 slices shown (1 of 2)]
[im 1/114]
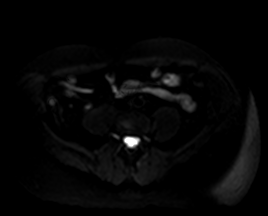
[im 29/114]
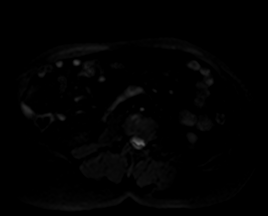
[im 57/114]
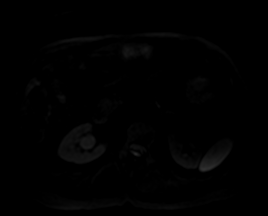
[im 85/114]
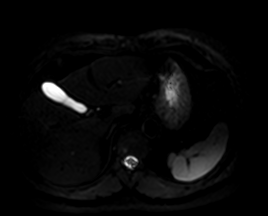
[im 114/114]
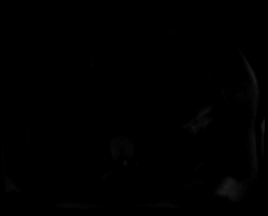

[Series 7: DWI · axial · 5.0mm · 1.42mm/px · z∈[-62,+160]mm · 2 of 38 slices shown (2 of 2)]
[im 1/38]
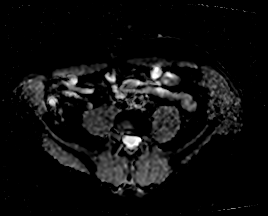
[im 38/38]
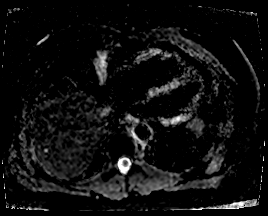

[Series 8: T2 · axial · 6.0mm · 1.22mm/px · 1 of 30 slices shown (3 of 3)]
[im 1/30]
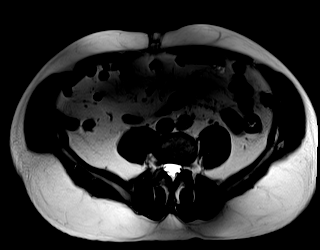

[Series 9: bSSFP · axial · 5.0mm · 1.25mm/px · z∈[-68,+154]mm · 2 of 38 slices shown]
[im 1/38]
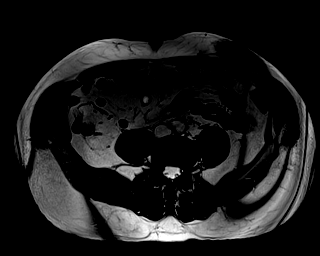
[im 38/38]
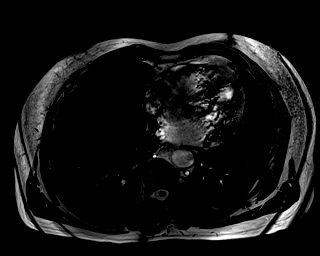

[Series 10: T1 dynamic · axial · non-contrast · 3.0mm · 1.25mm/px · z∈[-64,+149]mm · 3 of 72 slices shown]
[im 1/72]
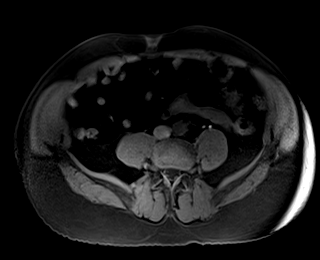
[im 36/72]
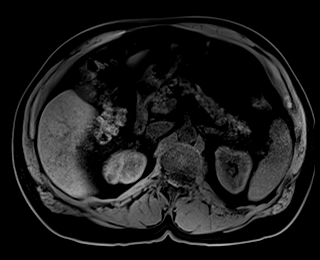
[im 72/72]
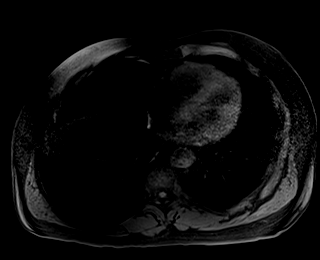

[Series 11: T1 dynamic post-contrast · axial · 3.0mm · 1.25mm/px · z∈[-64,+149]mm · 3 of 72 slices shown (1 of 9)]
[im 1/72]
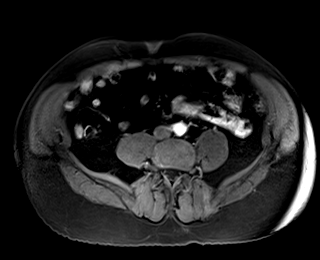
[im 36/72]
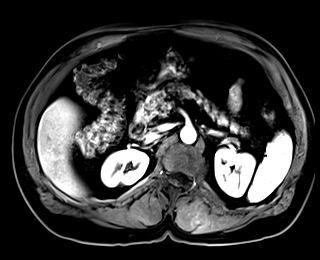
[im 72/72]
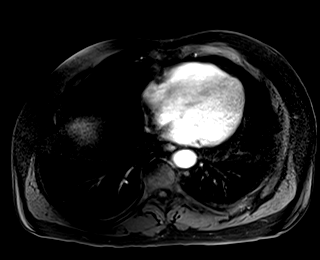

[Series 12: T1 dynamic post-contrast · axial · 3.0mm · 1.25mm/px · z∈[-64,+149]mm · 3 of 72 slices shown (2 of 9)]
[im 1/72]
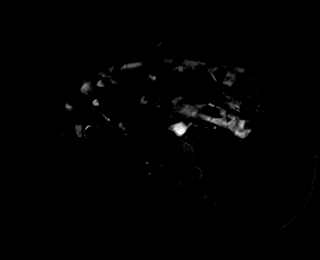
[im 36/72]
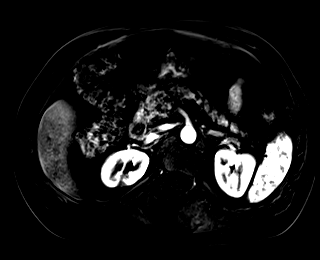
[im 72/72]
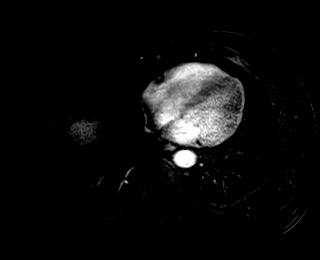

[Series 13: T1 dynamic post-contrast · axial · 3.0mm · 1.25mm/px · z∈[-64,+149]mm · 3 of 72 slices shown (3 of 9)]
[im 1/72]
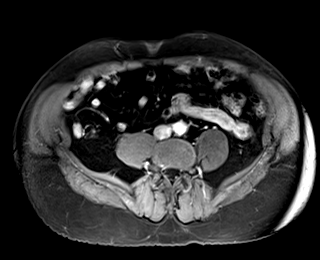
[im 36/72]
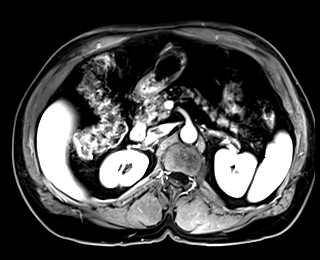
[im 72/72]
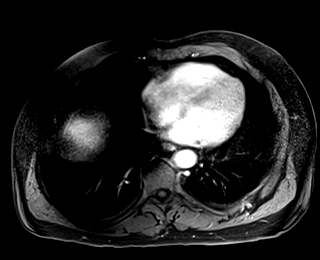

[Series 14: T1 dynamic post-contrast · axial · 3.0mm · 1.25mm/px · z∈[-64,+149]mm · 3 of 72 slices shown (4 of 9)]
[im 1/72]
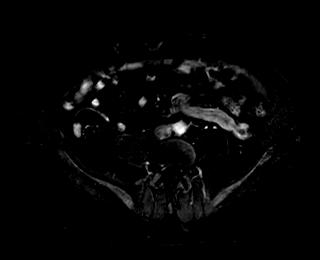
[im 36/72]
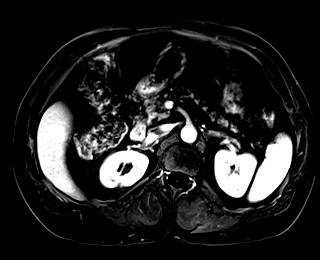
[im 72/72]
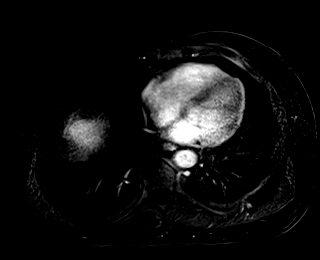

[Series 15: T1 dynamic post-contrast · axial · 3.0mm · 1.25mm/px · z∈[-64,+149]mm · 3 of 72 slices shown (5 of 9)]
[im 1/72]
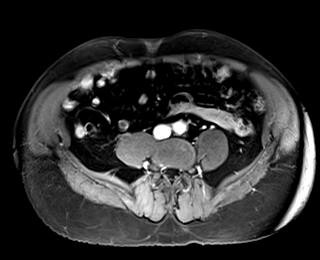
[im 36/72]
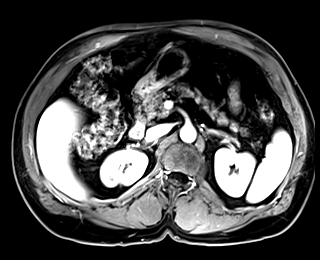
[im 72/72]
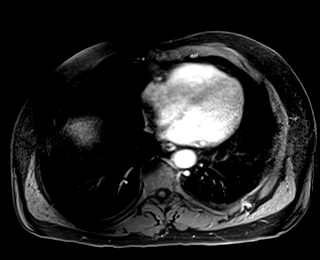

[Series 16: T1 dynamic post-contrast · axial · 3.0mm · 1.25mm/px · z∈[-64,+149]mm · 3 of 72 slices shown (6 of 9)]
[im 1/72]
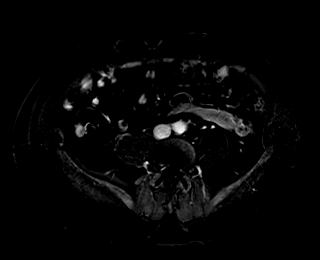
[im 36/72]
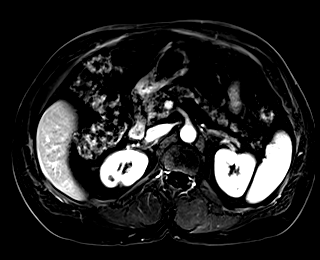
[im 72/72]
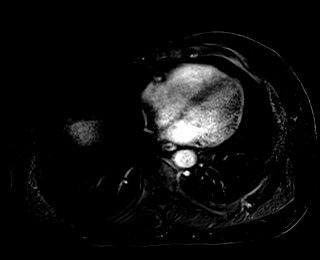

[Series 17: T1 dynamic post-contrast · coronal · 3.0mm · 1.25mm/px · 3 of 72 slices shown (7 of 9)]
[im 1/72]
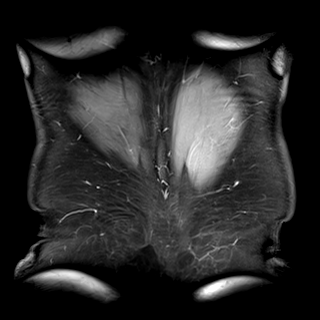
[im 36/72]
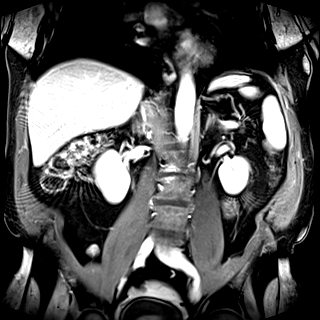
[im 72/72]
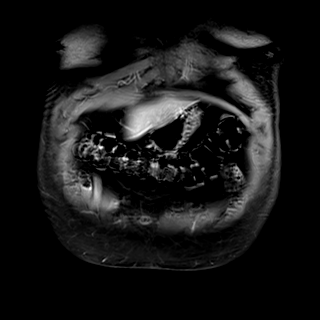

[Series 18: T1 dynamic post-contrast · axial · 3.0mm · 1.25mm/px · z∈[-64,+149]mm · 3 of 72 slices shown (8 of 9)]
[im 1/72]
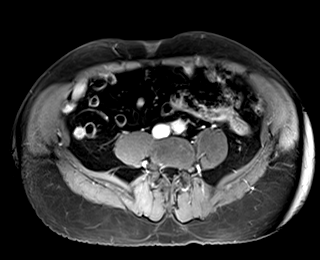
[im 36/72]
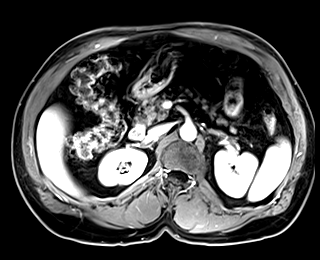
[im 72/72]
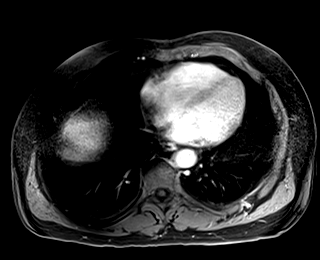

[Series 19: T1 dynamic post-contrast · axial · 3.0mm · 1.25mm/px · z∈[-64,+149]mm · 3 of 72 slices shown (9 of 9)]
[im 1/72]
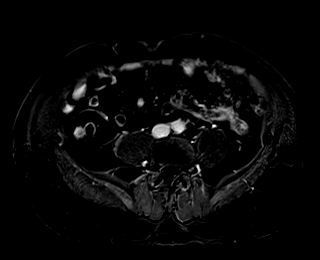
[im 36/72]
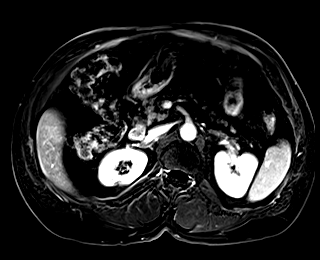
[im 72/72]
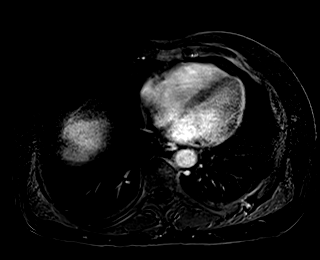

[48 of 48 positions shown; findings below may reference images not displayed]

FINDINGS: Lower chest: No acute findings.

Hepatobiliary: No masses identified. Mild diffuse hepatic steatosis.
Gallbladder is unremarkable. No evidence of biliary ductal
dilatation.

Pancreas:  No mass or inflammatory changes.

Spleen:  Within normal limits in size and appearance.

Adrenals/Urinary Tract: Normal adrenal glands. Mild scarring is seen
involving the upper pole of the right kidney. A 9 mm simple
appearing cyst is seen in the upper pole of the right kidney
adjacent to this area scarring. A 2.4 cm simple right renal
parapelvic cyst is noted. A 8 mm simple appearing cyst is also seen
in the lower pole left kidney. No complex cystic or solid renal
masses are identified. No evidence of hydronephrosis.

Stomach/Bowel: Visualized portions within the abdomen are
unremarkable.

Vascular/Lymphatic: No pathologically enlarged lymph nodes
identified. No abdominal aortic aneurysm.

Other:  None.

Musculoskeletal: No suspicious bone lesions identified. Small benign
hemangioma noted in L1 vertebral body.
IMPRESSION: Small benign-appearing renal cysts and right upper pole renal
parenchymal scarring. No evidence of renal neoplasm or
hydronephrosis.

Mild hepatic steatosis.

## 2019-05-28 ENCOUNTER — Other Ambulatory Visit: Payer: Self-pay | Admitting: *Deleted

## 2019-05-28 MED ORDER — AMLODIPINE BESYLATE 5 MG PO TABS: 5.0000 mg | ORAL_TABLET | Freq: Every day | ORAL | 1 refills | Status: DC

## 2019-06-27 ENCOUNTER — Other Ambulatory Visit: Payer: Self-pay | Admitting: Family Medicine

## 2019-07-09 ENCOUNTER — Encounter: Payer: Self-pay | Admitting: Family Medicine

## 2019-07-10 ENCOUNTER — Other Ambulatory Visit: Payer: Self-pay | Admitting: Family Medicine

## 2019-08-23 LAB — HM DIABETES EYE EXAM

## 2019-09-04 ENCOUNTER — Encounter: Payer: Self-pay | Admitting: Family Medicine

## 2019-09-04 ENCOUNTER — Other Ambulatory Visit: Payer: Self-pay

## 2019-09-04 ENCOUNTER — Ambulatory Visit: Payer: PRIVATE HEALTH INSURANCE | Admitting: Family Medicine

## 2019-09-04 VITALS — BP 110/64 | HR 73 | Temp 98.2°F | Ht 66.5 in | Wt 189.2 lb

## 2019-09-04 DIAGNOSIS — T7840XD Allergy, unspecified, subsequent encounter: Secondary | ICD-10-CM | POA: Diagnosis not present

## 2019-09-04 DIAGNOSIS — T7840XA Allergy, unspecified, initial encounter: Secondary | ICD-10-CM | POA: Insufficient documentation

## 2019-09-04 DIAGNOSIS — T782XXD Anaphylactic shock, unspecified, subsequent encounter: Secondary | ICD-10-CM

## 2019-09-04 DIAGNOSIS — T782XXA Anaphylactic shock, unspecified, initial encounter: Secondary | ICD-10-CM | POA: Insufficient documentation

## 2019-09-04 NOTE — Patient Instructions (Addendum)
Start  Zyrtec at bedtime.  We will call with referral for allergist.  keep epi pen with you at all times.

## 2019-09-04 NOTE — Assessment & Plan Note (Signed)
Unclear trigger.Brett Lowery occurred at [facility].  Symptoms resolved.  Start antihistamine. Refer to allergist for testing.Brett Lowery given severity and unknown trigger, urgent referral.  Has epi pen, instructed to keep on him at all times.

## 2019-09-04 NOTE — Progress Notes (Signed)
Chief Complaint  Patient presents with  . Hospitalization Follow-up    Allergic Rx    History of Present Illness: HPI   64 year old male presents for ER follow up in [facility].  On 09/01/2019  When he went to bed started with chest pressure and itching all over.  Started having hand tingling and feet itching.  Rash started: hives all over body.  Started shortness of breath.  Called EMS  When to ER.Marland Kitchen given breathing treatments, steroid shot... improved symptoms.  Given rx for epi pen.  No new food triggers know.. that night had cheeseburger, BBQ potato chips, pepsi  Ate crab the night before  Played [employer] prior No allergy triggers.  No bites.  No new med changes.  No ACEi/no ARB   Symptoms improved.. still mild cough, productive, clear.        This visit occurred during the SARS-CoV-2 public health emergency.  Safety protocols were in place, including screening questions prior to the visit, additional usage of staff PPE, and extensive cleaning of exam room while observing appropriate contact time as indicated for disinfecting solutions.   COVID 19 screen:  No recent travel or known exposure to COVID19 The patient denies respiratory symptoms of COVID 19 at this time. The importance of social distancing was discussed today.     Review of Systems  Constitutional: Negative for chills and fever.  HENT: Negative for congestion and ear pain.   Eyes: Negative for pain and redness.  Respiratory: Positive for cough. Negative for shortness of breath and wheezing.   Cardiovascular: Negative for chest pain, palpitations and leg swelling.  Gastrointestinal: Negative for abdominal pain, blood in stool, constipation, diarrhea, nausea and vomiting.  Genitourinary: Negative for dysuria.  Musculoskeletal: Negative for falls and myalgias.  Skin: Negative for rash.  Neurological: Negative for dizziness.  Psychiatric/Behavioral: Negative for depression. The patient is not  nervous/anxious.       Past Medical History:  Diagnosis Date  . CAD (coronary artery disease)   . Chest pain   . Disorders of bursae and tendons in shoulder region, unspecified    R, tendonitis  . DM (diabetes mellitus) ([place])    w/o complications  . Dyslipidemia   . GERD (gastroesophageal reflux disease)   . HTN (hypertension)   . Hypercholesterolemia   . Hypothyroidism   . Insomnia   . Serrated adenoma of colon 2009  . Special screening for malignant neoplasm of prostate   . Special screening for malignant neoplasms, colon   . Well adult exam     reports that he quit smoking about 18 years ago. He has never used smokeless tobacco. He reports that he does not drink alcohol and does not use drugs.   Current Outpatient Medications:  .  amLODipine (NORVASC) 5 MG tablet, Take 1 tablet (5 mg total) by mouth daily. Take o, Disp: 90 tablet, Rfl: 1 .  aspirin 81 MG tablet, Take 81 mg by mouth daily.  , Disp: , Rfl:  .  EPINEPHrine 0.3 mg/0.3 mL IJ SOAJ injection, SMARTSIG:0.3 Milligram(s) IM Once PRN, Disp: , Rfl:  .  ezetimibe (ZETIA) 10 MG tablet, Take 1 tablet (10 mg total) by mouth daily., Disp: 30 tablet, Rfl: 11 .  fluticasone (FLONASE) 50 MCG/ACT nasal spray, SHAKE LIQUID AND USE 2 SPRAYS IN EACH NOSTRIL DAILY, Disp: 16 g, Rfl: 2 .  levothyroxine (SYNTHROID) 125 MCG tablet, TAKE 1 TABLET(125 MCG) BY MOUTH DAILY BEFORE BREAKFAST, Disp: 90 tablet, Rfl: 3 .  pantoprazole (  PROTONIX) 40 MG tablet, TAKE 1 TABLET(40 MG) BY MOUTH DAILY, Disp: 30 tablet, Rfl: 2 .  pravastatin (PRAVACHOL) 20 MG tablet, Take 1 tablet (20 mg total) by mouth daily., Disp: 90 tablet, Rfl: 2 .  tamsulosin (FLOMAX) 0.4 MG CAPS capsule, TAKE 2 CAPSULES(0.8 MG) BY MOUTH DAILY, Disp: 60 capsule, Rfl: 5   Observations/Objective: Blood pressure 110/64, pulse 73, temperature 98.2 F (36.8 C), temperature source Temporal, height 5' 6.5" (1.689 m), weight 189 lb 4 oz (85.8 kg), SpO2 95 %.  Physical  Exam Constitutional:      Appearance: He is well-developed.  HENT:     Head: Normocephalic.     Right Ear: Hearing normal.     Left Ear: Hearing normal.     Nose: Nose normal.  Neck:     Thyroid: No thyroid mass or thyromegaly.     Vascular: No carotid bruit.     Trachea: Trachea normal.  Cardiovascular:     Rate and Rhythm: Normal rate and regular rhythm.     Pulses: Normal pulses.     Heart sounds: Heart sounds not distant. No murmur heard.  No friction rub. No gallop.      Comments: No peripheral edema Pulmonary:     Effort: Pulmonary effort is normal. No respiratory distress.     Breath sounds: Normal breath sounds.  Skin:    General: Skin is warm and dry.     Findings: No rash.  Psychiatric:        Speech: Speech normal.        Behavior: Behavior normal.        Thought Content: Thought content normal.      Assessment and Plan  Anaphylactic syndrome  Unclear trigger.Marland Kitchen occurred at [facility].  Symptoms resolved.  Start antihistamine. Refer to allergist for testing.Marland Kitchen given severity and unknown trigger, urgent referral.  Has epi pen, instructed to keep on him at all times.     Eliezer Lofts, MD

## 2019-09-06 ENCOUNTER — Telehealth: Payer: Self-pay | Admitting: Family Medicine

## 2019-09-06 DIAGNOSIS — E119 Type 2 diabetes mellitus without complications: Secondary | ICD-10-CM

## 2019-09-06 DIAGNOSIS — Z125 Encounter for screening for malignant neoplasm of prostate: Secondary | ICD-10-CM

## 2019-09-06 DIAGNOSIS — E890 Postprocedural hypothyroidism: Secondary | ICD-10-CM

## 2019-09-06 NOTE — Telephone Encounter (Signed)
-----   Message from Ellamae Sia sent at 08/21/2019  3:36 PM EDT ----- Regarding: Lab orders for Friday, 7.16.21 Patient is scheduled for CPX labs, please order future labs, Thanks , Karna Christmas

## 2019-09-07 ENCOUNTER — Other Ambulatory Visit: Payer: PRIVATE HEALTH INSURANCE

## 2019-09-13 ENCOUNTER — Encounter: Payer: PRIVATE HEALTH INSURANCE | Admitting: Family Medicine

## 2019-09-25 ENCOUNTER — Telehealth: Payer: Self-pay | Admitting: Family Medicine

## 2019-09-25 NOTE — Telephone Encounter (Signed)
Please schedule CPE with fasting labs prior with Dr. Bedsole.  

## 2019-09-26 NOTE — Progress Notes (Signed)
New Patient Note  RE: Brett Lowery MRN: [id number] DOB: [birth year] Date of Office Visit: 09/27/2019  Referring provider: [name], MD Primary care provider: [name], MD  Chief Complaint: Allergic Rhinitis  (3 weeks ago had bad reaction itching, couldn't breath EMS came ER gave EPI and he was able to breath and breathing test was given.)  History of Present Illness: I had the pleasure of seeing Brett Lowery for initial evaluation at the [facility] on 09/27/2019. He is a 64 y.o. male, who is referred here by [name], MD for the evaluation of allergic reaction.  Patient had a reaction about 3 weeks ago while in a camper in [place].  He went to bed around 10:30PM. He was feeling himself when he went to bed. He woke up around 1AM due to itching on hands, feet, scalp. He then noticed some welts on the sides of his torso and abdominal area.  Then about 10 minutes afterwards he noticed he couldn't breathe.  His wife called 38 and EMS gave him oxygen which did not help. He was given an IM epi which helped and at the hospital he received steroids, breathing treatment with good benefit. Symptoms resolved within 6 hours.  Patient is not sure if he had any insect/bug bites. Patient went to the beach and played putt putt that day.  He has had this camper for awhile and goes to the beach quite often with it. They did take their dog with them.   Patient never had any type of allergic reaction prior to this.  No ecchymosis upon resolution. Suspected triggers are unknown. Denies any fevers, chills, changes in medications, foods, personal care products or recent infections.  For dinner he had cheeseburger with onion and ketchup, BBQ chips, diet pepsi around 5:30PM. Patient had the exact foods before and after with no issues.   Dietary History: patient has been eating other foods including milk, eggs, peanut, shellfish - shrimp, soy, wheat, meats, fruits and  vegetables. Limited tree nut, sesame, seafood ingestion.  Patient had recent tick bites. Patient had hamburger on Monday with no issues.   Previous work up includes: no. Previous history of rash/hives: no. Patient is up to date with the following cancer screening tests: colonoscopy, physical exam, prostate exam.  09/04/2019 PCP OV HPI: "64 year old male presents for ER follow up in [facility].  On 09/01/2019  When he went to bed started with chest pressure and itching all over.  Started having hand tingling and feet itching.  Rash started: hives all over body.  Started shortness of breath.  Called EMS  When to ER.Marland Kitchen given breathing treatments, steroid shot... improved symptoms.  Given rx for epi pen.  No new food triggers know.. that night had cheeseburger, BBQ potato chips, pepsi  Ate crab the night before  Played PuttPutt prior No allergy triggers.  No bites.  No new med changes.  No ACEi/no ARB"  Assessment and Plan: Brett Lowery is a 64 y.o. male with: Allergic reaction Patient had an allergic reaction 3 weeks ago while at the beach in his camper. Went to bed fine and woke up around 1AM due to itching, hives and trouble breathing. Had to call EMS and given IM epi, steroids, breathing treatment with good benefit. Denies any changes in diet, medications, personal care products or recent infections. Had dinner around 5:30PM - hamburger, onion, ketchup, BBQ chips and Pepsi. He had these foods since then with  no issues. Multiple tick bite history but consumes red meat on a regular basis with no issues. No insect/bug bites he is aware of. No prior allergic reactions.   Today's skin testing showed: Negative to indoor/outdoor allergens, fire ant and common foods.   Not sure what may have triggered this episode.   Get bloodwork as below.   For mild symptoms you can take over the counter antihistamines such as Benadryl and monitor symptoms closely. If symptoms worsen or if you have severe  symptoms including breathing issues, throat closure, significant swelling, whole body hives, severe diarrhea and vomiting, lightheadedness then inject epinephrine and seek immediate medical care afterwards.  Emergency action plan given.   Reviewed epinephrine injectable device use.   May use albuterol rescue inhaler 2 puffs every 4 to 6 hours as needed for shortness of breath, chest tightness, coughing, and wheezing. Monitor frequency of use.   Shortness of breath No prior history of asthma/COPD or inhaler use.  Today's spirometry showed: possible restrictive disease with no improvement in FEV1 post bronchodilator treatment. Clinically feeling unchanged.   May use albuterol rescue inhaler 2 puffs every 4 to 6 hours as needed for shortness of breath, chest tightness, coughing, and wheezing. Monitor frequency of use.   Return in about 1 year (around 09/26/2020).  Meds ordered this encounter  Medications  . albuterol (VENTOLIN HFA) 108 (90 Base) MCG/ACT inhaler    Sig: Inhale 2 puffs into the lungs every 4 (four) hours as needed for wheezing or shortness of breath (coughing fit, chest tightness).    Dispense:  18 g    Refill:  1    Lab Orders     Tryptase     Allergen Fire Ant     Allergen Hymenoptera Panel     Alpha-Gal Panel  Other allergy screening: Asthma: no Rhino conjunctivitis: no Food allergy: no Medication allergy: no Hymenoptera allergy: no  Diagnostics: Spirometry:  Tracings reviewed. His effort: Good reproducible efforts. FVC: 2.91L FEV1: 2.54L, 83% predicted FEV1/FVC ratio: 87% Interpretation: Spirometry consistent with possible restrictive disease with no improvement in FEV1 post bronchodilator treatment. Clinically feeling unchanged.  Please see scanned spirometry results for details.  Skin Testing: Environmental allergy panel and select foods. Negative to indoor/outdoor allergens, fire ant and common foods.  Results discussed with patient/family.  Airborne  Adult Perc - 09/27/19 1508    Time Antigen Placed 0245    Allergen Manufacturer Lavella Hammock    Location Back    Number of Test 59    Panel 1 Select    1. Control-Buffer 50% Glycerol Negative    2. Control-Histamine 1 mg/ml 2+    3. Albumin saline Negative    4. [place] Negative    5. [place] Negative    6. Johnson Negative    7. [place] Blue Negative    8. Meadow Fescue Negative    9. Perennial Rye Negative    10. Sweet Vernal Negative    11. Timothy Negative    12. Cocklebur Negative    13. Burweed Marshelder Negative    14. Ragweed, short Negative    15. Ragweed, Giant Negative    16. Plantain,  English Negative    17. Lamb's Quarters Negative    18. Sheep Sorrell Negative    19. Rough Pigweed Negative    20. Marsh Elder, Rough Negative    21. Mugwort, Common Negative    22. Ash mix Negative    23. Birch mix Negative    24. Marathon Oil  Negative    25. Box, Elder Negative    26. Cedar, red Negative    27. Cottonwood, [place] Negative    28. Elm mix Negative    29. Hickory Negative    30. Maple mix Negative    31. Oak, [place] mix Negative    32. Pecan Pollen Negative    33. Pine mix Negative    34. Sycamore Eastern Negative    35. [place], Black Pollen Negative    36. Alternaria alternata Negative    37. Cladosporium Herbarum Negative    38. Aspergillus mix Negative    39. Penicillium mix Negative    40. Bipolaris sorokiniana (Helminthosporium) Negative    41. Drechslera spicifera (Curvularia) Negative    42. Mucor plumbeus Negative    43. Fusarium moniliforme Negative    44. Aureobasidium pullulans (pullulara) Negative    45. Rhizopus oryzae Negative    46. Botrytis cinera Negative    47. Epicoccum nigrum Negative    48. Phoma betae Negative    49. Candida Albicans Negative    50. Trichophyton mentagrophytes Negative    51. Mite, D Farinae  5,000 AU/ml Negative    52. Mite, D Pteronyssinus  5,000 AU/ml Negative    53. Cat Hair 10,000 BAU/ml Negative    54.  Dog  Epithelia Negative    55. Mixed Feathers Negative    56. Horse Epithelia Negative    57. Cockroach, German Negative    58. Mouse Negative    59. Tobacco Leaf Negative          Food Adult Perc - 09/27/19 1500    Time Antigen Placed 0245    Allergen Manufacturer Lavella Hammock    Location Back    Number of allergen test 14    1. Peanut Negative    2. Soybean Negative    3. Wheat Negative    4. Sesame Negative    5. Milk, cow Negative    6. Egg White, Chicken Negative    7. Casein Negative    8. Shellfish Mix Negative    9. Fish Mix Negative    40. Beef Negative    42. Tomato Negative    43. White Potato Negative    49. Onion Negative    6. Other Negative   Catering manager          Past Medical History: Patient Active Problem List   Diagnosis Date Noted  . 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 Allergic reaction 09/04/2019  . 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 BPH associated with nocturia 07/05/2017  . Low back pain 05/23/2013  . Chronic chest pain 02/27/2013  . DOE (dyspnea on exertion) 02/27/2013  . DDD (degenerative disc disease), cervical 01/01/2013  . Chronic neck pain 09/28/2012  . Tinnitus of both ears 09/28/2012  . Insomnia 09/17/2011  . Pulmonary nodule seen on imaging study 09/17/2011  . BENIGN PROSTATIC HYPERTROPHY, HX OF 12/17/2009  . CHRONIC RHINITIS 12/18/2008  . Atypical chest pain 12/18/2008  . Shortness of breath 07/11/2008  . Diabetes mellitus with no complication ([place]) 93/90/3009  . Coronary atherosclerosis 04/25/2007  . Hypothyroidism 06/15/2006  . HYPERCHOLESTEROLEMIA 06/15/2006  . Essential hypertension, benign 06/15/2006  . GERD 06/15/2006   Past Medical History:  Diagnosis Date  . CAD (coronary artery disease)   . Chest pain   . Disorders of bursae and tendons in shoulder region, unspecified    R, tendonitis  . DM (diabetes mellitus) ([place])    w/o complications  .  Dyslipidemia   . Eczema   . GERD (gastroesophageal reflux  disease)   . HTN (hypertension)   . Hypercholesterolemia   . Hypothyroidism   . Insomnia   . Serrated adenoma of colon 2009  . Special screening for malignant neoplasm of prostate   . Special screening for malignant neoplasms, colon   . Well adult exam    Past Surgical History: Past Surgical History:  Procedure Laterality Date  . CARDIAC CATHETERIZATION  2009   shoed no obstructive CAD, medical management   . CLOSED MANIPULATION SHOULDER WITH STERIOD INJECTION Left 01/18/2017   Procedure: CLOSED MANIPULATION SHOULDER WITH STEROID INJECTION;  Surgeon: Renette Butters, MD;  Location: [facility];  Service: Orthopedics;  Laterality: Left;  . cornia transplants  2003  . DG ESOPHAGUS -BA SW  2005   mod reflux and hiatal hernia  . HERNIA REPAIR  2002  . THYROIDECTOMY  2004   thyroid nodile   Medication List:  Current Outpatient Medications  Medication Sig Dispense Refill  . amLODipine (NORVASC) 5 MG tablet Take 1 tablet (5 mg total) by mouth daily. Take o 90 tablet 1  . aspirin 81 MG tablet Take 81 mg by mouth daily.      Marland Kitchen EPINEPHrine 0.3 mg/0.3 mL IJ SOAJ injection SMARTSIG:0.3 Milligram(s) IM Once PRN    . ezetimibe (ZETIA) 10 MG tablet Take 1 tablet (10 mg total) by mouth daily. 30 tablet 11  . fluticasone (FLONASE) 50 MCG/ACT nasal spray SHAKE LIQUID AND USE 2 SPRAYS IN EACH NOSTRIL DAILY 16 g 2  . levothyroxine (SYNTHROID) 125 MCG tablet TAKE 1 TABLET(125 MCG) BY MOUTH DAILY BEFORE BREAKFAST 90 tablet 3  . pantoprazole (PROTONIX) 40 MG tablet TAKE 1 TABLET(40 MG) BY MOUTH DAILY 30 tablet 2  . pravastatin (PRAVACHOL) 20 MG tablet Take 1 tablet (20 mg total) by mouth daily. 90 tablet 2  . tamsulosin (FLOMAX) 0.4 MG CAPS capsule TAKE 2 CAPSULES(0.8 MG) BY MOUTH DAILY 60 capsule 5  . albuterol (VENTOLIN HFA) 108 (90 Base) MCG/ACT inhaler Inhale 2 puffs into the lungs every 4 (four) hours as needed for wheezing or shortness of breath (coughing fit, chest tightness). 18  g 1   No current facility-administered medications for this visit.   Allergies: No Known Allergies Social History: Social History   Socioeconomic History  . Marital status: Married    Spouse name: Not on file  . Number of children: Not on file  . Years of education: Not on file  . Highest education level: Not on file  Occupational History  . Not on file  Tobacco Use  . Smoking status: Former Smoker    Quit date: 03/28/2001    Years since quitting: 18.5  . Smokeless tobacco: Never Used  . Tobacco comment: 40 pack year hx   Vaping Use  . Vaping Use: Never used  Substance and Sexual Activity  . Alcohol use: No  . Drug use: No  . Sexual activity: Not on file  Other Topics Concern  . Not on file  Social History Narrative   Married x30 years; 2 daughters 5-23   Occupation: heating and Personal assistant. Does not get regular exercise.   Diet: fruit and veggies. Occ. Fast food on weekends.       designated party release form signed authorizing Mardene Celeste C. Stupka (wife) [phone]. Allen Norris 12/12/09.    Social Determinants of Health   Financial Resource Strain:   . Difficulty of Paying Living Expenses:  Food Insecurity:   . Worried About Charity fundraiser in the Last Year:   . Arboriculturist in the Last Year:   Transportation Needs:   . Film/video editor (Medical):   Marland Kitchen Lack of Transportation (Non-Medical):   Physical Activity:   . Days of Exercise per Week:   . Minutes of Exercise per Session:   Stress:   . Feeling of Stress :   Social Connections:   . Frequency of Communication with Friends and Family:   . Frequency of Social Gatherings with Friends and Family:   . Attends Religious Services:   . Active Member of Clubs or Organizations:   . Attends Archivist Meetings:   Marland Kitchen Marital Status:    Lives in a house. Smoking: denies Occupation: Firefighter HistoryFreight forwarder in the house: no Charity fundraiser in the family room: no Carpet in  the bedroom: no Heating: gas Cooling: central Pet: yes 2 dogs  Family History: Family History  Problem Relation Age of Onset  . Allergic rhinitis Maternal Grandfather   . Heart attack Neg Hx        < age 57  . Colon cancer Neg Hx   . Esophageal cancer Neg Hx   . Rectal cancer Neg Hx   . Stomach cancer Neg Hx   . Asthma Neg Hx   . Eczema Neg Hx   . Urticaria Neg Hx    Problem                               Relation Asthma                                   No  Eczema                                No  Food allergy                          No  Allergic rhino conjunctivitis     No   Review of Systems  Constitutional: Negative for appetite change, chills, fever and unexpected weight change.  HENT: Negative for congestion and rhinorrhea.   Eyes: Negative for itching.  Respiratory: Negative for cough, chest tightness, shortness of breath and wheezing.   Cardiovascular: Negative for chest pain.  Gastrointestinal: Negative for abdominal pain.  Genitourinary: Negative for difficulty urinating.  Skin: Negative for rash.  Allergic/Immunologic: Negative for environmental allergies and food allergies.  Neurological: Negative for headaches.   Objective: BP 126/74 (BP Location: Right Arm, Patient Position: Sitting, Cuff Size: Normal)   Pulse 82   Temp (!) 97.2 F (36.2 C) (Temporal)   Resp 18   Ht 5' 6.73" (1.695 m)   Wt 186 lb (84.4 kg)   SpO2 98%   BMI 29.37 kg/m  Body mass index is 29.37 kg/m. Physical Exam Vitals and nursing note reviewed.  Constitutional:      Appearance: Normal appearance. He is well-developed.  HENT:     Head: Normocephalic and atraumatic.     Right Ear: Tympanic membrane and external ear normal.     Left Ear: Tympanic membrane and external ear normal.     Nose: Nose normal.     Mouth/Throat:  Mouth: Mucous membranes are moist.     Pharynx: Oropharynx is clear.  Eyes:     Conjunctiva/sclera: Conjunctivae normal.  Cardiovascular:     Rate and  Rhythm: Normal rate and regular rhythm.     Heart sounds: Normal heart sounds. No murmur heard.  No friction rub. No gallop.   Pulmonary:     Effort: Pulmonary effort is normal.     Breath sounds: Normal breath sounds. No wheezing, rhonchi or rales.  Musculoskeletal:     Cervical back: Neck supple.  Skin:    General: Skin is warm.     Findings: No rash.  Neurological:     Mental Status: He is alert and oriented to person, place, and time.  Psychiatric:        Behavior: Behavior normal.    The plan was reviewed with the patient/family, and all questions/concerned were addressed.  It was my pleasure to see Brett Lowery today and participate in his care. Please feel free to contact me with any questions or concerns.  Sincerely,  Rexene Alberts, DO Allergy & Immunology  [facility] office: [phone] [facility] ([facility] office: [place] office: [phone]

## 2019-09-27 ENCOUNTER — Ambulatory Visit (INDEPENDENT_AMBULATORY_CARE_PROVIDER_SITE_OTHER): Payer: PRIVATE HEALTH INSURANCE | Admitting: Allergy

## 2019-09-27 ENCOUNTER — Other Ambulatory Visit: Payer: Self-pay

## 2019-09-27 ENCOUNTER — Encounter: Payer: Self-pay | Admitting: Allergy

## 2019-09-27 VITALS — BP 126/74 | HR 82 | Temp 97.2°F | Resp 18 | Ht 66.73 in | Wt 186.0 lb

## 2019-09-27 DIAGNOSIS — T7840XD Allergy, unspecified, subsequent encounter: Secondary | ICD-10-CM

## 2019-09-27 DIAGNOSIS — R0602 Shortness of breath: Secondary | ICD-10-CM

## 2019-09-27 MED ORDER — ALBUTEROL SULFATE HFA 108 (90 BASE) MCG/ACT IN AERS
2.0000 | INHALATION_SPRAY | RESPIRATORY_TRACT | 1 refills | Status: DC | PRN
Start: 2019-09-27 — End: 2021-01-06

## 2019-09-27 NOTE — Assessment & Plan Note (Addendum)
Patient had an allergic reaction 3 weeks ago while at the beach in his camper. Went to bed fine and woke up around 1AM due to itching, hives and trouble breathing. Had to call EMS and given IM epi, steroids, breathing treatment with good benefit. Denies any changes in diet, medications, personal care products or recent infections. Had dinner around 5:30PM - hamburger, onion, ketchup, BBQ chips and Pepsi. He had these foods since then with no issues. Multiple tick bite history but consumes red meat on a regular basis with no issues. No insect/bug bites he is aware of. No prior allergic reactions.   Today's skin testing showed: Negative to indoor/outdoor allergens, fire ant and common foods.   Not sure what may have triggered this episode.   Get bloodwork as below.   For mild symptoms you can take over the counter antihistamines such as Benadryl and monitor symptoms closely. If symptoms worsen or if you have severe symptoms including breathing issues, throat closure, significant swelling, whole body hives, severe diarrhea and vomiting, lightheadedness then inject epinephrine and seek immediate medical care afterwards.  Emergency action plan given.   Reviewed epinephrine injectable device use.   May use albuterol rescue inhaler 2 puffs every 4 to 6 hours as needed for shortness of breath, chest tightness, coughing, and wheezing. Monitor frequency of use.

## 2019-09-27 NOTE — Patient Instructions (Addendum)
Today's skin testing showed: Negative to indoor/outdoor allergens, fire ant and common foods.   Allergic reaction:  Not sure what may have triggered your episode.  Get bloodwork:  We are ordering labs, so please allow 1-2 weeks for the results to come back. With the newly implemented Cures Act, the labs might be visible to you at the same time that they become visible to me. However, I will not address the results until all of the results are back, so please be patient.    For mild symptoms you can take over the counter antihistamines such as Benadryl and monitor symptoms closely. If symptoms worsen or if you have severe symptoms including breathing issues, throat closure, significant swelling, whole body hives, severe diarrhea and vomiting, lightheadedness then inject epinephrine and seek immediate medical care afterwards.  Emergency action plan given.    May use albuterol rescue inhaler 2 puffs every 4 to 6 hours as needed for shortness of breath, chest tightness, coughing, and wheezing. Monitor frequency of use.   Follow up in 1 year if needed in the [place] office.

## 2019-09-27 NOTE — Assessment & Plan Note (Signed)
No prior history of asthma/COPD or inhaler use.  Today's spirometry showed: possible restrictive disease with no improvement in FEV1 post bronchodilator treatment. Clinically feeling unchanged.   May use albuterol rescue inhaler 2 puffs every 4 to 6 hours as needed for shortness of breath, chest tightness, coughing, and wheezing. Monitor frequency of use.

## 2019-09-27 NOTE — Addendum Note (Signed)
Addended by: Herbie Drape on: 09/27/2019 04:29 PM   Modules accepted: Orders

## 2019-09-28 NOTE — Telephone Encounter (Signed)
Left message asking pt to call office  °

## 2019-10-11 ENCOUNTER — Telehealth: Payer: Self-pay | Admitting: Family Medicine

## 2019-10-11 NOTE — Telephone Encounter (Signed)
Labs 11/9 cpx 11/11 Pt aware

## 2019-10-11 NOTE — Telephone Encounter (Signed)
Labs 11/9 cpx 11/11

## 2019-10-11 NOTE — Telephone Encounter (Signed)
Please schedule CPE with fasting labs prior with Dr. Randa Spike.

## 2019-11-13 ENCOUNTER — Other Ambulatory Visit: Payer: Self-pay | Admitting: Family Medicine

## 2019-11-24 ENCOUNTER — Other Ambulatory Visit: Payer: Self-pay | Admitting: Family Medicine

## 2019-12-24 ENCOUNTER — Other Ambulatory Visit: Payer: Self-pay | Admitting: Family Medicine

## 2019-12-31 ENCOUNTER — Telehealth: Payer: Self-pay | Admitting: Family Medicine

## 2019-12-31 DIAGNOSIS — Z125 Encounter for screening for malignant neoplasm of prostate: Secondary | ICD-10-CM

## 2019-12-31 DIAGNOSIS — E119 Type 2 diabetes mellitus without complications: Secondary | ICD-10-CM

## 2019-12-31 NOTE — Telephone Encounter (Signed)
-----   Message from Cloyd Stagers, RT sent at 12/18/2019  1:40 PM EDT ----- Regarding: Lab Orders for Tuesday 11.9.2021 Please place lab orders for Tuesday 11.9.2021, office visit for physical on Thursday 11.11.2021 There are thyroid orders and a microalb from July 2021, I wasn't sure if he needed more for his CPE... Thanks Tam

## 2020-01-01 ENCOUNTER — Other Ambulatory Visit: Payer: Self-pay

## 2020-01-01 ENCOUNTER — Other Ambulatory Visit (INDEPENDENT_AMBULATORY_CARE_PROVIDER_SITE_OTHER): Payer: PRIVATE HEALTH INSURANCE

## 2020-01-01 DIAGNOSIS — Z125 Encounter for screening for malignant neoplasm of prostate: Secondary | ICD-10-CM

## 2020-01-01 DIAGNOSIS — E890 Postprocedural hypothyroidism: Secondary | ICD-10-CM

## 2020-01-01 DIAGNOSIS — E119 Type 2 diabetes mellitus without complications: Secondary | ICD-10-CM | POA: Diagnosis not present

## 2020-01-01 LAB — COMPREHENSIVE METABOLIC PANEL
ALT: 19 U/L (ref 0–53)
AST: 18 U/L (ref 0–37)
Albumin: 4.5 g/dL (ref 3.5–5.2)
Alkaline Phosphatase: 58 U/L (ref 39–117)
BUN: 17 mg/dL (ref 6–23)
CO2: 30 mEq/L (ref 19–32)
Calcium: 9.4 mg/dL (ref 8.4–10.5)
Chloride: 103 mEq/L (ref 96–112)
Creatinine, Ser: 1.12 mg/dL (ref 0.40–1.50)
GFR: 70.19 mL/min (ref >60.00)
Glucose, Bld: 96 mg/dL (ref 70–99)
Potassium: 4.1 mEq/L (ref 3.5–5.1)
Sodium: 140 mEq/L (ref 135–145)
Total Bilirubin: 1.1 mg/dL (ref 0.2–1.2)
Total Protein: 6.5 g/dL (ref 6.0–8.3)

## 2020-01-01 LAB — MICROALBUMIN / CREATININE URINE RATIO
Creatinine,U: 113.6 mg/dL
Microalb Creat Ratio: 0.6 mg/g (ref 0.0–30.0)
Microalb, Ur: <0.7 mg/dL (ref 0.0–1.9)

## 2020-01-01 LAB — LIPID PANEL
Cholesterol: 144 mg/dL (ref 0–200)
HDL: 46.2 mg/dL (ref >39.00)
LDL Cholesterol: 76 mg/dL (ref 0–99)
NonHDL: 97.87
Total CHOL/HDL Ratio: 3
Triglycerides: 107 mg/dL (ref 0.0–149.0)
VLDL: 21.4 mg/dL (ref 0.0–40.0)

## 2020-01-01 LAB — T3, FREE: T3, Free: 3.5 pg/mL (ref 2.3–4.2)

## 2020-01-01 LAB — TSH: TSH: 0.05 u[IU]/mL — ABNORMAL LOW (ref 0.35–4.50)

## 2020-01-01 LAB — PSA: PSA: 2.17 ng/mL (ref 0.10–4.00)

## 2020-01-01 LAB — T4, FREE: Free T4: 1.5 ng/dL (ref 0.60–1.60)

## 2020-01-01 LAB — HEMOGLOBIN A1C: Hgb A1c MFr Bld: 6.1 % (ref 4.6–6.5)

## 2020-01-01 NOTE — Progress Notes (Signed)
No critical labs need to be addressed urgently. We will discuss labs in detail at upcoming office visit.   

## 2020-01-03 ENCOUNTER — Encounter: Payer: Self-pay | Admitting: Family Medicine

## 2020-01-03 ENCOUNTER — Other Ambulatory Visit: Payer: Self-pay

## 2020-01-03 ENCOUNTER — Ambulatory Visit (INDEPENDENT_AMBULATORY_CARE_PROVIDER_SITE_OTHER): Payer: PRIVATE HEALTH INSURANCE | Admitting: Family Medicine

## 2020-01-03 VITALS — BP 124/68 | HR 68 | Ht 66.75 in | Wt 188.0 lb

## 2020-01-03 DIAGNOSIS — E7800 Pure hypercholesterolemia, unspecified: Secondary | ICD-10-CM | POA: Diagnosis not present

## 2020-01-03 DIAGNOSIS — Z0000 Encounter for general adult medical examination without abnormal findings: Secondary | ICD-10-CM

## 2020-01-03 DIAGNOSIS — E890 Postprocedural hypothyroidism: Secondary | ICD-10-CM | POA: Diagnosis not present

## 2020-01-03 DIAGNOSIS — E119 Type 2 diabetes mellitus without complications: Secondary | ICD-10-CM | POA: Diagnosis not present

## 2020-01-03 DIAGNOSIS — I10 Essential (primary) hypertension: Secondary | ICD-10-CM

## 2020-01-03 DIAGNOSIS — I2510 Atherosclerotic heart disease of native coronary artery without angina pectoris: Secondary | ICD-10-CM

## 2020-01-03 DIAGNOSIS — N401 Enlarged prostate with lower urinary tract symptoms: Secondary | ICD-10-CM

## 2020-01-03 DIAGNOSIS — Z23 Encounter for immunization: Secondary | ICD-10-CM | POA: Diagnosis not present

## 2020-01-03 DIAGNOSIS — R351 Nocturia: Secondary | ICD-10-CM

## 2020-01-03 NOTE — Assessment & Plan Note (Signed)
Well controlled. Continue current medication.  

## 2020-01-03 NOTE — Progress Notes (Signed)
Chief Complaint  Patient presents with  . Annual Exam    History of Present Illness: HPI The patient is here for annual wellness exam and preventative care.    Hypertension:    Ato goal on amlodipine. BP Readings from Last 3 Encounters:  01/03/20 124/68  09/27/19 126/74  09/04/19 110/64  Using medication without problems or lightheadedness: none Chest pain with exertion:none Edema:none Short of breath:none Average home BPs: Other issues:  Elevated Cholesterol: Almost at goal on pravastatin.Marland Kitchen goal < 70. Using medications without problems: Muscle aches:  Diet compliance: low carb Exercise: walking daily Other complaints:   Hypothyroid : No symptoms.. free values in nml range on current dose of levothyroxine.  Diabetes: diet controlled. Using medications without difficulties: Hypoglycemic episodes: Hyperglycemic episodes: Feet problems:none Blood Sugars averaging: not chekcing eye exam within last year:yes   This visit occurred during the SARS-CoV-2 public health emergency.  Safety protocols were in place, including screening questions prior to the visit, additional usage of staff PPE, and extensive cleaning of exam room while observing appropriate contact time as indicated for disinfecting solutions.   COVID 19 screen:  No recent travel or known exposure to COVID19 The patient denies respiratory symptoms of COVID 19 at this time. The importance of social distancing was discussed today.     Review of Systems  Constitutional: Negative for chills and fever.  HENT: Negative for congestion and ear pain.   Eyes: Negative for pain and redness.  Respiratory: Negative for cough and shortness of breath.   Cardiovascular: Negative for chest pain, palpitations and leg swelling.  Gastrointestinal: Negative for abdominal pain, blood in stool, constipation, diarrhea, nausea and vomiting.  Genitourinary: Negative for dysuria.  Musculoskeletal: Negative for falls and myalgias.   Skin: Negative for rash.  Neurological: Negative for dizziness.  Psychiatric/Behavioral: Negative for depression. The patient is not nervous/anxious.       Past Medical History:  Diagnosis Date  . CAD (coronary artery disease)   . Chest pain   . Disorders of bursae and tendons in shoulder region, unspecified    R, tendonitis  . DM (diabetes mellitus) ([place])    w/o complications  . Dyslipidemia   . Eczema   . GERD (gastroesophageal reflux disease)   . HTN (hypertension)   . Hypercholesterolemia   . Hypothyroidism   . Insomnia   . Serrated adenoma of colon 2009  . Special screening for malignant neoplasm of prostate   . Special screening for malignant neoplasms, colon   . Well adult exam     reports that he quit smoking about 18 years ago. He has never used smokeless tobacco. He reports that he does not drink alcohol and does not use drugs.   Current Outpatient Medications:  .  albuterol (VENTOLIN HFA) 108 (90 Base) MCG/ACT inhaler, Inhale 2 puffs into the lungs every 4 (four) hours as needed for wheezing or shortness of breath (coughing fit, chest tightness)., Disp: 18 g, Rfl: 1 .  amLODipine (NORVASC) 5 MG tablet, TAKE 1 TABLET BY MOUTH EVERY DAY, Disp: 90 tablet, Rfl: 0 .  aspirin 81 MG tablet, Take 81 mg by mouth daily.  , Disp: , Rfl:  .  EPINEPHrine 0.3 mg/0.3 mL IJ SOAJ injection, SMARTSIG:0.3 Milligram(s) IM Once PRN, Disp: , Rfl:  .  ezetimibe (ZETIA) 10 MG tablet, Take 1 tablet (10 mg total) by mouth daily., Disp: 30 tablet, Rfl: 11 .  fluticasone (FLONASE) 50 MCG/ACT nasal spray, SHAKE LIQUID AND USE 2 SPRAYS IN EACH NOSTRIL  DAILY, Disp: 16 g, Rfl: 0 .  FML LIQUIFILM 0.1 % ophthalmic suspension, 1 drop 4 (four) times daily., Disp: , Rfl:  .  gatifloxacin (ZYMAXID) 0.5 % SOLN, SMARTSIG:In Eye(s), Disp: , Rfl:  .  levothyroxine (SYNTHROID) 125 MCG tablet, TAKE 1 TABLET(125 MCG) BY MOUTH DAILY BEFORE BREAKFAST, Disp: 90 tablet, Rfl: 0 .  oxyCODONE-acetaminophen  (PERCOCET/ROXICET) 5-325 MG tablet, Take 1 tablet by mouth every 4 (four) hours as needed., Disp: , Rfl:  .  pantoprazole (PROTONIX) 40 MG tablet, TAKE 1 TABLET(40 MG) BY MOUTH DAILY, Disp: 30 tablet, Rfl: 2 .  pravastatin (PRAVACHOL) 20 MG tablet, TAKE 1 TABLET(20 MG) BY MOUTH DAILY, Disp: 90 tablet, Rfl: 0 .  tamsulosin (FLOMAX) 0.4 MG CAPS capsule, TAKE 2 CAPSULES(0.8 MG) BY MOUTH DAILY, Disp: 60 capsule, Rfl: 0 .  zolpidem (AMBIEN) 10 MG tablet, Take 10 mg by mouth at bedtime., Disp: , Rfl:    Observations/Objective: Height 5' 6.75" (1.695 m).  Physical Exam Constitutional:      General: He is not in acute distress.    Appearance: Normal appearance. He is well-developed. He is not ill-appearing or toxic-appearing.  HENT:     Head: Normocephalic and atraumatic.     Right Ear: Hearing, tympanic membrane, ear canal and external ear normal.     Left Ear: Hearing, tympanic membrane, ear canal and external ear normal.     Nose: Nose normal.     Mouth/Throat:     Pharynx: Uvula midline.  Eyes:     General: Lids are normal. Lids are everted, no foreign bodies appreciated.     Conjunctiva/sclera: Conjunctivae normal.     Pupils: Pupils are equal, round, and reactive to light.  Neck:     Thyroid: No thyroid mass or thyromegaly.     Vascular: No carotid bruit.     Trachea: Trachea and phonation normal.  Cardiovascular:     Rate and Rhythm: Normal rate and regular rhythm.     Pulses: Normal pulses.     Heart sounds: S1 normal and S2 normal. No murmur heard.  No gallop.   Pulmonary:     Breath sounds: Normal breath sounds. No wheezing, rhonchi or rales.  Abdominal:     General: Bowel sounds are normal.     Palpations: Abdomen is soft.     Tenderness: There is no abdominal tenderness. There is no guarding or rebound.     Hernia: No hernia is present.  Musculoskeletal:     Cervical back: Normal range of motion and neck supple.  Lymphadenopathy:     Cervical: No cervical adenopathy.   Skin:    General: Skin is warm and dry.     Findings: No rash.  Neurological:     Mental Status: He is alert.     Cranial Nerves: No cranial nerve deficit.     Sensory: No sensory deficit.     Gait: Gait normal.     Deep Tendon Reflexes: Reflexes are normal and symmetric.  Psychiatric:        Speech: Speech normal.        Behavior: Behavior normal.        Judgment: Judgment normal.      Assessment and Plan The patient's preventative maintenance and recommended screening tests for an annual wellness exam were reviewed in full today. Brought up to date unless services declined.  Counselled on the importance of diet, exercise, and its role in overall health and mortality. The patient's FH and SH was reviewed,  including their home life, tobacco status, and drug and alcohol status.    Vaccines:Due for PNA and consider shingles. Prostate Cancer Screen:Stable control. DIX:[id number] Nonsmoker Hep C: done  Colonoscopy: due this year. Stable pulm nodule 2013-2015.Marland Kitchen No further imaging needed.  Hypothyroidism Well controlled. Continue current medication.   Diabetes mellitus with no complication (HCC) Diet controlled. No early kidney issues.  HYPERCHOLESTEROLEMIA Almost at goal on statin ( LDL goal< 70 given athersclerosis)  Essential hypertension, benign Well controlled. Continue current medication.   Coronary atherosclerosis Risk factor control.  BPH associated with nocturia  Stable on flomax   Eliezer Lofts, MD

## 2020-01-03 NOTE — Addendum Note (Signed)
Addended by: Amado Coe on: 01/03/2020 09:24 AM   Modules accepted: Orders

## 2020-01-03 NOTE — Assessment & Plan Note (Signed)
Stable on flomax.   

## 2020-01-03 NOTE — Assessment & Plan Note (Signed)
Almost at goal on statin ( LDL goal< 70 given athersclerosis)

## 2020-01-03 NOTE — Assessment & Plan Note (Signed)
Risk factor control. 

## 2020-01-03 NOTE — Assessment & Plan Note (Signed)
Diet controlled. No early kidney issues.

## 2020-01-03 NOTE — Patient Instructions (Signed)
Continue current meds and continue great work on healthy eating and regular exercise. Consider 3rd dose of COVID vaccine in 1-2 weeks.  Call to scheduled colonoscopy this year.

## 2020-01-23 ENCOUNTER — Other Ambulatory Visit: Payer: Self-pay | Admitting: Family Medicine

## 2020-02-04 ENCOUNTER — Other Ambulatory Visit: Payer: Self-pay | Admitting: Family Medicine

## 2020-02-05 ENCOUNTER — Telehealth: Payer: Self-pay | Admitting: *Deleted

## 2020-02-05 NOTE — Telephone Encounter (Signed)
Patient left a voicemail stating that he has been trying to get a refill on his thyroid medication and the pharmacy has requested it several times. Patient stated that he has been out of his medication for 3 days. Patient stated no need to call him back, but to send the script to the pharmacy.  Called and spoke to [place] the pharmacist at [facility] and advised him that a script for levothyroxine was sent in on 11/26/19. Nicki Reaper confirmed that they did received the script on 11/26/19. Nicki Reaper stated that patient has picked up #30 twice from the script sent in 11/26/19 and has #30 left. Nicki Reaper stated that he will get the script ready for the patient.  Left a voicemail for patient to call back.

## 2020-02-09 ENCOUNTER — Other Ambulatory Visit: Payer: Self-pay | Admitting: Physician Assistant

## 2020-02-09 DIAGNOSIS — I10 Essential (primary) hypertension: Secondary | ICD-10-CM

## 2020-02-09 DIAGNOSIS — Z6827 Body mass index (BMI) 27.0-27.9, adult: Secondary | ICD-10-CM

## 2020-02-09 DIAGNOSIS — U071 COVID-19: Secondary | ICD-10-CM

## 2020-02-09 DIAGNOSIS — I2510 Atherosclerotic heart disease of native coronary artery without angina pectoris: Secondary | ICD-10-CM

## 2020-02-09 DIAGNOSIS — E119 Type 2 diabetes mellitus without complications: Secondary | ICD-10-CM

## 2020-02-09 NOTE — Progress Notes (Signed)
I connected by phone with Brett Lowery on 02/09/2020 at 6:16 PM to discuss the potential use of a new treatment for mild to moderate COVID-19 viral infection in non-hospitalized patients.  This patient is a 64 y.o. male that meets the FDA criteria for Emergency Use Authorization of COVID monoclonal antibody sotrovimab, casirivimab/imdevimab or bamlamivimab/estevimab.  Has a (+) direct SARS-CoV-2 viral test result  Has mild or moderate COVID-19   Is NOT hospitalized due to COVID-19  Is within 10 days of symptom onset  Has at least one of the high risk factor(s) for progression to severe COVID-19 and/or hospitalization as defined in EUA.  Specific high risk criteria : BMI > 25, Diabetes and Cardiovascular disease or hypertension   I have spoken and communicated the following to the patient or parent/caregiver regarding COVID monoclonal antibody treatment:  1. FDA has authorized the emergency use for the treatment of mild to moderate COVID-19 in adults and pediatric patients with positive results of direct SARS-CoV-2 viral testing who are 36 years of age and older weighing at least 40 kg, and who are at high risk for progressing to severe COVID-19 and/or hospitalization.  2. The significant known and potential risks and benefits of COVID monoclonal antibody, and the extent to which such potential risks and benefits are unknown.  3. Information on available alternative treatments and the risks and benefits of those alternatives, including clinical trials.  4. Patients treated with COVID monoclonal antibody should continue to self-isolate and use infection control measures (e.g., wear mask, isolate, social distance, avoid sharing personal items, clean and disinfect "high touch" surfaces, and frequent handwashing) according to CDC guidelines.   5. The patient or parent/caregiver has the option to accept or refuse COVID monoclonal antibody treatment.  After reviewing this information with the  patient, the patient has agreed to receive one of the available covid 19 monoclonal antibodies and will be provided an appropriate fact sheet prior to infusion.  Sx onset 12/13. Set up for infusion on 12/20 @ 2:30pm.. Directions given to [facility]. Pt is aware that insurance will be charged an infusion fee. Pt is vaccinated. + home test.   Angelena Form 02/09/2020 6:16 PM

## 2020-02-11 ENCOUNTER — Ambulatory Visit (HOSPITAL_COMMUNITY): Payer: PRIVATE HEALTH INSURANCE

## 2020-02-17 ENCOUNTER — Other Ambulatory Visit: Payer: Self-pay | Admitting: Family Medicine

## 2020-03-01 ENCOUNTER — Other Ambulatory Visit: Payer: Self-pay | Admitting: Family Medicine

## 2020-03-16 ENCOUNTER — Other Ambulatory Visit: Payer: Self-pay | Admitting: Family Medicine

## 2020-04-30 ENCOUNTER — Other Ambulatory Visit: Payer: Self-pay | Admitting: Family Medicine

## 2020-05-18 ENCOUNTER — Other Ambulatory Visit: Payer: Self-pay | Admitting: Family Medicine

## 2020-05-30 ENCOUNTER — Other Ambulatory Visit: Payer: Self-pay | Admitting: Family Medicine

## 2020-06-04 ENCOUNTER — Ambulatory Visit (INDEPENDENT_AMBULATORY_CARE_PROVIDER_SITE_OTHER): Payer: PRIVATE HEALTH INSURANCE

## 2020-06-04 ENCOUNTER — Other Ambulatory Visit: Payer: Self-pay

## 2020-06-04 DIAGNOSIS — Z23 Encounter for immunization: Secondary | ICD-10-CM

## 2020-06-04 NOTE — Progress Notes (Signed)
Patient presented for 2nd shingles vaccine given by Dorien Bessent, CMA to left deltoid, patient voiced no concerns nor showed any signs of distress during injection.  

## 2020-08-15 ENCOUNTER — Other Ambulatory Visit: Payer: Self-pay | Admitting: Family Medicine

## 2020-08-15 NOTE — Telephone Encounter (Signed)
Called pt to schedule an appt. Pt told me he could not schedule appt right then because he was driving. I told pt we could schedule an appt with him next week if he would like and he said that would be fine.I told him someone would reach out to him regarding scheduling.

## 2020-08-15 NOTE — Telephone Encounter (Signed)
Please schedule follow up diabetes with fasting labs prior for Dr. Diona Browner.

## 2020-08-18 ENCOUNTER — Other Ambulatory Visit: Payer: Self-pay | Admitting: Family Medicine

## 2020-10-27 ENCOUNTER — Other Ambulatory Visit: Payer: Self-pay | Admitting: Family Medicine

## 2020-11-17 ENCOUNTER — Other Ambulatory Visit: Payer: Self-pay | Admitting: Family Medicine

## 2020-11-26 ENCOUNTER — Other Ambulatory Visit: Payer: Self-pay | Admitting: Family Medicine

## 2020-12-29 ENCOUNTER — Other Ambulatory Visit: Payer: Self-pay | Admitting: Family Medicine

## 2020-12-29 LAB — HM DIABETES FOOT EXAM

## 2020-12-31 ENCOUNTER — Other Ambulatory Visit: Payer: Self-pay | Admitting: Family Medicine

## 2021-01-06 ENCOUNTER — Ambulatory Visit (INDEPENDENT_AMBULATORY_CARE_PROVIDER_SITE_OTHER): Payer: No Typology Code available for payment source | Admitting: Family Medicine

## 2021-01-06 ENCOUNTER — Encounter: Payer: Self-pay | Admitting: Family Medicine

## 2021-01-06 ENCOUNTER — Other Ambulatory Visit: Payer: Self-pay

## 2021-01-06 VITALS — BP 110/70 | HR 71 | Temp 98.2°F | Ht 66.75 in | Wt 194.2 lb

## 2021-01-06 DIAGNOSIS — Z0000 Encounter for general adult medical examination without abnormal findings: Secondary | ICD-10-CM | POA: Diagnosis not present

## 2021-01-06 DIAGNOSIS — I10 Essential (primary) hypertension: Secondary | ICD-10-CM

## 2021-01-06 DIAGNOSIS — Z125 Encounter for screening for malignant neoplasm of prostate: Secondary | ICD-10-CM | POA: Diagnosis not present

## 2021-01-06 DIAGNOSIS — E119 Type 2 diabetes mellitus without complications: Secondary | ICD-10-CM

## 2021-01-06 DIAGNOSIS — E890 Postprocedural hypothyroidism: Secondary | ICD-10-CM

## 2021-01-06 DIAGNOSIS — Z23 Encounter for immunization: Secondary | ICD-10-CM | POA: Diagnosis not present

## 2021-01-06 DIAGNOSIS — E7800 Pure hypercholesterolemia, unspecified: Secondary | ICD-10-CM | POA: Diagnosis not present

## 2021-01-06 DIAGNOSIS — B002 Herpesviral gingivostomatitis and pharyngotonsillitis: Secondary | ICD-10-CM

## 2021-01-06 LAB — HEMOGLOBIN A1C: Hgb A1c MFr Bld: 6.5 % (ref 4.6–6.5)

## 2021-01-06 LAB — COMPREHENSIVE METABOLIC PANEL
ALT: 19 U/L (ref 0–53)
AST: 20 U/L (ref 0–37)
Albumin: 4.6 g/dL (ref 3.5–5.2)
Alkaline Phosphatase: 66 U/L (ref 39–117)
BUN: 19 mg/dL (ref 6–23)
CO2: 30 mEq/L (ref 19–32)
Calcium: 9.4 mg/dL (ref 8.4–10.5)
Chloride: 105 mEq/L (ref 96–112)
Creatinine, Ser: 1.06 mg/dL (ref 0.40–1.50)
GFR: 74.45 mL/min (ref >60.00)
Glucose, Bld: 95 mg/dL (ref 70–99)
Potassium: 4.2 mEq/L (ref 3.5–5.1)
Sodium: 142 mEq/L (ref 135–145)
Total Bilirubin: 1 mg/dL (ref 0.2–1.2)
Total Protein: 6.6 g/dL (ref 6.0–8.3)

## 2021-01-06 LAB — MICROALBUMIN / CREATININE URINE RATIO
Creatinine,U: 116.5 mg/dL
Microalb Creat Ratio: 0.6 mg/g (ref 0.0–30.0)
Microalb, Ur: <0.7 mg/dL (ref 0.0–1.9)

## 2021-01-06 LAB — T4, FREE: Free T4: 1.53 ng/dL (ref 0.60–1.60)

## 2021-01-06 LAB — LIPID PANEL
Cholesterol: 146 mg/dL (ref 0–200)
HDL: 52.2 mg/dL (ref >39.00)
LDL Cholesterol: 80 mg/dL (ref 0–99)
NonHDL: 93.35
Total CHOL/HDL Ratio: 3
Triglycerides: 68 mg/dL (ref 0.0–149.0)
VLDL: 13.6 mg/dL (ref 0.0–40.0)

## 2021-01-06 LAB — TSH: TSH: 0.07 u[IU]/mL — ABNORMAL LOW (ref 0.35–5.50)

## 2021-01-06 LAB — PSA: PSA: 3.49 ng/mL (ref 0.10–4.00)

## 2021-01-06 LAB — T3, FREE: T3, Free: 3.8 pg/mL (ref 2.3–4.2)

## 2021-01-06 MED ORDER — VALACYCLOVIR HCL 1 G PO TABS: 2000.0000 mg | ORAL_TABLET | Freq: Two times a day (BID) | ORAL | 1 refills | Status: AC

## 2021-01-06 MED ORDER — EPINEPHRINE 0.3 MG/0.3ML IJ SOAJ: INTRAMUSCULAR | 0 refills | Status: DC

## 2021-01-06 NOTE — Addendum Note (Signed)
Addended by: Carter Kitten on: 01/06/2021 09:10 AM   Modules accepted: Orders

## 2021-01-06 NOTE — Assessment & Plan Note (Signed)
Stable, chronic.  Continue current medication.  amlodipine 5 mg daily 

## 2021-01-06 NOTE — Progress Notes (Signed)
Patient ID: Brett Lowery, male    DOB: Sep 24, 1955, 65 y.o.   MRN: [id number]  This visit was conducted in person.  BP 110/70   Pulse 71   Temp 98.2 F (36.8 C) (Temporal)   Ht 5' 6.75" (1.695 m)   Wt 194 lb 4 oz (88.1 kg)   SpO2 98%   BMI 30.65 kg/m    CC: Chief Complaint  Patient presents with   Annual Exam    Subjective:   HPI: Brett Lowery is a 65 y.o. male presenting on 01/06/2021 for Annual Exam  Due for re-eval of Cholesterol, thyroid and A1C, CMET.  Hypertension:   At goal on amlodipine 5 mg daily BP Readings from Last 3 Encounters:  01/06/21 110/70  01/03/20 124/68  09/27/19 126/74  Using medication without    Lighthededness: none Edema:none Short of breath: He feels he has some intermittent cough and SOB since COVID last  Average home BPs: Other issues:   He has been noting fever blisters every 1-2 months.   Diet: moderate  Exercise: daily  30-45 minutes    Relevant past medical, surgical, family and social history reviewed and updated as indicated. Interim medical history since our last visit reviewed. Allergies and medications reviewed and updated. Outpatient Medications Prior to Visit  Medication Sig Dispense Refill   amLODipine (NORVASC) 5 MG tablet TAKE 1 TABLET BY MOUTH EVERY DAY 90 tablet 0   aspirin 81 MG tablet Take 81 mg by mouth daily.       EPINEPHrine 0.3 mg/0.3 mL IJ SOAJ injection SMARTSIG:0.3 Milligram(s) IM Once PRN     ezetimibe (ZETIA) 10 MG tablet TAKE 1 TABLET(10 MG) BY MOUTH DAILY 30 tablet 11   fluticasone (FLONASE) 50 MCG/ACT nasal spray SHAKE LIQUID AND USE 2 SPRAYS IN EACH NOSTRIL DAILY 16 g 11   levothyroxine (SYNTHROID) 125 MCG tablet TAKE 1 TABLET(125 MCG) BY MOUTH DAILY BEFORE BREAKFAST 90 tablet 0   pantoprazole (PROTONIX) 40 MG tablet TAKE 1 TABLET(40 MG) BY MOUTH DAILY 30 tablet 2   pravastatin (PRAVACHOL) 20 MG tablet TAKE 1 TABLET(20 MG) BY MOUTH DAILY 90 tablet 3   tamsulosin (FLOMAX) 0.4 MG CAPS capsule TAKE 2  CAPSULES(0.8 MG) BY MOUTH DAILY 60 capsule 0   albuterol (VENTOLIN HFA) 108 (90 Base) MCG/ACT inhaler Inhale 2 puffs into the lungs every 4 (four) hours as needed for wheezing or shortness of breath (coughing fit, chest tightness). 18 g 1   FML LIQUIFILM 0.1 % ophthalmic suspension 1 drop 4 (four) times daily.     gatifloxacin (ZYMAXID) 0.5 % SOLN SMARTSIG:In Eye(s)     oxyCODONE-acetaminophen (PERCOCET/ROXICET) 5-325 MG tablet Take 1 tablet by mouth every 4 (four) hours as needed.     zolpidem (AMBIEN) 10 MG tablet Take 10 mg by mouth at bedtime.     No facility-administered medications prior to visit.     Per HPI unless specifically indicated in ROS section below Review of Systems  Constitutional:  Negative for fatigue and fever.  HENT:  Negative for ear pain.   Eyes:  Negative for pain.  Respiratory:  Positive for cough. Negative for shortness of breath.   Cardiovascular:  Negative for chest pain, palpitations and leg swelling.  Gastrointestinal:  Negative for abdominal pain.  Genitourinary:  Negative for dysuria.  Musculoskeletal:  Negative for arthralgias.  Neurological:  Negative for syncope, light-headedness and headaches.  Psychiatric/Behavioral:  Negative for dysphoric mood.   Objective:  BP 110/70   Pulse 71  Temp 98.2 F (36.8 C) (Temporal)   Ht 5' 6.75" (1.695 m)   Wt 194 lb 4 oz (88.1 kg)   SpO2 98%   BMI 30.65 kg/m   Wt Readings from Last 3 Encounters:  01/06/21 194 lb 4 oz (88.1 kg)  01/03/20 188 lb (85.3 kg)  09/27/19 186 lb (84.4 kg)      Physical Exam Constitutional:      General: He is not in acute distress.    Appearance: Normal appearance. He is well-developed. He is not ill-appearing or toxic-appearing.  HENT:     Head: Normocephalic and atraumatic.     Right Ear: Hearing, tympanic membrane, ear canal and external ear normal.     Left Ear: Hearing, tympanic membrane, ear canal and external ear normal.     Nose: Nose normal.     Mouth/Throat:      Pharynx: Uvula midline.  Eyes:     General: Lids are normal. Lids are everted, no foreign bodies appreciated.     Conjunctiva/sclera: Conjunctivae normal.     Pupils: Pupils are equal, round, and reactive to light.  Neck:     Thyroid: No thyroid mass or thyromegaly.     Vascular: No carotid bruit.     Trachea: Trachea and phonation normal.  Cardiovascular:     Rate and Rhythm: Normal rate and regular rhythm.     Pulses: Normal pulses.     Heart sounds: S1 normal and S2 normal. No murmur heard.   No gallop.  Pulmonary:     Breath sounds: Normal breath sounds. No wheezing, rhonchi or rales.  Abdominal:     General: Bowel sounds are normal.     Palpations: Abdomen is soft.     Tenderness: There is no abdominal tenderness. There is no guarding or rebound.     Hernia: No hernia is present.  Musculoskeletal:     Cervical back: Normal range of motion and neck supple.  Lymphadenopathy:     Cervical: No cervical adenopathy.  Skin:    General: Skin is warm and dry.     Findings: No rash.  Neurological:     Mental Status: He is alert.     Cranial Nerves: No cranial nerve deficit.     Sensory: No sensory deficit.     Gait: Gait normal.     Deep Tendon Reflexes: Reflexes are normal and symmetric.  Psychiatric:        Speech: Speech normal.        Behavior: Behavior normal.        Judgment: Judgment normal.    Diabetic foot exam: Normal inspection No skin breakdown No calluses  Normal DP pulses Normal sensation to light touch and monofilament Nails normal     Results for orders placed or performed in visit on 01/01/20  Microalbumin / creatinine urine ratio  Result Value Ref Range   Microalb, Ur <0.7 0.0 - 1.9 mg/dL   Creatinine,U 113.6 mg/dL   Microalb Creat Ratio 0.6 0.0 - 30.0 mg/g  PSA  Result Value Ref Range   PSA 2.17 0.10 - 4.00 ng/mL  Comprehensive metabolic panel  Result Value Ref Range   Sodium 140 135 - 145 mEq/L   Potassium 4.1 3.5 - 5.1 mEq/L   Chloride 103  96 - 112 mEq/L   CO2 30 19 - 32 mEq/L   Glucose, Bld 96 70 - 99 mg/dL   BUN 17 6 - 23 mg/dL   Creatinine, Ser 1.12 0.40 - 1.50 mg/dL  Total Bilirubin 1.1 0.2 - 1.2 mg/dL   Alkaline Phosphatase 58 39 - 117 U/L   AST 18 0 - 37 U/L   ALT 19 0 - 53 U/L   Total Protein 6.5 6.0 - 8.3 g/dL   Albumin 4.5 3.5 - 5.2 g/dL   GFR 70.19 >60.00 mL/min   Calcium 9.4 8.4 - 10.5 mg/dL  Lipid panel  Result Value Ref Range   Cholesterol 144 0 - 200 mg/dL   Triglycerides 107.0 0.0 - 149.0 mg/dL   HDL 46.20 >39.00 mg/dL   VLDL 21.4 0.0 - 40.0 mg/dL   LDL Cholesterol 76 0 - 99 mg/dL   Total CHOL/HDL Ratio 3    NonHDL 97.87   Hemoglobin A1c  Result Value Ref Range   Hgb A1c MFr Bld 6.1 4.6 - 6.5 %  TSH  Result Value Ref Range   TSH 0.05 (L) 0.35 - 4.50 uIU/mL  T3, free  Result Value Ref Range   T3, Free 3.5 2.3 - 4.2 pg/mL  T4, free  Result Value Ref Range   Free T4 1.50 0.60 - 1.60 ng/dL    This visit occurred during the SARS-CoV-2 public health emergency.  Safety protocols were in place, including screening questions prior to the visit, additional usage of staff PPE, and extensive cleaning of exam room while observing appropriate contact time as indicated for disinfecting solutions.   COVID 19 screen:  No recent travel or known exposure to COVID19 The patient denies respiratory symptoms of COVID 19 at this time. The importance of social distancing was discussed today.   Assessment and Plan   The patient's preventative maintenance and recommended screening tests for an annual wellness exam were reviewed in full today. Brought up to date unless services declined.  Counselled on the importance of diet, exercise, and its role in overall health and mortality. The patient's FH and SH was reviewed, including their home life, tobacco status, and drug and alcohol status.   Vaccines: Due for  flu, Prostate Cancer Screen:  Due for re-eval HIV:  Refused Nonsmoker Hep C: done  Colonoscopy:   overdue Stable pulm nodule 2013-2015.Marland Kitchen No further imaging needed.  Problem List Items Addressed This Visit     Diabetes mellitus with no complication (HCC) (Chronic)   Relevant Orders   Hemoglobin A1c   Microalbumin / creatinine urine ratio   Essential hypertension, benign (Chronic)    Stable, chronic.  Continue current medication.    amlodipine 5 mg daily.      Relevant Medications   EPINEPHrine 0.3 mg/0.3 mL IJ SOAJ injection   HYPERCHOLESTEROLEMIA (Chronic)   Relevant Medications   EPINEPHrine 0.3 mg/0.3 mL IJ SOAJ injection   Other Relevant Orders   Lipid panel   Comprehensive metabolic panel   Hypothyroidism (Chronic)   Relevant Orders   TSH   T4, free   T3, free   Recurrent oral herpes simplex   Relevant Medications   valACYclovir (VALTREX) 1000 MG tablet   Other Visit Diagnoses     Routine general medical examination at a health care facility    -  Primary   Prostate cancer screening       Relevant Orders   PSA        Eliezer Lofts, MD

## 2021-01-06 NOTE — Patient Instructions (Addendum)
Please stop at the lab to have labs drawn. Set up yearly eye exam for diabetes and have the opthalmologist send [place] a copy of the evaluation for the chart. Call Dr. Fuller Plan to set up colonoscopy. [facility]  [phone] Can try valacyclovir for fever blister at first sign of coming blister.

## 2021-01-25 ENCOUNTER — Other Ambulatory Visit: Payer: Self-pay | Admitting: Family Medicine

## 2021-01-26 ENCOUNTER — Other Ambulatory Visit: Payer: Self-pay | Admitting: Family Medicine

## 2021-02-10 ENCOUNTER — Other Ambulatory Visit: Payer: Self-pay | Admitting: Family Medicine

## 2021-02-24 ENCOUNTER — Other Ambulatory Visit: Payer: Self-pay | Admitting: Family Medicine

## 2021-04-25 ENCOUNTER — Other Ambulatory Visit: Payer: Self-pay | Admitting: Family Medicine

## 2021-08-20 ENCOUNTER — Ambulatory Visit: Payer: PRIVATE HEALTH INSURANCE | Admitting: Family Medicine

## 2021-08-20 ENCOUNTER — Encounter: Payer: Self-pay | Admitting: Family Medicine

## 2021-08-20 VITALS — BP 110/60 | HR 82 | Temp 97.8°F | Ht 66.75 in | Wt 200.1 lb

## 2021-08-20 DIAGNOSIS — J0100 Acute maxillary sinusitis, unspecified: Secondary | ICD-10-CM | POA: Diagnosis not present

## 2021-08-20 MED ORDER — AMOXICILLIN 875 MG PO TABS: 875.0000 mg | ORAL_TABLET | Freq: Two times a day (BID) | ORAL | 0 refills | Status: DC

## 2021-08-20 NOTE — Progress Notes (Signed)
Lyncoln Ledgerwood T. Kaira Stringfield, MD, [place] at [facility] [place], [address]  Phone: [phone]  FAX: [phone]  Brett Lowery - 66 y.o. male  MRN [id number]  Date of Birth: 1956-02-17  Date: 08/20/2021  PCP: [name], MD  Referral: [name], MD  Chief Complaint  Patient presents with   Nasal Congestion   Headache   Facial Pressure   Subjective:   Brett Lowery is a 66 y.o. very pleasant male patient with Body mass index is 31.58 kg/m. who presents with the following:  Brett Lowery presents with an acute illness.  He is having nasal congestion and pain and this is involves that he has pain in the maxillary sinus region.  Not having any jaw pain, earache, sore throat.  He does have some postnasal drainage as well.  He is essentially not having any cough at all. Of the symptoms are also not present, and he has no fever, chills, sweats, polyarthralgia or myalgia.  Last Saturday or so, he started to feel bad.   Max sinus tenderness.  Has used some nasal spray - nasal afrin  No cough, minimal.    Review of Systems is noted in the HPI, as appropriate  Objective:   BP 110/60   Pulse 82   Temp 97.8 F (36.6 C) (Oral)   Ht 5' 6.75" (1.695 m)   Wt 200 lb 2 oz (90.8 kg)   SpO2 95%   BMI 31.58 kg/m    Gen: WDWN, NAD; alert,appropriate and cooperative throughout exam  HEENT: Normocephalic and atraumatic. Throat clear, w/o exudate, no LAD, R TM clear, L TM - good landmarks, No fluid present. rhinnorhea.  Left frontal and maxillary sinuses: Tender, max Right frontal and maxillary sinuses: Tender, max  Neck: No ant or post LAD CV: RRR, No M/G/R Pulm: Breathing comfortably in no resp distress. no w/c/r Psych: full affect, pleasant   Laboratory and Imaging Data:  Assessment and Plan:     ICD-10-CM   1. Acute non-recurrent maxillary sinusitis  J01.00      ABX as below.   Reviewed symptomatic care as  well as ABX in this case.    Medication Management during today's office visit: Meds ordered this encounter  Medications   amoxicillin (AMOXIL) 875 MG tablet    Sig: Take 1 tablet (875 mg total) by mouth 2 (two) times daily.    Dispense:  20 tablet    Refill:  0   There are no discontinued medications.  Orders placed today for conditions managed today: No orders of the defined types were placed in this encounter.   Follow-up if needed: No follow-ups on file.  [employer] One speech-to-text software was used for transcription in this dictation.  Possible transcriptional errors can occur using Editor, commissioning.   Signed,  Maud Deed. Kempton Milne, MD   Outpatient Encounter Medications as of 08/20/2021  Medication Sig   amLODipine (NORVASC) 5 MG tablet TAKE 1 TABLET BY MOUTH EVERY DAY   amoxicillin (AMOXIL) 875 MG tablet Take 1 tablet (875 mg total) by mouth 2 (two) times daily.   aspirin 81 MG tablet Take 81 mg by mouth daily.     EPINEPHrine 0.3 mg/0.3 mL IJ SOAJ injection SMARTSIG:0.3 Milligram(s) IM Once PRN   ezetimibe (ZETIA) 10 MG tablet TAKE 1 TABLET(10 MG) BY MOUTH DAILY   fluticasone (FLONASE) 50 MCG/ACT nasal spray SHAKE LIQUID AND USE 2 SPRAYS IN EACH NOSTRIL DAILY  levothyroxine (SYNTHROID) 125 MCG tablet TAKE 1 TABLET(125 MCG) BY MOUTH DAILY BEFORE BREAKFAST   pantoprazole (PROTONIX) 40 MG tablet TAKE 1 TABLET(40 MG) BY MOUTH DAILY   pravastatin (PRAVACHOL) 20 MG tablet TAKE 1 TABLET(20 MG) BY MOUTH DAILY   tamsulosin (FLOMAX) 0.4 MG CAPS capsule TAKE 2 CAPSULES(0.8 MG) BY MOUTH DAILY   valACYclovir (VALTREX) 1000 MG tablet Take 2 tablets (2,000 mg total) by mouth 2 (two) times daily. For 1 day.   No facility-administered encounter medications on file as of 08/20/2021.

## 2021-08-23 ENCOUNTER — Other Ambulatory Visit: Payer: Self-pay | Admitting: Family Medicine

## 2021-10-01 ENCOUNTER — Encounter: Payer: Self-pay | Admitting: Nurse Practitioner

## 2021-10-01 ENCOUNTER — Ambulatory Visit: Payer: PRIVATE HEALTH INSURANCE | Admitting: Nurse Practitioner

## 2021-10-01 VITALS — BP 130/82 | HR 86 | Temp 97.6°F | Ht 66.72 in | Wt 199.0 lb

## 2021-10-01 DIAGNOSIS — Z87892 Personal history of anaphylaxis: Secondary | ICD-10-CM

## 2021-10-01 DIAGNOSIS — B86 Scabies: Secondary | ICD-10-CM | POA: Insufficient documentation

## 2021-10-01 DIAGNOSIS — R21 Rash and other nonspecific skin eruption: Secondary | ICD-10-CM | POA: Diagnosis not present

## 2021-10-01 MED ORDER — METHYLPREDNISOLONE ACETATE 80 MG/ML IJ SUSP
80.0000 mg | Freq: Once | INTRAMUSCULAR | Status: AC
  Administered 2021-10-01: 80 mg via INTRAMUSCULAR

## 2021-10-01 MED ORDER — EPINEPHRINE 0.3 MG/0.3ML IJ SOAJ: INTRAMUSCULAR | 0 refills | Status: AC

## 2021-10-01 MED ORDER — PERMETHRIN 5 % EX CREA: TOPICAL_CREAM | CUTANEOUS | 0 refills | Status: DC

## 2021-10-01 NOTE — Progress Notes (Signed)
Acute Office Visit  Subjective:     Patient ID: Brett Lowery, male    DOB: [birth year], 66 y.o.   MRN: [id number]  Chief Complaint  Patient presents with   Rash    Itchy red bumpy rash all over. Patient states this started on Tuesday and has progressively gotten worse since. He took benadryl and used cortizone cream but neither has helped.      Patient is in today for Rash  Started Tuesday afternoon. States a few bumps that came up it has gotten worse since the onset.  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  Unsure of any contact with anything in particular.  He is the only person in the house that does have the rash/discomfort.  He denies any change in laundry detergents or soaps for clothing or shower.  Benadryl, and hydrocortisone cream with out relief  States itching all the time, not necessarily worse at night  Review of Systems  Constitutional:  Negative for chills and fever.  Skin:  Positive for itching and rash.        Objective:    BP 130/82 (BP Location: Left Arm, Patient Position: Sitting, Cuff Size: Normal)   Pulse 86   Temp 97.6 F (36.4 C) (Temporal)   Ht 5' 6.72" (1.695 m)   Wt 199 lb (90.3 kg)   SpO2 96%   BMI 31.43 kg/m    Physical Exam Constitutional:      Appearance: Normal appearance.  Musculoskeletal:     Right lower leg: No edema.     Left lower leg: No edema.  Skin:    Findings: Rash present.          Comments: Small erythematous papules hard and firm in nature.  Not vesicular in nature at all  Neurological:     Mental Status: He is alert.     No results found for any visits on 10/01/21.      Assessment & Plan:   Problem List Items Addressed This Visit       Musculoskeletal and Integument   Rash - Primary    Ambiguous in nature.  Does seem like scabies but will administer Depo-Medrol 80 mg IM x 1 dose in office.  Help alleviate itching and discomfort patient is experiencing.      Scabies    Most  consistent with scabies.  Will send in permethrin cream instructions reviewed.  Follow-up if no improvement he can also use over-the-counter second-generation antihistamines as needed for itching.  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  Has EpiPen is expired request a refill.  Refill sent to pharmacy did inform patient if he uses EpiPen he needs to go to the hospital or after.      Relevant Medications   EPINEPHrine 0.3 mg/0.3 mL IJ SOAJ injection    Meds ordered this encounter  Medications   permethrin (ELIMITE) 5 % cream    Sig: Apply to entire skin from chin down to and including toes under fingernails and toenails. Leave on 8-14 hours. Repeat in 1-2 weeks. Reapply to hands after washing.    Dispense:  30 g    Refill:  0    Order Specific Question:   Supervising Provider    Answer:   Loura Pardon A [[id number]]  methylPREDNISolone acetate (DEPO-MEDROL) injection 80 mg   EPINEPHrine 0.3 mg/0.3 mL IJ SOAJ injection    Sig: SMARTSIG:0.3 Milligram(s) IM Once PRN    Dispense:  1 each    Refill:  0    Order Specific Question:   Supervising Provider    Answer:   TOWER, MARNE A [[id number]]    Return if symptoms worsen or fail to improve.  Romilda Garret, NP

## 2021-10-01 NOTE — Assessment & Plan Note (Signed)
Most consistent with scabies.  Will send in permethrin cream instructions reviewed.  Follow-up if no improvement he can also use over-the-counter second-generation antihistamines as needed for itching.  Was given at discharge

## 2021-10-01 NOTE — Patient Instructions (Signed)
Nice to see you today You can use over the counter allergy medication like zyrtec, Claritin, Xyzal, or Allegra these are suppose to be non sedating  Follow up if no improvement

## 2021-10-01 NOTE — Assessment & Plan Note (Signed)
History of anaphylaxis likely related to an insect bite.  Has EpiPen is expired request a refill.  Refill sent to pharmacy did inform patient if he uses EpiPen he needs to go to the hospital or after.

## 2021-10-01 NOTE — Assessment & Plan Note (Signed)
Ambiguous in nature.  Does seem like scabies but will administer Depo-Medrol 80 mg IM x 1 dose in office.  Help alleviate itching and discomfort patient is experiencing.

## 2021-10-02 ENCOUNTER — Telehealth: Payer: Self-pay | Admitting: Family Medicine

## 2021-10-02 ENCOUNTER — Other Ambulatory Visit: Payer: Self-pay

## 2021-10-02 ENCOUNTER — Ambulatory Visit
Admission: RE | Admit: 2021-10-02 | Discharge: 2021-10-02 | Disposition: A | Discharge status: Home / Self Care | Payer: PRIVATE HEALTH INSURANCE | Source: Ambulatory Visit | Attending: Nurse Practitioner | Admitting: Nurse Practitioner

## 2021-10-02 VITALS — BP 123/72 | HR 92 | Temp 98.2°F | Resp 18

## 2021-10-02 DIAGNOSIS — R21 Rash and other nonspecific skin eruption: Secondary | ICD-10-CM | POA: Diagnosis not present

## 2021-10-02 MED ORDER — PREDNISONE 10 MG (21) PO TBPK: ORAL_TABLET | ORAL | 0 refills | Status: AC

## 2021-10-02 MED ORDER — TRIAMCINOLONE ACETONIDE 0.1 % EX OINT: 1.0000 | TOPICAL_OINTMENT | Freq: Two times a day (BID) | CUTANEOUS | 0 refills | Status: DC

## 2021-10-02 NOTE — Telephone Encounter (Signed)
Noted  

## 2021-10-02 NOTE — Discharge Instructions (Addendum)
-   Please start and take the prednisone taper pack as prescribed.  This should help with the itching and inflammation. -You can use the Kenalog ointment twice daily to the itchy areas.  Do not use this for more than 14 days in a row as it can cause thinning of the skin. -Follow-up with a dermatologist early next week if your symptoms or not improved.  Contact information has been given.

## 2021-10-02 NOTE — ED Provider Notes (Signed)
[facility]    CSN: [id number] Arrival date & time: 10/02/21  1255      History   Chief Complaint Chief Complaint  Patient presents with   Rash    Entered by patient    HPI Brett Lowery is a 66 y.o. male.   Patient presents today with rash that he first noticed on Tuesday.  Reports there are small red bumps all over his body that are very itchy.  He went to see his primary doctor yesterday, was diagnosed with scabies and treated with Elimite which he did last night.  Reports the itching and red bumps have continued to spread.  He took an oral antihistamine this morning and reports no benefit.  He denies any recent change in soaps, personal care products, or recent travel.  Denies new medications, supplements, different/new food products.  Denies any throat or tongue swelling/itching, shortness of breath, new muscle pain/joint aches, fevers, nausea/vomiting. he has also been using over-the-counter hydrocortisone cream without relief.  Medical history significant for coronary artery disease, diet-controlled type 2 diabetes, dyslipidemia, eczema, hypothyroidism, insomnia.    Past Medical History:  Diagnosis Date   CAD (coronary artery disease)    Chest pain    Disorders of bursae and tendons in shoulder region, unspecified    R, tendonitis   DM (diabetes mellitus) ([place])    w/o complications   Dyslipidemia    Eczema    GERD (gastroesophageal reflux disease)    HTN (hypertension)    Hypercholesterolemia    Hypothyroidism    Insomnia    Serrated adenoma of colon 2009   Special screening for malignant neoplasm of prostate    Special screening for malignant neoplasms, colon    Well adult exam     Patient Active Problem List   Diagnosis Date Noted   Rash 10/01/2021   Scabies 10/01/2021   History of anaphylaxis 10/01/2021   Recurrent oral herpes simplex 01/06/2021   Noise-induced hearing loss of both ears 09/01/2018   BPH associated with nocturia 07/05/2017    Chronic chest pain 02/27/2013   DDD (degenerative disc disease), cervical 01/01/2013   Chronic neck pain 09/28/2012   Tinnitus of both ears 09/28/2012   Insomnia 09/17/2011   Pulmonary nodule seen on imaging study 09/17/2011   CHRONIC RHINITIS 12/18/2008   Diabetes mellitus with no complication ([place]) 40/81/4481   Coronary atherosclerosis 04/25/2007   Hypothyroidism 06/15/2006   HYPERCHOLESTEROLEMIA 06/15/2006   Essential hypertension, benign 06/15/2006   GERD 06/15/2006    Past Surgical History:  Procedure Laterality Date   CARDIAC CATHETERIZATION  2009   shoed no obstructive CAD, medical management    CLOSED MANIPULATION SHOULDER WITH STERIOD INJECTION Left 01/18/2017   Procedure: CLOSED MANIPULATION SHOULDER WITH STEROID INJECTION;  Surgeon: Renette Butters, MD;  Location: [facility];  Service: Orthopedics;  Laterality: Left;   cornia transplants  2003   DG ESOPHAGUS -BA SW  2005   mod reflux and hiatal hernia   HERNIA REPAIR  2002   THYROIDECTOMY  2004   thyroid nodile       Home Medications    Prior to Admission medications   Medication Sig Start Date End Date Taking? Authorizing Provider  predniSONE (STERAPRED UNI-PAK 21 TAB) 10 MG (21) TBPK tablet Take 6 tablets (60 mg total) by mouth daily for 1 day, THEN 5 tablets (50 mg total) daily for 1 day, THEN 4 tablets (40 mg total) daily for 1 day, THEN 3 tablets (30 mg total)  daily for 1 day, THEN 2 tablets (20 mg total) daily for 1 day, THEN 1 tablet (10 mg total) daily for 1 day. 10/02/21 10/08/21 Yes Noemi Chapel A, NP  triamcinolone ointment (KENALOG) 0.1 % Apply 1 Application topically 2 (two) times daily. Apply sparingly to itchy areas and clean skin twice daily.  Do not use for more than 14 days in a row. 10/02/21  Yes Noemi Chapel A, NP  amLODipine (NORVASC) 5 MG tablet TAKE 1 TABLET BY MOUTH EVERY DAY 04/25/21   Bedsole, Amy E, MD  aspirin 81 MG tablet Take 81 mg by mouth daily.      [provider]  EPINEPHrine 0.3 mg/0.3 mL IJ SOAJ injection SMARTSIG:0.3 Milligram(s) IM Once PRN 10/01/21   Michela Pitcher, NP  ezetimibe (ZETIA) 10 MG tablet TAKE 1 TABLET(10 MG) BY MOUTH DAILY 08/23/21   Bedsole, Amy E, MD  fluticasone (FLONASE) 50 MCG/ACT nasal spray SHAKE LIQUID AND USE 2 SPRAYS IN EACH NOSTRIL DAILY 01/25/21   Bedsole, Amy E, MD  levothyroxine (SYNTHROID) 125 MCG tablet TAKE 1 TABLET(125 MCG) BY MOUTH DAILY BEFORE BREAKFAST 02/24/21   Bedsole, Amy E, MD  pantoprazole (PROTONIX) 40 MG tablet TAKE 1 TABLET(40 MG) BY MOUTH DAILY 02/10/21   Bedsole, Amy E, MD  permethrin (ELIMITE) 5 % cream Apply to entire skin from chin down to and including toes under fingernails and toenails. Leave on 8-14 hours. Repeat in 1-2 weeks. Reapply to hands after washing. 10/01/21   Michela Pitcher, NP  pravastatin (PRAVACHOL) 20 MG tablet TAKE 1 TABLET(20 MG) BY MOUTH DAILY 02/10/21   Bedsole, Amy E, MD  tamsulosin (FLOMAX) 0.4 MG CAPS capsule TAKE 2 CAPSULES(0.8 MG) BY MOUTH DAILY 01/26/21   Bedsole, Amy E, MD  valACYclovir (VALTREX) 1000 MG tablet Take 2 tablets (2,000 mg total) by mouth 2 (two) times daily. For 1 day. 01/06/21   [name], MD    Family History Family History  Problem Relation Age of Onset   Allergic rhinitis Maternal Grandfather    Heart attack Neg Hx        < age 34   Colon cancer Neg Hx    Esophageal cancer Neg Hx    Rectal cancer Neg Hx    Stomach cancer Neg Hx    Asthma Neg Hx    Eczema Neg Hx    Urticaria Neg Hx     Social History Social History   Tobacco Use   Smoking status: Former    Types: Cigarettes    Quit date: 03/28/2001    Years since quitting: 20.5   Smokeless tobacco: Never   Tobacco comments:    40 pack year hx   Vaping Use   Vaping Use: Never used  Substance Use Topics   Alcohol use: No   Drug use: No     Allergies   Patient has no known allergies.   Review of Systems Review of Systems Per HPI  Physical Exam Triage Vital Signs ED  Triage Vitals [10/02/21 1321]  Enc Vitals Group     BP 123/72     Pulse Rate 92     Resp 18     Temp 98.2 F (36.8 C)     Temp Source Oral     SpO2 98 %     Weight      Height      Head Circumference      Peak Flow      Pain Score 0  Pain Loc      Pain Edu?      Excl. in [place]?    No data found.  Updated Vital Signs BP 123/72 (BP Location: Right Arm)   Pulse 92   Temp 98.2 F (36.8 C) (Oral)   Resp 18   SpO2 98%   Visual Acuity Right Eye Distance:   Left Eye Distance:   Bilateral Distance:    Right Eye Near:   Left Eye Near:    Bilateral Near:     Physical Exam Constitutional:      General: He is not in acute distress.    Appearance: Normal appearance. He is not toxic-appearing.  HENT:     Mouth/Throat:     Mouth: Mucous membranes are moist.     Pharynx: Oropharynx is clear.  Pulmonary:     Effort: Pulmonary effort is normal. No respiratory distress.     Breath sounds: No wheezing, rhonchi or rales.  Skin:    General: Skin is warm and dry.     Capillary Refill: Capillary refill takes less than 2 seconds.     Findings: Rash present. Rash is macular and papular. Rash is not purpuric, scaling or vesicular.     Comments: Rash noted to bilateral ankles, posterior trunk.  There appears to be distinct, diffuse erythematous papules.  No surrounding erythema, fluctuance, warmth, drainage.    Neurological:     Mental Status: He is alert and oriented to person, place, and time.  Psychiatric:        Behavior: Behavior is cooperative.         UC Treatments / Results  Labs (all labs ordered are listed, but only abnormal results are displayed) Labs Reviewed - No data to display  EKG   Radiology No results found.  Procedures Procedures (including critical care time)  Medications Ordered in UC Medications - No data to display  Initial Impression / Assessment and Plan / UC Course  I have reviewed the triage vital signs and the nursing  notes.  Pertinent labs & imaging results that were available during my care of the patient were reviewed by me and considered in my medical decision making (see chart for details).    Patient is a very pleasant, well-appearing 66 year old male presenting for rash.  Suspect possible contact dermatitis.  Treat with prednisone taper, triamcinolone ointment twice daily.  Encourage close follow-up with dermatology or allergy if symptoms not improved despite treatment.  Contact information given.  ER precautions discussed.  The patient was given the opportunity to ask questions.  All questions answered to their satisfaction.  The patient is in agreement to this plan.   Final Clinical Impressions(s) / UC Diagnoses   Final diagnoses:  Rash and nonspecific skin eruption     Discharge Instructions      - Please start and take the prednisone taper pack as prescribed.  This should help with the itching and inflammation. -You can use the Kenalog ointment twice daily to the itchy areas.  Do not use this for more than 14 days in a row as it can cause thinning of the skin. -Follow-up with a dermatologist early next week if your symptoms or not improved.  Contact information has been given.   ED Prescriptions     Medication Sig Dispense Auth. Provider   predniSONE (STERAPRED UNI-PAK 21 TAB) 10 MG (21) TBPK tablet Take 6 tablets (60 mg total) by mouth daily for 1 day, THEN 5 tablets (50 mg total) daily for  1 day, THEN 4 tablets (40 mg total) daily for 1 day, THEN 3 tablets (30 mg total) daily for 1 day, THEN 2 tablets (20 mg total) daily for 1 day, THEN 1 tablet (10 mg total) daily for 1 day. 21 each Noemi Chapel A, NP   triamcinolone ointment (KENALOG) 0.1 % Apply 1 Application topically 2 (two) times daily. Apply sparingly to itchy areas and clean skin twice daily.  Do not use for more than 14 days in a row. 30 g Eulogio Bear, NP      PDMP not reviewed this encounter.   Eulogio Bear, NP 10/02/21 1348

## 2021-10-02 NOTE — ED Triage Notes (Signed)
Pt here for generalized itchy rash; seen by PCP yesterday and given meds without relief

## 2021-10-02 NOTE — Telephone Encounter (Signed)
Pt said was seen on 10/02/21 and the cortisone shot, zyrtec and cream is not helping rash or itching. Pt said the rash has spread all over his body and the itching "is driving him crazy". No difficulty in breathing or swallowing and throat, neck, face and mouth not swelling. No available appts at [facility] and offered pt an appt at [facility] but pt said he is near [facility] in [place]. I scheduled pt an appt with [facility] 10/02/21 at 1 pm with UC & ED precautions and pt voiced understanding and appreciative of appt. Sending note to Dr Diona Browner since Romilda Garret NP out of office this morning and Butch Penny CMA.

## 2021-10-14 LAB — HM DIABETES EYE EXAM

## 2021-10-22 ENCOUNTER — Other Ambulatory Visit: Payer: Self-pay | Admitting: Family Medicine

## 2021-12-28 ENCOUNTER — Other Ambulatory Visit: Payer: Self-pay | Admitting: Family Medicine

## 2021-12-29 ENCOUNTER — Other Ambulatory Visit: Payer: Self-pay | Admitting: Family Medicine

## 2022-01-20 ENCOUNTER — Other Ambulatory Visit: Payer: Self-pay | Admitting: Family Medicine

## 2022-01-25 ENCOUNTER — Telehealth: Payer: Self-pay | Admitting: Family Medicine

## 2022-01-25 DIAGNOSIS — E890 Postprocedural hypothyroidism: Secondary | ICD-10-CM

## 2022-01-25 DIAGNOSIS — E119 Type 2 diabetes mellitus without complications: Secondary | ICD-10-CM

## 2022-01-25 DIAGNOSIS — Z125 Encounter for screening for malignant neoplasm of prostate: Secondary | ICD-10-CM

## 2022-01-25 DIAGNOSIS — E7800 Pure hypercholesterolemia, unspecified: Secondary | ICD-10-CM

## 2022-01-25 NOTE — Telephone Encounter (Signed)
-----   Message from Velna Hatchet, RT sent at 01/11/2022 12:20 PM EST ----- Regarding: Fri 12/8 lab Patient is scheduled for cpx, please order future labs.  Thanks, Anda Kraft

## 2022-01-27 ENCOUNTER — Other Ambulatory Visit: Payer: Self-pay | Admitting: Family Medicine

## 2022-01-28 ENCOUNTER — Other Ambulatory Visit: Payer: Self-pay | Admitting: Family Medicine

## 2022-01-29 ENCOUNTER — Other Ambulatory Visit (INDEPENDENT_AMBULATORY_CARE_PROVIDER_SITE_OTHER): Payer: PRIVATE HEALTH INSURANCE

## 2022-01-29 DIAGNOSIS — Z125 Encounter for screening for malignant neoplasm of prostate: Secondary | ICD-10-CM | POA: Diagnosis not present

## 2022-01-29 DIAGNOSIS — E890 Postprocedural hypothyroidism: Secondary | ICD-10-CM

## 2022-01-29 DIAGNOSIS — E119 Type 2 diabetes mellitus without complications: Secondary | ICD-10-CM

## 2022-01-29 LAB — COMPREHENSIVE METABOLIC PANEL
ALT: 18 U/L (ref 0–53)
AST: 20 U/L (ref 0–37)
Albumin: 4.4 g/dL (ref 3.5–5.2)
Alkaline Phosphatase: 60 U/L (ref 39–117)
BUN: 20 mg/dL (ref 6–23)
CO2: 29 mEq/L (ref 19–32)
Calcium: 9.5 mg/dL (ref 8.4–10.5)
Chloride: 105 mEq/L (ref 96–112)
Creatinine, Ser: 1.09 mg/dL (ref 0.40–1.50)
GFR: 71.46 mL/min (ref >60.00)
Glucose, Bld: 100 mg/dL — ABNORMAL HIGH (ref 70–99)
Potassium: 4.1 mEq/L (ref 3.5–5.1)
Sodium: 141 mEq/L (ref 135–145)
Total Bilirubin: 1 mg/dL (ref 0.2–1.2)
Total Protein: 6.4 g/dL (ref 6.0–8.3)

## 2022-01-29 LAB — LIPID PANEL
Cholesterol: 135 mg/dL (ref 0–200)
HDL: 45.1 mg/dL (ref >39.00)
LDL Cholesterol: 78 mg/dL (ref 0–99)
NonHDL: 89.8
Total CHOL/HDL Ratio: 3
Triglycerides: 60 mg/dL (ref 0.0–149.0)
VLDL: 12 mg/dL (ref 0.0–40.0)

## 2022-01-29 LAB — MICROALBUMIN / CREATININE URINE RATIO
Creatinine,U: 218.8 mg/dL
Microalb Creat Ratio: 0.5 mg/g (ref 0.0–30.0)
Microalb, Ur: 1 mg/dL (ref 0.0–1.9)

## 2022-01-29 LAB — PSA, MEDICARE: PSA: 2.48 ng/ml (ref 0.10–4.00)

## 2022-01-29 LAB — TSH: TSH: 0.04 u[IU]/mL — ABNORMAL LOW (ref 0.35–5.50)

## 2022-01-29 LAB — T4, FREE: Free T4: 1.6 ng/dL (ref 0.60–1.60)

## 2022-01-29 LAB — T3, FREE: T3, Free: 3.8 pg/mL (ref 2.3–4.2)

## 2022-01-29 LAB — HEMOGLOBIN A1C: Hgb A1c MFr Bld: 6.4 % (ref 4.6–6.5)

## 2022-01-29 NOTE — Progress Notes (Signed)
No critical labs need to be addressed urgently. We will discuss labs in detail at upcoming office visit.   

## 2022-02-05 ENCOUNTER — Encounter: Payer: Self-pay | Admitting: Family Medicine

## 2022-02-05 ENCOUNTER — Ambulatory Visit (INDEPENDENT_AMBULATORY_CARE_PROVIDER_SITE_OTHER): Payer: PRIVATE HEALTH INSURANCE | Admitting: Family Medicine

## 2022-02-05 VITALS — BP 110/60 | HR 88 | Temp 98.1°F | Ht 66.75 in | Wt 197.5 lb

## 2022-02-05 DIAGNOSIS — E1169 Type 2 diabetes mellitus with other specified complication: Secondary | ICD-10-CM

## 2022-02-05 DIAGNOSIS — R0989 Other specified symptoms and signs involving the circulatory and respiratory systems: Secondary | ICD-10-CM

## 2022-02-05 DIAGNOSIS — E785 Hyperlipidemia, unspecified: Secondary | ICD-10-CM

## 2022-02-05 DIAGNOSIS — Z0000 Encounter for general adult medical examination without abnormal findings: Secondary | ICD-10-CM

## 2022-02-05 DIAGNOSIS — I152 Hypertension secondary to endocrine disorders: Secondary | ICD-10-CM | POA: Insufficient documentation

## 2022-02-05 DIAGNOSIS — E890 Postprocedural hypothyroidism: Secondary | ICD-10-CM

## 2022-02-05 DIAGNOSIS — Z23 Encounter for immunization: Secondary | ICD-10-CM | POA: Diagnosis not present

## 2022-02-05 DIAGNOSIS — R351 Nocturia: Secondary | ICD-10-CM

## 2022-02-05 DIAGNOSIS — E1159 Type 2 diabetes mellitus with other circulatory complications: Secondary | ICD-10-CM

## 2022-02-05 DIAGNOSIS — N401 Enlarged prostate with lower urinary tract symptoms: Secondary | ICD-10-CM

## 2022-02-05 LAB — HM DIABETES FOOT EXAM

## 2022-02-05 NOTE — Assessment & Plan Note (Signed)
Hx of CAD, former smoker.  Increase risk for PAD, no family history.  Refer for Bilateral ABIs.

## 2022-02-05 NOTE — Progress Notes (Signed)
Patient ID: Brett Lowery, male    DOB: Apr 25, 1955, 67 y.o.   MRN: [id number]  This visit was conducted in person.  BP 110/60   Pulse 88   Temp 98.1 F (36.7 C) (Oral)   Ht 5' 6.75" (1.695 m)   Wt 197 lb 8 oz (89.6 kg)   SpO2 98%   BMI 31.17 kg/m    CC:  Chief Complaint  Patient presents with   Annual Exam    Subjective:   HPI: Brett Lowery is a 66 y.o. male presenting on 02/05/2022 for Annual Exam  The patient presents for annual complete physical and review of chronic health problems. He/She also has the following acute concerns today: none    BPH : day and night urinary frequency despite flomax... feels like  he doesn't empty completely.  No dysuria  Diabetes:   well controlled Lab Results  Component Value Date   HGBA1C 6.4 01/29/2022  Using medications without difficulties: Hypoglycemic episodes: Hyperglycemic episodes: Feet problems: no ulcers Blood Sugars averaging: not checking eye exam within last year:  yes, will request Dr. [place]  Hypertension:   On amlodipine BP Readings from Last 3 Encounters:  02/05/22 110/60  10/02/21 123/72  10/01/21 130/82  Using medication without problems or lightheadedness:  no  Chest pain with exertion: non change Edema:none Short of breath: no change Average home BPs: Other issues:  Elevated Cholesterol: LDL almost at goal  < 70 ( CAD) on pravastatin 20 mg p.o. daily Lab Results  Component Value Date   CHOL 135 01/29/2022   HDL 45.10 01/29/2022   LDLCALC 78 01/29/2022   TRIG 60.0 01/29/2022   CHOLHDL 3 01/29/2022  Using medications without problems: Muscle aches:  Diet compliance: heart healthy diet Exercise: minimal in winter Other complaints:   Hypothyroid : Normal free T3 and free T4 on levothyroxine 125 mcg daily Lab Results  Component Value Date   TSH 0.04 (L) 01/29/2022         Relevant past medical, surgical, family and social history reviewed and updated as indicated. Interim medical history  since our last visit reviewed. Allergies and medications reviewed and updated. Outpatient Medications Prior to Visit  Medication Sig Dispense Refill   amLODipine (NORVASC) 5 MG tablet TAKE 1 TABLET BY MOUTH EVERY DAY 90 tablet 0   aspirin 81 MG tablet Take 81 mg by mouth daily.       EPINEPHrine 0.3 mg/0.3 mL IJ SOAJ injection SMARTSIG:0.3 Milligram(s) IM Once PRN 1 each 0   ezetimibe (ZETIA) 10 MG tablet TAKE 1 TABLET(10 MG) BY MOUTH DAILY 90 tablet 0   fluticasone (FLONASE) 50 MCG/ACT nasal spray SHAKE LIQUID AND USE 2 SPRAYS IN EACH NOSTRIL DAILY 48 g 0   levothyroxine (SYNTHROID) 125 MCG tablet TAKE 1 TABLET(125 MCG) BY MOUTH DAILY BEFORE BREAKFAST 90 tablet 3   pantoprazole (PROTONIX) 40 MG tablet TAKE 1 TABLET(40 MG) BY MOUTH DAILY 90 tablet 3   pravastatin (PRAVACHOL) 20 MG tablet TAKE 1 TABLET(20 MG) BY MOUTH DAILY 90 tablet 3   tamsulosin (FLOMAX) 0.4 MG CAPS capsule TAKE 2 CAPSULES(0.8 MG) BY MOUTH DAILY 180 capsule 0   valACYclovir (VALTREX) 1000 MG tablet Take 2 tablets (2,000 mg total) by mouth 2 (two) times daily. For 1 day. 20 tablet 1   permethrin (ELIMITE) 5 % cream Apply to entire skin from chin down to and including toes under fingernails and toenails. Leave on 8-14 hours. Repeat in 1-2 weeks. Reapply to hands after washing. Donley  g 0   triamcinolone ointment (KENALOG) 0.1 % Apply 1 Application topically 2 (two) times daily. Apply sparingly to itchy areas and clean skin twice daily.  Do not use for more than 14 days in a row. 30 g 0   No facility-administered medications prior to visit.     Per HPI unless specifically indicated in ROS section below Review of Systems  Constitutional:  Negative for fatigue and fever.  HENT:  Negative for ear pain.   Eyes:  Negative for pain.  Respiratory:  Negative for cough.   Cardiovascular:  Negative for palpitations and leg swelling.  Gastrointestinal:  Negative for abdominal pain.  Genitourinary:  Negative for dysuria.   Musculoskeletal:  Negative for arthralgias.  Neurological:  Negative for syncope, light-headedness and headaches.  Psychiatric/Behavioral:  Negative for dysphoric mood.    Objective:  BP 110/60   Pulse 88   Temp 98.1 F (36.7 C) (Oral)   Ht 5' 6.75" (1.695 m)   Wt 197 lb 8 oz (89.6 kg)   SpO2 98%   BMI 31.17 kg/m   Wt Readings from Last 3 Encounters:  02/05/22 197 lb 8 oz (89.6 kg)  10/01/21 199 lb (90.3 kg)  08/20/21 200 lb 2 oz (90.8 kg)      Physical Exam Constitutional:      General: He is not in acute distress.    Appearance: Normal appearance. He is well-developed. He is not ill-appearing or toxic-appearing.  HENT:     Head: Normocephalic and atraumatic.     Right Ear: Hearing, tympanic membrane, ear canal and external ear normal.     Left Ear: Hearing, tympanic membrane, ear canal and external ear normal.     Nose: Nose normal.     Mouth/Throat:     Pharynx: Uvula midline.  Eyes:     General: Lids are normal. Lids are everted, no foreign bodies appreciated.     Conjunctiva/sclera: Conjunctivae normal.     Pupils: Pupils are equal, round, and reactive to light.  Neck:     Thyroid: No thyroid mass or thyromegaly.     Vascular: No carotid bruit.     Trachea: Trachea and phonation normal.  Cardiovascular:     Rate and Rhythm: Normal rate and regular rhythm.     Pulses:          Dorsalis pedis pulses are 0 on the right side and 0 on the left side.       Posterior tibial pulses are 0 on the right side and 0 on the left side.     Heart sounds: S1 normal and S2 normal. No murmur heard.    No gallop.  Pulmonary:     Breath sounds: Normal breath sounds. No wheezing, rhonchi or rales.  Abdominal:     General: Bowel sounds are normal.     Palpations: Abdomen is soft.     Tenderness: There is no abdominal tenderness. There is no guarding or rebound.     Hernia: No hernia is present.  Musculoskeletal:     Cervical back: Normal range of motion and neck supple.      Right lower leg: No edema.     Left lower leg: No edema.  Lymphadenopathy:     Cervical: No cervical adenopathy.  Skin:    General: Skin is warm and dry.     Findings: No rash.  Neurological:     Mental Status: He is alert.     Cranial Nerves: No cranial nerve deficit.  Sensory: No sensory deficit.     Gait: Gait normal.     Deep Tendon Reflexes: Reflexes are normal and symmetric.  Psychiatric:        Speech: Speech normal.        Behavior: Behavior normal.        Judgment: Judgment normal.     Diabetic foot exam: Normal inspection No skin breakdown No calluses  Normal DP pulses Normal sensation to light touch and monofilament Nails normal     Results for orders placed or performed in visit on 01/29/22  T3, free  Result Value Ref Range   T3, Free 3.8 2.3 - 4.2 pg/mL  T4, free  Result Value Ref Range   Free T4 1.60 0.60 - 1.60 ng/dL  TSH  Result Value Ref Range   TSH 0.04 (L) 0.35 - 5.50 uIU/mL  PSA, Medicare  Result Value Ref Range   PSA 2.48 0.10 - 4.00 ng/ml  Comprehensive metabolic panel  Result Value Ref Range   Sodium 141 135 - 145 mEq/L   Potassium 4.1 3.5 - 5.1 mEq/L   Chloride 105 96 - 112 mEq/L   CO2 29 19 - 32 mEq/L   Glucose, Bld 100 (H) 70 - 99 mg/dL   BUN 20 6 - 23 mg/dL   Creatinine, Ser 1.09 0.40 - 1.50 mg/dL   Total Bilirubin 1.0 0.2 - 1.2 mg/dL   Alkaline Phosphatase 60 39 - 117 U/L   AST 20 0 - 37 U/L   ALT 18 0 - 53 U/L   Total Protein 6.4 6.0 - 8.3 g/dL   Albumin 4.4 3.5 - 5.2 g/dL   GFR 71.46 >60.00 mL/min   Calcium 9.5 8.4 - 10.5 mg/dL  Microalbumin / creatinine urine ratio  Result Value Ref Range   Microalb, Ur 1.0 0.0 - 1.9 mg/dL   Creatinine,U 218.8 mg/dL   Microalb Creat Ratio 0.5 0.0 - 30.0 mg/g  Lipid panel  Result Value Ref Range   Cholesterol 135 0 - 200 mg/dL   Triglycerides 60.0 0.0 - 149.0 mg/dL   HDL 45.10 >39.00 mg/dL   VLDL 12.0 0.0 - 40.0 mg/dL   LDL Cholesterol 78 0 - 99 mg/dL   Total CHOL/HDL Ratio 3     NonHDL 89.80   Hemoglobin A1c  Result Value Ref Range   Hgb A1c MFr Bld 6.4 4.6 - 6.5 %     COVID 19 screen:  No recent travel or known exposure to COVID19 The patient denies respiratory symptoms of COVID 19 at this time. The importance of social distancing was discussed today.   Assessment and Plan The patient's preventative maintenance and recommended screening tests for an annual wellness exam were reviewed in full today. Brought up to date unless services declined.  Counselled on the importance of diet, exercise, and its role in overall health and mortality. The patient's FH and SH was reviewed, including their home life, tobacco status, and drug and alcohol status.    Vaccines:  given high due  flu, uptodate with td, shingrix, flu. 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   Pain in feet when walking or up on feet.  Problem List Items Addressed This Visit     BPH associated with nocturia (Chronic)    Chronic  moderate control on max flomax.. not interested in treating with additional med.  PSa stable in nml range.      Hypothyroidism (Chronic)    Stable, chronic.  Continue current medication.  Levo 125 mcg daily      Decreased pulses in feet     Hx of CAD, former smoker.  Increase risk for PAD, no family history.  Refer for Bilateral ABIs.      Relevant Orders   VAS [place] LOWER EXT ART SEG MULTI (SEGMENTALS & LE RAYNAUDS)   Hyperlipidemia associated with type 2 diabetes mellitus (HCC)    Stable, chronic.  Continue current medication.  Goal < 70 CAD  Pravastatin 20 mg daily      Hypertension associated with diabetes (HCC)    Stable, chronic.  Continue current medication.  Amlodipine 5 mg p.o. daily      Type 2 diabetes mellitus with other circulatory  complications (HCC)    Chronic, diet controlled. Associated with hypertension.      Other Visit Diagnoses     Routine general medical examination at a health care facility    -  Primary   Need for influenza vaccination       Relevant Orders   Flu Vaccine QUAD High Dose(Fluad) (Completed)       Eliezer Lofts, MD

## 2022-02-05 NOTE — Assessment & Plan Note (Signed)
Stable, chronic.  Continue current medication.  Amlodipine 5 mg p.o. daily

## 2022-02-05 NOTE — Assessment & Plan Note (Signed)
Stable, chronic.  Continue current medication.   Levo 125 mcg daily 

## 2022-02-05 NOTE — Patient Instructions (Addendum)
Find some way to do exercise in cold months.  Call to set up colonoscopy: [facility]  [phone] Dr. Fuller Plan  We will set up blood flow test to the feet.

## 2022-02-05 NOTE — Assessment & Plan Note (Signed)
Chronic moderate control on max flomax.. not interested in treating with additional med.  PSa stable in nml range.

## 2022-02-05 NOTE — Assessment & Plan Note (Signed)
Stable, chronic.  Continue current medication.  Pravastatin 20 mg p.o. daily

## 2022-02-05 NOTE — Assessment & Plan Note (Signed)
Chronic, diet controlled. Associated with hypertension.

## 2022-02-05 NOTE — Assessment & Plan Note (Signed)
Stable, chronic.  Continue current medication.  Goal < 70 CAD  Pravastatin 20 mg daily

## 2022-02-06 ENCOUNTER — Other Ambulatory Visit: Payer: Self-pay

## 2022-02-06 ENCOUNTER — Emergency Department (HOSPITAL_COMMUNITY)
Admission: EM | Admit: 2022-02-06 | Discharge: 2022-02-06 | Disposition: A | Discharge status: Home / Self Care | Payer: PRIVATE HEALTH INSURANCE | Attending: Emergency Medicine | Admitting: Emergency Medicine

## 2022-02-06 ENCOUNTER — Encounter (HOSPITAL_COMMUNITY): Payer: Self-pay

## 2022-02-06 DIAGNOSIS — Z79899 Other long term (current) drug therapy: Secondary | ICD-10-CM | POA: Diagnosis not present

## 2022-02-06 DIAGNOSIS — Z7982 Long term (current) use of aspirin: Secondary | ICD-10-CM | POA: Insufficient documentation

## 2022-02-06 DIAGNOSIS — E876 Hypokalemia: Secondary | ICD-10-CM | POA: Diagnosis not present

## 2022-02-06 DIAGNOSIS — R0602 Shortness of breath: Secondary | ICD-10-CM | POA: Diagnosis not present

## 2022-02-06 DIAGNOSIS — T782XXA Anaphylactic shock, unspecified, initial encounter: Secondary | ICD-10-CM | POA: Diagnosis present

## 2022-02-06 DIAGNOSIS — E039 Hypothyroidism, unspecified: Secondary | ICD-10-CM | POA: Insufficient documentation

## 2022-02-06 DIAGNOSIS — I2510 Atherosclerotic heart disease of native coronary artery without angina pectoris: Secondary | ICD-10-CM | POA: Insufficient documentation

## 2022-02-06 DIAGNOSIS — D649 Anemia, unspecified: Secondary | ICD-10-CM | POA: Diagnosis not present

## 2022-02-06 DIAGNOSIS — E119 Type 2 diabetes mellitus without complications: Secondary | ICD-10-CM | POA: Diagnosis not present

## 2022-02-06 LAB — CBC WITH DIFFERENTIAL/PLATELET
Abs Immature Granulocytes: 0.02 10*3/uL (ref 0.00–0.07)
Basophils Absolute: 0 10*3/uL (ref 0.0–0.1)
Basophils Relative: 0 %
Eosinophils Absolute: 0.2 10*3/uL (ref 0.0–0.5)
Eosinophils Relative: 2 %
HCT: 40.2 % (ref 39.0–52.0)
Hemoglobin: 13.9 g/dL (ref 13.0–17.0)
Immature Granulocytes: 0 %
Lymphocytes Relative: 19 %
Lymphs Abs: 1.8 10*3/uL (ref 0.7–4.0)
MCH: 30 pg (ref 26.0–34.0)
MCHC: 34.6 g/dL (ref 30.0–36.0)
MCV: 86.6 fL (ref 80.0–100.0)
Monocytes Absolute: 0.5 10*3/uL (ref 0.1–1.0)
Monocytes Relative: 5 %
Neutro Abs: 7 10*3/uL (ref 1.7–7.7)
Neutrophils Relative %: 74 %
Platelets: 252 10*3/uL (ref 150–400)
RBC: 4.64 MIL/uL (ref 4.22–5.81)
RDW: 13 % (ref 11.5–15.5)
WBC: 9.5 10*3/uL (ref 4.0–10.5)
nRBC: 0 % (ref 0.0–0.2)

## 2022-02-06 LAB — BASIC METABOLIC PANEL
Anion gap: 9 (ref 5–15)
BUN: 15 mg/dL (ref 8–23)
CO2: 21 mmol/L — ABNORMAL LOW (ref 22–32)
Calcium: 7.5 mg/dL — ABNORMAL LOW (ref 8.9–10.3)
Chloride: 112 mmol/L — ABNORMAL HIGH (ref 98–111)
Creatinine, Ser: 1.1 mg/dL (ref 0.61–1.24)
GFR, Estimated: >60 mL/min (ref >60)
Glucose, Bld: 141 mg/dL — ABNORMAL HIGH (ref 70–99)
Potassium: 2.7 mmol/L — CL (ref 3.5–5.1)
Sodium: 142 mmol/L (ref 135–145)

## 2022-02-06 LAB — TROPONIN I (HIGH SENSITIVITY)
Troponin I (High Sensitivity): 8 ng/L (ref <18)
Troponin I (High Sensitivity): 9 ng/L (ref <18)

## 2022-02-06 MED ORDER — PREDNISONE 10 MG (21) PO TBPK: ORAL_TABLET | Freq: Every day | ORAL | 0 refills | Status: DC

## 2022-02-06 MED ORDER — POTASSIUM CHLORIDE 10 MEQ/100ML IV SOLN
10.0000 meq | Freq: Once | INTRAVENOUS | Status: DC
  Filled 2022-02-06: qty 100

## 2022-02-06 MED ORDER — POTASSIUM CHLORIDE CRYS ER 20 MEQ PO TBCR: 20.0000 meq | EXTENDED_RELEASE_TABLET | Freq: Two times a day (BID) | ORAL | 0 refills | Status: DC

## 2022-02-06 MED ORDER — POTASSIUM CHLORIDE 10 MEQ/100ML IV SOLN
10.0000 meq | INTRAVENOUS | Status: AC
  Administered 2022-02-06: 10 meq via INTRAVENOUS
  Filled 2022-02-06: qty 100

## 2022-02-06 MED ORDER — IPRATROPIUM-ALBUTEROL 0.5-2.5 (3) MG/3ML IN SOLN
3.0000 mL | Freq: Once | RESPIRATORY_TRACT | Status: AC
  Administered 2022-02-06: 3 mL via RESPIRATORY_TRACT
  Filled 2022-02-06: qty 3

## 2022-02-06 MED ORDER — METHYLPREDNISOLONE SODIUM SUCC 125 MG IJ SOLR
125.0000 mg | Freq: Once | INTRAMUSCULAR | Status: AC
  Administered 2022-02-06: 125 mg via INTRAVENOUS
  Filled 2022-02-06: qty 2

## 2022-02-06 MED ORDER — POTASSIUM CHLORIDE CRYS ER 20 MEQ PO TBCR
40.0000 meq | EXTENDED_RELEASE_TABLET | Freq: Once | ORAL | Status: AC
  Administered 2022-02-06: 40 meq via ORAL
  Filled 2022-02-06: qty 2

## 2022-02-06 MED ORDER — FAMOTIDINE IN NACL 20-0.9 MG/50ML-% IV SOLN
20.0000 mg | Freq: Once | INTRAVENOUS | Status: AC
  Administered 2022-02-06: 20 mg via INTRAVENOUS
  Filled 2022-02-06: qty 50

## 2022-02-06 MED ORDER — SODIUM CHLORIDE 0.9 % IV SOLN: Freq: Once | INTRAVENOUS | Status: AC

## 2022-02-06 NOTE — ED Provider Notes (Signed)
[facility] EMERGENCY DEPARTMENT Provider Note   CSN: [id number] Arrival date & time: 02/06/22  0122     History  Chief Complaint  Patient presents with   Allergic Reaction   Shortness of Breath    Brett Lowery is a 66 y.o. male who presents via EMS with concern for Allergic reaction.  Patient states that he got his flu vaccination at 9:30 AM on 12/15, went about his day as normal and went to bed feeling well.  Around 0030, patient woke from sleep with difficulty breathing, very itchy rash across the abdomen and pain in his chest.  Upon EMS arrival patient with hoarseness of voice and wheezing throughout the lung fields bilaterally, coughing.  Was administered 50 IV Benadryl, 1 epinephrine injection into the right deltoid.  EpiPen administered at 0042, approximately 45 minutes prior to my initial evaluation of the patient. patient states that since that time his symptoms have significantly improved.  Chest pain has resolved, cough is improving, patient is no longer itching and his rash is resolving.  Patient with history of anaphylactic reaction a few years ago to unknown allergen.  I personally reviewed his medical records previous history of hypothyroidism, CAD, hypercholesterolemia, BPH, type 2 diabetes, dyslipidemia.  He is not anticoagulated.  HPI     Home Medications Prior to Admission medications   Medication Sig Start Date End Date Taking? Authorizing Provider  potassium chloride SA (KLOR-CON M) 20 MEQ tablet Take 1 tablet (20 mEq total) by mouth 2 (two) times daily for 3 days. 02/06/22 02/09/22 Yes Kay Ricciuti, Gypsy Balsam, PA-C  predniSONE (STERAPRED UNI-PAK 21 TAB) 10 MG (21) TBPK tablet Take by mouth daily. Take 6 tabs by mouth daily  for 1 days, then 5 tabs for 1 days, then 4 tabs for 1 days, then 3 tabs for 1 days, 2 tabs for 1days, then 1 tab by mouth daily for 1 days 02/06/22  Yes Marleah Beever R, PA-C  amLODipine (NORVASC) 5 MG tablet TAKE 1  TABLET BY MOUTH EVERY DAY 01/20/22   Bedsole, Amy E, MD  aspirin 81 MG tablet Take 81 mg by mouth daily.      [provider]  EPINEPHrine 0.3 mg/0.3 mL IJ SOAJ injection SMARTSIG:0.3 Milligram(s) IM Once PRN 10/01/21   Michela Pitcher, NP  ezetimibe (ZETIA) 10 MG tablet TAKE 1 TABLET(10 MG) BY MOUTH DAILY 12/29/21   Bedsole, Amy E, MD  fluticasone (FLONASE) 50 MCG/ACT nasal spray SHAKE LIQUID AND USE 2 SPRAYS IN EACH NOSTRIL DAILY 01/20/22   Bedsole, Amy E, MD  levothyroxine (SYNTHROID) 125 MCG tablet TAKE 1 TABLET(125 MCG) BY MOUTH DAILY BEFORE BREAKFAST 02/24/21   Bedsole, Amy E, MD  pantoprazole (PROTONIX) 40 MG tablet TAKE 1 TABLET(40 MG) BY MOUTH DAILY 02/10/21   Bedsole, Amy E, MD  pravastatin (PRAVACHOL) 20 MG tablet TAKE 1 TABLET(20 MG) BY MOUTH DAILY 02/10/21   Bedsole, Amy E, MD  tamsulosin (FLOMAX) 0.4 MG CAPS capsule TAKE 2 CAPSULES(0.8 MG) BY MOUTH DAILY 01/28/22   Bedsole, Amy E, MD  valACYclovir (VALTREX) 1000 MG tablet Take 2 tablets (2,000 mg total) by mouth 2 (two) times daily. For 1 day. 01/06/21   [name], MD      Allergies    Patient has no known allergies.    Review of Systems   Review of Systems  Constitutional: Negative.   HENT:  Positive for sore throat.   Respiratory:  Positive for cough and shortness of breath.   Gastrointestinal:  Negative for nausea and vomiting.  Skin:  Positive for rash.    Physical Exam Updated Vital Signs BP 124/78   Pulse 73   Temp 99.1 F (37.3 C) (Oral)   Resp 12   Ht '5\' 6"'$  (1.676 m)   Wt 89.6 kg   SpO2 95%   BMI 31.88 kg/m  Physical Exam Vitals and nursing note reviewed.  Constitutional:      Appearance: He is not ill-appearing or toxic-appearing.  HENT:     Head: Normocephalic and atraumatic.     Mouth/Throat:     Mouth: Mucous membranes are moist.     Tongue: Tongue does not deviate from midline.     Pharynx: Oropharynx is clear. Uvula midline. No oropharyngeal exudate or posterior oropharyngeal erythema.       Comments: No sublingual or submental tenderness palpation.  No swelling of the lips Eyes:     General: Lids are normal. Vision grossly intact.        Right eye: No discharge.        Left eye: No discharge.     Conjunctiva/sclera:     Right eye: Right conjunctiva is injected.     Pupils: Pupils are equal, round, and reactive to light.     Comments: Per patient "scratched I couple days ago" on the right with improving conjunctival injection  Neck:     Trachea: Trachea and phonation normal.  Cardiovascular:     Rate and Rhythm: Normal rate and regular rhythm.     Pulses: Normal pulses.     Heart sounds: Normal heart sounds. No murmur heard. Pulmonary:     Effort: Pulmonary effort is normal. No tachypnea, bradypnea, accessory muscle usage, prolonged expiration or respiratory distress.     Breath sounds: Normal breath sounds. No wheezing or rales.  Chest:     Chest wall: No mass, lacerations, deformity, swelling, tenderness or edema.  Abdominal:     General: Bowel sounds are normal. There is no distension.     Palpations: Abdomen is soft.     Tenderness: There is no abdominal tenderness.  Musculoskeletal:        General: No deformity.     Cervical back: Normal range of motion and neck supple.     Right lower leg: No edema.     Left lower leg: No edema.  Lymphadenopathy:     Cervical: No cervical adenopathy.  Skin:    General: Skin is warm and dry.     Capillary Refill: Capillary refill takes less than 2 seconds.     Findings: Rash present.     Comments: Uritcarial rash over the lower abdomen and upper chest, as well as bilateral LE per patient resolving.   Neurological:     Mental Status: He is alert and oriented to person, place, and time. Mental status is at baseline.  Psychiatric:        Mood and Affect: Mood normal.     ED Results / Procedures / Treatments   Labs (all labs ordered are listed, but only abnormal results are displayed) Labs Reviewed  BASIC METABOLIC  PANEL - Abnormal; Notable for the following components:      Result Value   Potassium 2.7 (*)    Chloride 112 (*)    CO2 21 (*)    Glucose, Bld 141 (*)    Calcium 7.5 (*)    All other components within normal limits  CBC WITH DIFFERENTIAL/PLATELET  TROPONIN I (HIGH SENSITIVITY)  TROPONIN I (HIGH  SENSITIVITY)    EKG None  Radiology No results found.  Procedures Procedures    Medications Ordered in ED Medications  potassium chloride 10 mEq in 100 mL IVPB (10 mEq Intravenous Not Given 02/06/22 0531)  potassium chloride 10 mEq in 100 mL IVPB (10 mEq Intravenous Not Given 02/06/22 0532)  methylPREDNISolone sodium succinate (SOLU-MEDROL) 125 mg/2 mL injection 125 mg (125 mg Intravenous Given 02/06/22 0217)  famotidine (PEPCID) IVPB 20 mg premix (0 mg Intravenous Stopped 02/06/22 0247)  ipratropium-albuterol (DUONEB) 0.5-2.5 (3) MG/3ML nebulizer solution 3 mL (3 mLs Nebulization Given 02/06/22 0247)  potassium chloride SA (KLOR-CON M) CR tablet 40 mEq (40 mEq Oral Given 02/06/22 0420)  0.9 %  sodium chloride infusion (0 mLs Intravenous Stopped 02/06/22 0531)    ED Course/ Medical Decision Making/ A&P Clinical Course as of 02/06/22 0701  Sat Feb 06, 2022  0231 Called to the bedside by [facility] for concern of patient reporting chest tightness. Lungs CTAB, will trial duoneb and reevaluate.  [RS]    Clinical Course User Index [RS] Baltazar Pekala, Gypsy Balsam, PA-C                           Medical Decision Making 66 year old male presents with concern for anaphylactic reaction with shortness of breath, rash, sore throat and hoarseness of voice.  Vital signs are normal and intake.  Cardiopulmonary exam is normal, abdominal exam is benign.  Oropharyngeal exam with mild uvular edema without deviation, patient speaking without hoarseness of voice.  Resolving urticaria on the upper chest, lower abdomen and bilateral lower extremities anteriorly. DDx includes limited to anaphylaxis/allergic  reaction, angioedema, asthma exacerbation.  Amount and/or Complexity of Data Reviewed Labs: ordered.    Details: Anemia,CBC without leukocytosis BMP with critical hypokalemia to 2.7, no anion gap, troponin is negative x 2.  EKG with sinus rhythm without STEMI.  Risk Prescription drug management.   Patient reevaluated, complete resolution of all of his symptoms, doing well at this time following 4-hour observation.  After epi administration.  Potassium repleted both orally and through IV, discharged with prescription for potassium supplementation as well as steroid taper.  Patient has up-to-date epinephrine pen at home.  No further workup warranted in the ED this time.  No indication for admission at this time.  Brett Lowery  voiced understanding of his medical evaluation and treatment plan. Each of their questions answered to their expressed satisfaction.  Return precautions were given.  Patient is well-appearing, stable, and was discharged in good condition.  This chart was dictated using voice recognition software, Dragon. Despite the best efforts of this provider to proofread and correct errors, errors may still occur which can change documentation meaning.  Final Clinical Impression(s) / ED Diagnoses Final diagnoses:  Anaphylaxis, initial encounter  Hypokalemia    Rx / DC Orders ED Discharge Orders          Ordered    potassium chloride SA (KLOR-CON M) 20 MEQ tablet  2 times daily        02/06/22 0512    predniSONE (STERAPRED UNI-PAK 21 TAB) 10 MG (21) TBPK tablet  Daily        02/06/22 0512              Darrall Strey, Gypsy Balsam, PA-C 02/06/22 0701    Merrily Pew, MD 02/06/22 2336

## 2022-02-06 NOTE — Discharge Instructions (Addendum)
You were seen here today for your allergic reaction.  You are treated with an EpiPen.  Please keep your EpiPen on your person at all times to use and incidences like today we have severe allergic reaction symptoms.  Please take Benadryl around-the-clock for the next 48 hours. Transition to another antihistamine such as Zyrtec or Claritin for the next 5 days.  Please take the prescribed steroid for the next week.  Follow-up with your primary care doctor for recheck of your low potassium and take the prescribed potassium supplement for the next 3 days.  Return to the ER with any new severe symptoms.

## 2022-02-06 NOTE — ED Notes (Signed)
ED provider at bedside.

## 2022-02-06 NOTE — ED Triage Notes (Signed)
BIB [facility] from home with c/o SOB and possiple allergic reaction. Flu vaccine yesterday unsure if this caused it. Woke up this morning very itchy, developed CP and SOB. EMS heard wheezing on arrival gave pt Epi, started PIV gave benadryl, noticed hives with itching. After meds improved no more wheezing, cough has decreased, not as itchy, mild CP inferior at the throat.

## 2022-02-08 ENCOUNTER — Other Ambulatory Visit: Payer: Self-pay | Admitting: Family Medicine

## 2022-02-10 ENCOUNTER — Encounter: Payer: Self-pay | Admitting: Family Medicine

## 2022-02-10 ENCOUNTER — Ambulatory Visit: Payer: PRIVATE HEALTH INSURANCE | Admitting: Family Medicine

## 2022-02-10 VITALS — BP 120/70 | HR 65 | Temp 97.0°F | Ht 66.75 in | Wt 200.0 lb

## 2022-02-10 DIAGNOSIS — E876 Hypokalemia: Secondary | ICD-10-CM | POA: Insufficient documentation

## 2022-02-10 DIAGNOSIS — T782XXD Anaphylactic shock, unspecified, subsequent encounter: Secondary | ICD-10-CM | POA: Diagnosis not present

## 2022-02-10 LAB — BASIC METABOLIC PANEL
BUN: 21 mg/dL (ref 6–23)
CO2: 32 mEq/L (ref 19–32)
Calcium: 9.5 mg/dL (ref 8.4–10.5)
Chloride: 101 mEq/L (ref 96–112)
Creatinine, Ser: 1.08 mg/dL (ref 0.40–1.50)
GFR: 72.24 mL/min (ref >60.00)
Glucose, Bld: 143 mg/dL — ABNORMAL HIGH (ref 70–99)
Potassium: 4.3 mEq/L (ref 3.5–5.1)
Sodium: 140 mEq/L (ref 135–145)

## 2022-02-10 LAB — MAGNESIUM: Magnesium: 1.9 mg/dL (ref 1.5–2.5)

## 2022-02-10 NOTE — Progress Notes (Signed)
Patient ID: Brett Lowery, male    DOB: 1955-10-10, 66 y.o.   MRN: [id number]  This visit was conducted in person.  BP 120/70 (BP Location: Left Arm, Patient Position: Sitting)   Pulse 65   Temp (!) 97 F (36.1 C) (Skin)   Ht 5' 6.75" (1.695 m)   Wt 200 lb (90.7 kg)   SpO2 97%   BMI 31.56 kg/m    CC:  Chief Complaint  Patient presents with   Follow-up    ER visit    Subjective:   HPI: Brett Lowery is a 66 y.o. male presenting on 02/10/2022 for Follow-up (ER visit)  Reviewed ER note from 02/06/2022 when patient was seen for anaphylaxis allergic reaction causing shortness of breath and hypokalemia. Oral swelling in throat.    He was doing well until his flu vaccination on 1215.  Later that night he had hoarseness of voice and wheezing  In ER he received Benadryl, epinephrine with significant improvement. K was found to be 2.7.  EKG unremarkable, troponin negative x 2 Given IV fluids and potassium. Discharged with prednisone taper.  Today he reports  improved SOB.. he has only mild chest tightness at this point, mild SOB with exertion. No lip swelling, no tongue swelling now.  Finished prednisone yesterday.    Of note [place] beach [place].. unknown trigger.  No new foods, no new meds   Relevant past medical, surgical, family and social history reviewed and updated as indicated. Interim medical history since our last visit reviewed. Allergies and medications reviewed and updated. Outpatient Medications Prior to Visit  Medication Sig Dispense Refill   amLODipine (NORVASC) 5 MG tablet TAKE 1 TABLET BY MOUTH EVERY DAY 90 tablet 0   aspirin 81 MG tablet Take 81 mg by mouth daily.       EPINEPHrine 0.3 mg/0.3 mL IJ SOAJ injection SMARTSIG:0.3 Milligram(s) IM Once PRN 1 each 0   ezetimibe (ZETIA) 10 MG tablet TAKE 1 TABLET(10 MG) BY MOUTH DAILY 90 tablet 0   fluticasone (FLONASE) 50 MCG/ACT nasal spray SHAKE LIQUID AND USE 2 SPRAYS IN EACH  NOSTRIL DAILY 48 g 0   levothyroxine (SYNTHROID) 125 MCG tablet TAKE 1 TABLET(125 MCG) BY MOUTH DAILY BEFORE BREAKFAST 90 tablet 3   pantoprazole (PROTONIX) 40 MG tablet TAKE 1 TABLET(40 MG) BY MOUTH DAILY 90 tablet 3   pravastatin (PRAVACHOL) 20 MG tablet TAKE 1 TABLET(20 MG) BY MOUTH DAILY 90 tablet 3   predniSONE (STERAPRED UNI-PAK 21 TAB) 10 MG (21) TBPK tablet Take by mouth daily. Take 6 tabs by mouth daily  for 1 days, then 5 tabs for 1 days, then 4 tabs for 1 days, then 3 tabs for 1 days, 2 tabs for 1days, then 1 tab by mouth daily for 1 days 21 tablet 0   tamsulosin (FLOMAX) 0.4 MG CAPS capsule TAKE 2 CAPSULES(0.8 MG) BY MOUTH DAILY 180 capsule 0   valACYclovir (VALTREX) 1000 MG tablet Take 2 tablets (2,000 mg total) by mouth 2 (two) times daily. For 1 day. 20 tablet 1   potassium chloride SA (KLOR-CON M) 20 MEQ tablet Take 1 tablet (20 mEq total) by mouth 2 (two) times daily for 3 days. 6 tablet 0   No facility-administered medications prior to visit.     Per HPI unless specifically indicated in ROS section below Review of Systems  Constitutional:  Negative for fatigue and fever.  HENT:  Negative for ear pain.   Eyes:  Negative for pain.  Respiratory:  Positive for chest tightness and shortness of breath. Negative for cough.   Cardiovascular:  Negative for chest pain, palpitations and leg swelling.  Gastrointestinal:  Negative for abdominal pain.  Genitourinary:  Negative for dysuria.  Musculoskeletal:  Negative for arthralgias.  Neurological:  Negative for syncope, light-headedness and headaches.  Psychiatric/Behavioral:  Negative for dysphoric mood.    Objective:  BP 120/70 (BP Location: Left Arm, Patient Position: Sitting)   Pulse 65   Temp (!) 97 F (36.1 C) (Skin)   Ht 5' 6.75" (1.695 m)   Wt 200 lb (90.7 kg)   SpO2 97%   BMI 31.56 kg/m   Wt Readings from Last 3 Encounters:  02/10/22 200 lb (90.7 kg)  02/06/22 197 lb 8 oz (89.6 kg)  02/05/22 197 lb 8 oz (89.6 kg)       Physical Exam Constitutional:      Appearance: He is well-developed.  HENT:     Head: Normocephalic.     Comments: No oral swelling    Right Ear: Hearing normal.     Left Ear: Hearing normal.     Nose: Nose normal.  Neck:     Thyroid: No thyroid mass or thyromegaly.     Vascular: No carotid bruit.     Trachea: Trachea normal.  Cardiovascular:     Rate and Rhythm: Normal rate and regular rhythm.     Pulses: Normal pulses.     Heart sounds: Heart sounds not distant. No murmur heard.    No friction rub. No gallop.     Comments: No peripheral edema Pulmonary:     Effort: Pulmonary effort is normal. No respiratory distress.     Breath sounds: Normal breath sounds.  Skin:    General: Skin is warm and dry.     Findings: No rash.  Psychiatric:        Speech: Speech normal.        Behavior: Behavior normal.        Thought Content: Thought content normal.       Results for orders placed or performed during the hospital encounter of 02/06/22  CBC with Differential  Result Value Ref Range   WBC 9.5 4.0 - 10.5 K/uL   RBC 4.64 4.22 - 5.81 MIL/uL   Hemoglobin 13.9 13.0 - 17.0 g/dL   HCT 40.2 39.0 - 52.0 %   MCV 86.6 80.0 - 100.0 fL   MCH 30.0 26.0 - 34.0 pg   MCHC 34.6 30.0 - 36.0 g/dL   RDW 13.0 11.5 - 15.5 %   Platelets 252 150 - 400 K/uL   nRBC 0.0 0.0 - 0.2 %   Neutrophils Relative % 74 %   Neutro Abs 7.0 1.7 - 7.7 K/uL   Lymphocytes Relative 19 %   Lymphs Abs 1.8 0.7 - 4.0 K/uL   Monocytes Relative 5 %   Monocytes Absolute 0.5 0.1 - 1.0 K/uL   Eosinophils Relative 2 %   Eosinophils Absolute 0.2 0.0 - 0.5 K/uL   Basophils Relative 0 %   Basophils Absolute 0.0 0.0 - 0.1 K/uL   Immature Granulocytes 0 %   Abs Immature Granulocytes 0.02 0.00 - 0.07 K/uL  Basic metabolic panel  Result Value Ref Range   Sodium 142 135 - 145 mmol/L   Potassium 2.7 (LL) 3.5 - 5.1 mmol/L   Chloride 112 (H) 98 - 111 mmol/L   CO2 21 (L) 22 - 32 mmol/L   Glucose, Bld 141 (H)  70 - 99  mg/dL   BUN 15 8 - 23 mg/dL   Creatinine, Ser 1.10 0.61 - 1.24 mg/dL   Calcium 7.5 (L) 8.9 - 10.3 mg/dL   GFR, Estimated >60 >60 mL/min   Anion gap 9 5 - 15  Troponin I (High Sensitivity)  Result Value Ref Range   Troponin I (High Sensitivity) 8 <18 ng/L  Troponin I (High Sensitivity)  Result Value Ref Range   Troponin I (High Sensitivity) 9 <18 ng/L     COVID 19 screen:  No recent travel or known exposure to COVID19 The patient denies respiratory symptoms of COVID 19 at this time. The importance of social distancing was discussed today.   Assessment and Plan   Problem List Items Addressed This Visit     Anaphylactic syndrome - Primary    Second occurrence with unclear etiology.  After first incident he saw allergist for allergy workup that showed no specific allergens.  This time the only obvious trigger would be flu vaccine but he has tolerated at the lower doses in the past.  This year he did receive high-dose flu vaccine.  His symptoms are improving day-to-day at this point.  We reviewed reasons to potentially restart a longer prednisone taper. He has an EpiPen available in 2 locations.  He will start daily preventative antihistamine.  He will consider calling the allergist for additional recommendations.      Hypokalemia    No other clear cause.  May potentially be due to anaphylaxis itself as there seems to be some association.  Has been repleted in the ER.  Will recheck today to make sure back in the normal range.      Relevant Orders   Basic Metabolic Panel   Magnesium     Eliezer Lofts, MD

## 2022-02-10 NOTE — Assessment & Plan Note (Signed)
Second occurrence with unclear etiology.  After first incident he saw allergist for allergy workup that showed no specific allergens.  This time the only obvious trigger would be flu vaccine but he has tolerated at the lower doses in the past.  This year he did receive high-dose flu vaccine.  His symptoms are improving day-to-day at this point.  We reviewed reasons to potentially restart a longer prednisone taper. He has an EpiPen available in 2 locations.  He will start daily preventative antihistamine.  He will consider calling the allergist for additional recommendations.

## 2022-02-10 NOTE — Patient Instructions (Addendum)
Please stop at the lab to have labs drawn. Start Zyrtec at bedtime.  Call if any recurring  or worsening of chest tightness/shortness of breath.. for possible repeat prednisone taper.  Keep Epi Pen handy.  Consider follow up with allergist.

## 2022-02-10 NOTE — Assessment & Plan Note (Signed)
No other clear cause.  May potentially be due to anaphylaxis itself as there seems to be some association.  Has been repleted in the ER.  Will recheck today to make sure back in the normal range.

## 2022-02-11 ENCOUNTER — Encounter: Payer: Self-pay | Admitting: Family Medicine

## 2022-02-19 ENCOUNTER — Other Ambulatory Visit: Payer: Self-pay | Admitting: Family Medicine

## 2022-04-01 ENCOUNTER — Other Ambulatory Visit: Payer: Self-pay | Admitting: Family Medicine

## 2022-04-20 ENCOUNTER — Other Ambulatory Visit: Payer: Self-pay | Admitting: Family Medicine

## 2022-04-27 ENCOUNTER — Other Ambulatory Visit: Payer: Self-pay | Admitting: Family Medicine

## 2022-04-30 ENCOUNTER — Ambulatory Visit: Payer: PRIVATE HEALTH INSURANCE | Admitting: Family Medicine

## 2022-04-30 ENCOUNTER — Telehealth: Payer: Self-pay

## 2022-04-30 ENCOUNTER — Encounter: Payer: Self-pay | Admitting: Family Medicine

## 2022-04-30 VITALS — BP 124/70 | HR 77 | Temp 97.8°F | Ht 66.75 in | Wt 207.3 lb

## 2022-04-30 DIAGNOSIS — J209 Acute bronchitis, unspecified: Secondary | ICD-10-CM | POA: Diagnosis not present

## 2022-04-30 MED ORDER — BENZONATATE 100 MG PO CAPS: 100.0000 mg | ORAL_CAPSULE | Freq: Three times a day (TID) | ORAL | 0 refills | Status: DC | PRN

## 2022-04-30 NOTE — Progress Notes (Signed)
Established Patient Office Visit  Subjective   Patient ID: Brett Lowery, male    DOB: 09/30/1955  Age: 67 y.o. MRN: [id number]  Chief Complaint  Patient presents with   Cough    Patient complains of cough, x4 days, Tried Mucinex with little relief    Shortness of Breath    Patient complains of shortness of breath upon exertion          HPI   Brett Lowery is seen with onset Monday of a "tickle "in his throat and upper airway area.  He had some progressive chest congestion since then.  Very little nasal congestion.  No fever.  No chills.  No dyspnea.  Denies any facial pain.  Denies any sore throat at this time.  No nausea, vomiting, or diarrhea.  Has taken over-the-counter Mucinex which he thinks has helped slightly.  Non-smoker.  No chronic heart or lung issues.  No known sick contacts.  Past Medical History:  Diagnosis Date   CAD (coronary artery disease)    Chest pain    Disorders of bursae and tendons in shoulder region, unspecified    R, tendonitis   DM (diabetes mellitus) ([facility])    w/o complications   Dyslipidemia    Eczema    GERD (gastroesophageal reflux disease)    HTN (hypertension)    Hypercholesterolemia    Hypothyroidism    Insomnia    Serrated adenoma of colon 2009   Special screening for malignant neoplasm of prostate    Special screening for malignant neoplasms, colon    Well adult exam    Past Surgical History:  Procedure Laterality Date   CARDIAC CATHETERIZATION  2009   shoed no obstructive CAD, medical management    CLOSED MANIPULATION SHOULDER WITH STERIOD INJECTION Left 01/18/2017   Procedure: CLOSED MANIPULATION SHOULDER WITH STEROID INJECTION;  Surgeon: Renette Butters, MD;  Location: [facility];  Service: Orthopedics;  Laterality: Left;   cornia transplants  2003   DG ESOPHAGUS -BA SW  2005   mod reflux and hiatal hernia   HERNIA REPAIR  2002   THYROIDECTOMY  2004   thyroid nodile    reports that he quit smoking about 21  years ago. His smoking use included cigarettes. He has never used smokeless tobacco. He reports that he does not drink alcohol and does not use drugs. family history includes Allergic rhinitis in his maternal grandfather. No Known Allergies  Review of Systems  Constitutional:  Negative for chills and fever.  HENT:  Negative for sinus pain and sore throat.   Respiratory:  Positive for cough and sputum production. Negative for hemoptysis, shortness of breath and wheezing.   Cardiovascular:  Negative for chest pain.      Objective:     BP 124/70 (BP Location: Left Arm, Patient Position: Sitting, Cuff Size: Normal)   Pulse 77   Temp 97.8 F (36.6 C) (Oral)   Ht 5' 6.75" (1.695 m)   Wt 207 lb 4.8 oz (94 kg)   SpO2 97%   BMI 32.71 kg/m    Physical Exam Vitals reviewed.  Constitutional:      General: He is not in acute distress.    Appearance: He is well-developed. He is not ill-appearing.  Cardiovascular:     Rate and Rhythm: Normal rate and regular rhythm.  Pulmonary:     Comments: Lungs are clear to auscultation throughout.  Symmetric breath sounds.  No wheezes.  No rales.  O2 sat 97% room air.  Musculoskeletal:     Cervical back: Neck supple.  Lymphadenopathy:     Cervical: No cervical adenopathy.  Neurological:     Mental Status: He is alert.      No results found for any visits on 04/30/22.    The 10-year ASCVD risk score (Arnett DK, et al., 2019) is: 28%    Assessment & Plan:   Cough.  Suspect acute viral bronchitis.  Nonfocal exam.  Recommend trial of Tessalon Perles 100 mg every 8 hours as needed for cough.  Follow-up immediately for any fever or increased shortness of breath.  Continue over-the-counter Mucinex as needed.   Carolann Littler, MD

## 2022-04-30 NOTE — Telephone Encounter (Signed)
Reviewed patient chart has been scheduled with Dr. Elease Hashimoto today.   Patient Name: Brett Lowery EWS Gender: Male DOB: [birth year] Age: 67 Y 3 M 9 D Return Phone Number: [phone] (Primary) Address: City/ State/ Zip: [place] [address] Client [facility] Night - Client Client Site [place] Provider Eliezer Lofts - MD Contact Type Call Who Is Calling Patient / Member / Family / Caregiver Call Type Triage / Clinical Relationship To Patient Self Return Phone Number [phone] (Primary) Chief Complaint CHEST PAIN - pain, pressure, heaviness or tightness Reason for Call Symptomatic / Request for [facility] states he has chest tightness and coughing up phlegm. Translation No Nurse Assessment Nurse: Toy Cookey, RN, [facility] Date/Time Eilene Ghazi Time): 04/30/2022 7:21:34 AM Confirm and document reason for call. If symptomatic, describe symptoms. ---Caller states he has chest tightness and coughing up phlegm. Started a couple of days ago. Taking Mucinex. No fever. Does the patient have any new or worsening symptoms? ---Yes Will a triage be completed? ---Yes Related visit to physician within the last 2 weeks? ---No Does the PT have any chronic conditions? (i.e. diabetes, asthma, this includes High risk factors for pregnancy, etc.) ---No Is this a behavioral health or substance abuse call? ---No Guidelines Guideline Title Affirmed Question Affirmed Notes Nurse Date/Time Eilene Ghazi Time) Chest Pain [1] Chest pain lasts > 5 minutes AND [2] age > 77 Bonnetta Barry 04/30/2022 7:23:01 AM Disp. Time Eilene Ghazi Time) Disposition Final User 04/30/2022 7:19:36 AM Send to Urgent Queue Brandonmichael, [place] 04/30/2022 7:25:06 AM Call EMS 911 Now Yes Toy Cookey RN, [facility] Final Disposition 04/30/2022 7:25:06 AM Call EMS 911 Now Yes Toy Cookey, RN, [facility] PLEASE NOTE: All timestamps contained within this report are represented  as [place] Standard Time. CONFIDENTIALTY NOTICE: This fax transmission is intended only for the addressee. It contains information that is legally privileged, confidential or otherwise protected from use or disclosure. If you are not the intended recipient, you are strictly prohibited from reviewing, disclosing, copying using or disseminating any of this information or taking any action in reliance on or regarding this information. If you have received this fax in error, please notify us immediately by telephone so that we can arrange for its return to [place]. Phone: [phone], Toll-Free: [phone], Fax: [phone] Page: 2 of 2 Call Id: [id number] [place] Disagree/Comply Disagree Caller Understands Yes PreDisposition Call Doctor Care Advice Given Per Guideline CALL EMS 911 NOW: * Immediate medical attention is needed. You need to hang up and call 911 (or an ambulance). NOTE TO TRIAGER - IF CALLER ASKS ABOUT ASPIRIN: * Call EMS 911 first. Comments User: Dulcy Fanny, RN Date/Time Eilene Ghazi Time): 04/30/2022 7:26:19 AM Caller does not want to go to ED. Prefers to see his PCP this morning. Will call back for appointment if office does not reach out to him first. Referrals REFERRED TO PCP OFFICE

## 2022-09-08 ENCOUNTER — Ambulatory Visit: Payer: PRIVATE HEALTH INSURANCE | Admitting: Primary Care

## 2022-09-08 VITALS — BP 118/60 | HR 81 | Temp 97.6°F | Ht 66.75 in | Wt 199.0 lb

## 2022-09-08 DIAGNOSIS — M778 Other enthesopathies, not elsewhere classified: Secondary | ICD-10-CM | POA: Diagnosis not present

## 2022-09-08 MED ORDER — PREDNISONE 20 MG PO TABS: ORAL_TABLET | ORAL | 0 refills | Status: DC

## 2022-09-08 NOTE — Progress Notes (Signed)
Subjective:    Patient ID: Brett Lowery, male    DOB: February 14, 1956, 67 y.o.   MRN: [id number]  Wrist Pain  Pertinent negatives include no numbness.    Brett Lowery is a very pleasant 67 y.o. male patient of Dr. Ermalene Searing with a history of type 2 diabetes, hypertension, hypothyroidism, chest pain, chronic neck pain who presents today to discuss wrist pain.  His pain is located to the right lateral wrist which originally began 2-3 weeks ago. His pain occurs only with movement such as eating, waving, shaking hands, any lateral movement. His pain does radiate up his right lateral forearm.   He denies injury/trauma, redness, swelling, numbness. He works with wrenches, drills, and tools, drills, is right handed. 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  He's not taken anything OTC for symptoms.  He has no prior history of gout.   Review of Systems  Musculoskeletal:  Positive for arthralgias. Negative for joint swelling.  Skin:  Negative for color change.  Neurological:  Negative for weakness and numbness.         Past Medical History:  Diagnosis Date   CAD (coronary artery disease)    Chest pain    Disorders of bursae and tendons in shoulder region, unspecified    R, tendonitis   DM (diabetes mellitus) ([facility])    w/o complications   Dyslipidemia    Eczema    GERD (gastroesophageal reflux disease)    HTN (hypertension)    Hypercholesterolemia    Hypothyroidism    Insomnia    Serrated adenoma of colon 2009   Special screening for malignant neoplasm of prostate    Special screening for malignant neoplasms, colon    Well adult exam     Social History   Socioeconomic History   Marital status: Married    Spouse name: Not on file   Number of children: Not on file   Years of education: Not on file   Highest education level: Not on file  Occupational History   Not on file  Tobacco Use   Smoking status: Former    Current packs/day: 0.00    Types: Cigarettes     Quit date: 03/28/2001    Years since quitting: 21.4   Smokeless tobacco: Never   Tobacco comments:    40 pack year hx   Vaping Use   Vaping status: Never Used  Substance and Sexual Activity   Alcohol use: No   Drug use: No   Sexual activity: Not on file  Other Topics Concern   Not on file  Social History Narrative   Married x30 years; 2 daughters 5-23   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   Diet: fruit and veggies. Occ. Fast food on weekends.       designated party release form signed authorizing Elease Hashimoto C. Feuerborn (wife) [phone]. Daine Gip 12/12/09.    Social Determinants of Health   Financial Resource Strain: Not on file  Food Insecurity: Not on file  Transportation Needs: Not on file  Physical Activity: Not on file  Stress: Not on file  Social Connections: Not on file  Intimate Partner Violence: Not on file    Past Surgical History:  Procedure Laterality Date   CARDIAC CATHETERIZATION  2009   shoed no obstructive CAD, medical management    CLOSED MANIPULATION SHOULDER WITH STERIOD INJECTION Left 01/18/2017   Procedure: CLOSED MANIPULATION SHOULDER WITH STEROID INJECTION;  Surgeon: Sheral Apley, MD;  Location: [facility];  Service: Orthopedics;  Laterality: Left;   cornia transplants  2003   DG ESOPHAGUS -BA SW  2005   mod reflux and hiatal hernia   HERNIA REPAIR  2002   THYROIDECTOMY  2004   thyroid nodile    Family History  Problem Relation Age of Onset   Allergic rhinitis Maternal Grandfather    Heart attack Neg Hx        < age 26   Colon cancer Neg Hx    Esophageal cancer Neg Hx    Rectal cancer Neg Hx    Stomach cancer Neg Hx    Asthma Neg Hx    Eczema Neg Hx    Urticaria Neg Hx     No Known Allergies  Current Outpatient Medications on File Prior to Visit  Medication Sig Dispense Refill   amLODipine (NORVASC) 5 MG tablet TAKE 1 TABLET BY MOUTH EVERY DAY 90 tablet 1   aspirin 81 MG tablet Take  81 mg by mouth daily.       dexlansoprazole (DEXILANT) 60 MG capsule TAKE 1 CAPSULE(60 MG) BY MOUTH DAILY     EPINEPHrine 0.3 mg/0.3 mL IJ SOAJ injection SMARTSIG:0.3 Milligram(s) IM Once PRN 1 each 0   ezetimibe (ZETIA) 10 MG tablet TAKE 1 TABLET(10 MG) BY MOUTH DAILY 90 tablet 3   fluticasone (FLONASE) 50 MCG/ACT nasal spray SHAKE LIQUID AND USE 2 SPRAYS IN EACH NOSTRIL DAILY 48 g 1   levothyroxine (SYNTHROID) 125 MCG tablet TAKE 1 TABLET(125 MCG) BY MOUTH DAILY BEFORE BREAKFAST 90 tablet 3   pantoprazole (PROTONIX) 40 MG tablet TAKE 1 TABLET(40 MG) BY MOUTH DAILY 90 tablet 3   pravastatin (PRAVACHOL) 20 MG tablet TAKE 1 TABLET(20 MG) BY MOUTH DAILY 90 tablet 3   tamsulosin (FLOMAX) 0.4 MG CAPS capsule TAKE 2 CAPSULES(0.8 MG) BY MOUTH DAILY 180 capsule 2   valACYclovir (VALTREX) 1000 MG tablet Take 2 tablets (2,000 mg total) by mouth 2 (two) times daily. For 1 day. 20 tablet 1   No current facility-administered medications on file prior to visit.    BP 118/60   Pulse 81   Temp 97.6 F (36.4 C) (Temporal)   Ht 5' 6.75" (1.695 m)   Wt 199 lb (90.3 kg)   SpO2 96%   BMI 31.40 kg/m  Objective:   Physical Exam Cardiovascular:     Pulses:          Radial pulses are 2+ on the right side.  Musculoskeletal:     Right wrist: Tenderness present. No swelling or deformity. Decreased range of motion. Normal pulse.     Comments: Pain with medial and lateral wrist rotation.  Slight decrease in range of motion due to pain with medial and lateral wrist rotation.  Skin:    General: Skin is warm and dry.     Findings: No erythema.           Assessment & Plan:  Tendonitis of wrist, right Assessment & Plan: HPI and exam suggestive of tendinitis vs arthritis flare, especially given long history of overuse.  No signs of gout. Does not appear to be carpal tunnel syndrome.  Treat with prednisone taper.  60 mg x 3 days, 40 mg x 3 days, 20 mg x 3 days.  Discussed potential side effects.  We  also discussed to obtain a wrist brace for support and to rest the rest when able. We discussed that it may take several weeks for his symptoms to  resolve.  Follow-up with sports medicine if no improvement.  Orders: -     predniSONE; Take 3 tablets my mouth once daily in the morning for 3 days, then 2 tablets for 3 days, then 1 tablet for 3 days.  Dispense: 18 tablet; Refill: 0        Doreene Nest, NP

## 2022-09-08 NOTE — Patient Instructions (Signed)
Start prednisone tablets for inflammation and pain.  Take 3 tablets my mouth once daily in the morning for 3 days, then 2 tablets for 3 days, then 1 tablet for 3 days.  Purchase a wrist brace over-the-counter to help stable your wrist.  Try to rest when able.  Schedule an appointment with Dr. Patsy Lager, our sports medicine doctor, in 1 week if no improvement.  It was a pleasure meeting you!

## 2022-09-08 NOTE — Assessment & Plan Note (Addendum)
HPI and exam suggestive of tendinitis vs arthritis flare, especially given long history of overuse.  No signs of gout. Does not appear to be carpal tunnel syndrome.  Treat with prednisone taper.  60 mg x 3 days, 40 mg x 3 days, 20 mg x 3 days.  Discussed potential side effects.  We also discussed to obtain a wrist brace for support and to rest the rest when able. We discussed that it may take several weeks for his symptoms to resolve.  Follow-up with sports medicine if no improvement.

## 2022-10-17 ENCOUNTER — Other Ambulatory Visit: Payer: Self-pay | Admitting: Family Medicine

## 2023-01-15 ENCOUNTER — Other Ambulatory Visit: Payer: Self-pay | Admitting: Family Medicine

## 2023-01-17 NOTE — Telephone Encounter (Signed)
LVM for patient to c/b and schedule.  

## 2023-01-17 NOTE — Telephone Encounter (Signed)
Please schedule CPE with fasting labs prior with Dr. Ermalene Searing after 02/06/2023.

## 2023-01-18 NOTE — Telephone Encounter (Signed)
Spoke to pt, scheduled cpe for 03/09/23

## 2023-01-24 ENCOUNTER — Other Ambulatory Visit: Payer: Self-pay | Admitting: Family Medicine

## 2023-02-09 ENCOUNTER — Other Ambulatory Visit: Payer: Self-pay | Admitting: Family Medicine

## 2023-02-17 ENCOUNTER — Telehealth: Payer: Self-pay | Admitting: *Deleted

## 2023-02-17 DIAGNOSIS — E1169 Type 2 diabetes mellitus with other specified complication: Secondary | ICD-10-CM

## 2023-02-17 DIAGNOSIS — Z125 Encounter for screening for malignant neoplasm of prostate: Secondary | ICD-10-CM

## 2023-02-17 DIAGNOSIS — E890 Postprocedural hypothyroidism: Secondary | ICD-10-CM

## 2023-02-17 DIAGNOSIS — E1159 Type 2 diabetes mellitus with other circulatory complications: Secondary | ICD-10-CM

## 2023-02-17 NOTE — Telephone Encounter (Signed)
-----   Message from Alvina Chou sent at 02/17/2023  2:55 PM EST ----- Regarding: Lab order for Tue, 1.7.25 Patient is scheduled for CPX labs, please order future labs, Thanks , Camelia Eng

## 2023-02-18 ENCOUNTER — Other Ambulatory Visit: Payer: PRIVATE HEALTH INSURANCE

## 2023-02-19 ENCOUNTER — Other Ambulatory Visit: Payer: Self-pay | Admitting: Family Medicine

## 2023-02-21 ENCOUNTER — Other Ambulatory Visit: Payer: Self-pay | Admitting: Family Medicine

## 2023-02-25 ENCOUNTER — Encounter: Payer: PRIVATE HEALTH INSURANCE | Admitting: Family Medicine

## 2023-03-01 ENCOUNTER — Other Ambulatory Visit (INDEPENDENT_AMBULATORY_CARE_PROVIDER_SITE_OTHER): Payer: PRIVATE HEALTH INSURANCE

## 2023-03-01 DIAGNOSIS — E785 Hyperlipidemia, unspecified: Secondary | ICD-10-CM | POA: Diagnosis not present

## 2023-03-01 DIAGNOSIS — Z125 Encounter for screening for malignant neoplasm of prostate: Secondary | ICD-10-CM

## 2023-03-01 DIAGNOSIS — E1169 Type 2 diabetes mellitus with other specified complication: Secondary | ICD-10-CM | POA: Diagnosis not present

## 2023-03-01 DIAGNOSIS — E1159 Type 2 diabetes mellitus with other circulatory complications: Secondary | ICD-10-CM

## 2023-03-01 DIAGNOSIS — E890 Postprocedural hypothyroidism: Secondary | ICD-10-CM | POA: Diagnosis not present

## 2023-03-01 LAB — COMPREHENSIVE METABOLIC PANEL
ALT: 24 U/L (ref 0–53)
AST: 22 U/L (ref 0–37)
Albumin: 4.5 g/dL (ref 3.5–5.2)
Alkaline Phosphatase: 74 U/L (ref 39–117)
BUN: 16 mg/dL (ref 6–23)
CO2: 32 meq/L (ref 19–32)
Calcium: 9.5 mg/dL (ref 8.4–10.5)
Chloride: 103 meq/L (ref 96–112)
Creatinine, Ser: 1.05 mg/dL (ref 0.40–1.50)
GFR: 74.18 mL/min (ref >60.00)
Glucose, Bld: 104 mg/dL — ABNORMAL HIGH (ref 70–99)
Potassium: 4 meq/L (ref 3.5–5.1)
Sodium: 141 meq/L (ref 135–145)
Total Bilirubin: 1 mg/dL (ref 0.2–1.2)
Total Protein: 6.5 g/dL (ref 6.0–8.3)

## 2023-03-01 LAB — LIPID PANEL
Cholesterol: 170 mg/dL (ref 0–200)
HDL: 48.3 mg/dL (ref >39.00)
LDL Cholesterol: 99 mg/dL (ref 0–99)
NonHDL: 121.27
Total CHOL/HDL Ratio: 4
Triglycerides: 109 mg/dL (ref 0.0–149.0)
VLDL: 21.8 mg/dL (ref 0.0–40.0)

## 2023-03-01 LAB — HEMOGLOBIN A1C: Hgb A1c MFr Bld: 6.5 % (ref 4.6–6.5)

## 2023-03-01 LAB — MICROALBUMIN / CREATININE URINE RATIO
Creatinine,U: 130.2 mg/dL
Microalb Creat Ratio: 0.5 mg/g (ref 0.0–30.0)
Microalb, Ur: <0.7 mg/dL (ref 0.0–1.9)

## 2023-03-01 LAB — PSA, MEDICARE: PSA: 2.94 ng/mL (ref 0.10–4.00)

## 2023-03-01 LAB — TSH: TSH: 0.08 u[IU]/mL — ABNORMAL LOW (ref 0.35–5.50)

## 2023-03-01 LAB — T4, FREE: Free T4: 1.5 ng/dL (ref 0.60–1.60)

## 2023-03-01 LAB — T3, FREE: T3, Free: 3.6 pg/mL (ref 2.3–4.2)

## 2023-03-01 NOTE — Progress Notes (Signed)
 No critical labs need to be addressed urgently. We will discuss labs in detail at upcoming office visit.

## 2023-03-09 ENCOUNTER — Ambulatory Visit: Payer: PRIVATE HEALTH INSURANCE | Admitting: Family Medicine

## 2023-03-09 ENCOUNTER — Encounter: Payer: Self-pay | Admitting: Family Medicine

## 2023-03-09 VITALS — BP 138/70 | HR 81 | Temp 97.9°F | Ht 67.5 in | Wt 205.6 lb

## 2023-03-09 DIAGNOSIS — E7800 Pure hypercholesterolemia, unspecified: Secondary | ICD-10-CM

## 2023-03-09 DIAGNOSIS — Z0000 Encounter for general adult medical examination without abnormal findings: Secondary | ICD-10-CM | POA: Diagnosis not present

## 2023-03-09 DIAGNOSIS — Z1211 Encounter for screening for malignant neoplasm of colon: Secondary | ICD-10-CM

## 2023-03-09 DIAGNOSIS — N401 Enlarged prostate with lower urinary tract symptoms: Secondary | ICD-10-CM

## 2023-03-09 DIAGNOSIS — E1159 Type 2 diabetes mellitus with other circulatory complications: Secondary | ICD-10-CM

## 2023-03-09 DIAGNOSIS — E1169 Type 2 diabetes mellitus with other specified complication: Secondary | ICD-10-CM

## 2023-03-09 DIAGNOSIS — T782XXD Anaphylactic shock, unspecified, subsequent encounter: Secondary | ICD-10-CM

## 2023-03-09 DIAGNOSIS — E890 Postprocedural hypothyroidism: Secondary | ICD-10-CM

## 2023-03-09 DIAGNOSIS — I152 Hypertension secondary to endocrine disorders: Secondary | ICD-10-CM

## 2023-03-09 DIAGNOSIS — E785 Hyperlipidemia, unspecified: Secondary | ICD-10-CM

## 2023-03-09 DIAGNOSIS — R351 Nocturia: Secondary | ICD-10-CM

## 2023-03-09 LAB — HM DIABETES FOOT EXAM

## 2023-03-09 NOTE — Patient Instructions (Addendum)
 Start at bedtime Xyzal at night.  Follow up if not improving as expected.  Get back on track with low cholesterol low fat heart healthy diet.  Pick up stool stool test in lab on way out... return for colon cancer screening.

## 2023-03-09 NOTE — Assessment & Plan Note (Signed)
 Chronic moderate control on max flomax .. not interested in treating with additional med.  PSA stable in nml range.

## 2023-03-09 NOTE — Assessment & Plan Note (Signed)
 Ever since severe anaphylactic allergic reaction first in 2021 and then again in 2023 he has had continued chest congestion. PFTs in 2021 showed mild restrictive disease no COPD or asthma. Will have him try Xyzal at bedtime to see if there is a chronic allergic issue. Consider repeat pulmonary function test, possible trial of Singulair versus inhaler.

## 2023-03-09 NOTE — Assessment & Plan Note (Addendum)
 Inadequate control, chronic.  Continue current medication.  Will get back on track with lifestyle change.. recheck in 3 months.  Pravastatin  20 mg p.o. daily  Zetia  10 mg p.o. daily

## 2023-03-09 NOTE — Assessment & Plan Note (Deleted)
 Inadequate control, chronic.  Continue current medication.  Pravastatin  20 mg p.o. daily  Zetia  10 mg p.o. daily

## 2023-03-09 NOTE — Assessment & Plan Note (Signed)
Stable, chronic.  Continue current medication.   Levo 125 mcg daily 

## 2023-03-09 NOTE — Progress Notes (Signed)
 Patient ID: Kenzi Zambo, male    DOB: Mar 21, 1955, 68 y.o.   MRN: [id number]  This visit was conducted in person.  BP 138/70   Pulse 81   Temp 97.9 F (36.6 C) (Oral)   Ht 5' 7.5" (1.715 m)   Wt 205 lb 9.6 oz (93.3 kg)   SpO2 96%   BMI 31.73 kg/m    CC:  Chief Complaint  Patient presents with   Annual Exam    Patient states that he feel he still has some phlegm in his chest.     Subjective:   HPI: Ura Slaight is a 68 y.o. male presenting on 03/09/2023 for Annual Exam (Patient states that he feel he still has some phlegm in his chest. )  The patient presents for annual complete physical and review of chronic health problems. He/She also has the following acute concerns today:   For a year noting more phlegm, some DOE, no worse... started after 2021 allergic reaction... never fiully went away.  Reviewed 2021 allergist note.. Dr. Danae Duncans  spirometry showed: possible restrictive disease with no improvement in FEV1 post bronchodilator treatment.   Hx of smoking.  On flonase  2 sprays   BPH :  on flomax .    Diabetes:   well controlled Lab Results  Component Value Date   HGBA1C 6.5 03/01/2023  Using medications without difficulties: Hypoglycemic episodes: Hyperglycemic episodes: Feet problems: no ulcers Blood Sugars averaging: not checking eye exam within last year:   scheduled next month.  Hypertension:   On amlodipine  BP Readings from Last 3 Encounters:  03/09/23 138/70  09/08/22 118/60  04/30/22 124/70  Using medication without problems or lightheadedness:  no  Chest pain with exertion: non change Edema:none Short of breath: no change Average home BPs: Other issues:  Elevated Cholesterol: LDL  NOT at goal  < 70 ( CAD) on pravastatin  20 mg p.o. daily and zetia  10 mg daily Lab Results  Component Value Date   CHOL 170 03/01/2023   HDL 48.30 03/01/2023   LDLCALC 99 03/01/2023   TRIG 109.0 03/01/2023   CHOLHDL 4 03/01/2023  Using medications without  problems: Muscle aches:  Diet compliance: heart healthy diet Exercise: minimal in winter Other complaints:   Hypothyroid : Normal free T3 and free T4 on levothyroxine  125 mcg daily Lab Results  Component Value Date   TSH 0.08 (L) 03/01/2023         Relevant past medical, surgical, family and social history reviewed and updated as indicated. Interim medical history since our last visit reviewed. Allergies and medications reviewed and updated. Outpatient Medications Prior to Visit  Medication Sig Dispense Refill   amLODipine  (NORVASC ) 5 MG tablet TAKE 1 TABLET BY MOUTH EVERY DAY 90 tablet 0   aspirin 81 MG tablet Take 81 mg by mouth daily.       dexlansoprazole  (DEXILANT ) 60 MG capsule TAKE 1 CAPSULE(60 MG) BY MOUTH DAILY     EPINEPHrine  0.3 mg/0.3 mL IJ SOAJ injection SMARTSIG:0.3 Milligram(s) IM Once PRN 1 each 0   ezetimibe  (ZETIA ) 10 MG tablet TAKE 1 TABLET(10 MG) BY MOUTH DAILY 90 tablet 3   fluticasone  (FLONASE ) 50 MCG/ACT nasal spray SHAKE LIQUID AND USE 2 SPRAYS IN EACH NOSTRIL DAILY 48 g 0   levothyroxine  (SYNTHROID ) 125 MCG tablet TAKE 1 TABLET(125 MCG) BY MOUTH DAILY BEFORE BREAKFAST 90 tablet 0   pantoprazole  (PROTONIX ) 40 MG tablet TAKE 1 TABLET(40 MG) BY MOUTH DAILY 90 tablet 0   pravastatin  (PRAVACHOL ) 20 MG  tablet TAKE 1 TABLET(20 MG) BY MOUTH DAILY 90 tablet 0   tamsulosin  (FLOMAX ) 0.4 MG CAPS capsule TAKE 2 CAPSULES(0.8 MG) BY MOUTH DAILY 180 capsule 0   valACYclovir  (VALTREX ) 1000 MG tablet Take 2 tablets (2,000 mg total) by mouth 2 (two) times daily. For 1 day. 20 tablet 1   predniSONE  (DELTASONE ) 20 MG tablet Take 3 tablets my mouth once daily in the morning for 3 days, then 2 tablets for 3 days, then 1 tablet for 3 days. 18 tablet 0   No facility-administered medications prior to visit.     Per HPI unless specifically indicated in ROS section below Review of Systems  Constitutional:  Negative for fatigue and fever.  HENT:  Negative for ear pain.   Eyes:   Negative for pain.  Respiratory:  Negative for cough.   Cardiovascular:  Negative for palpitations and leg swelling.  Gastrointestinal:  Negative for abdominal pain.  Genitourinary:  Negative for dysuria.  Musculoskeletal:  Negative for arthralgias.  Neurological:  Negative for syncope, light-headedness and headaches.  Psychiatric/Behavioral:  Negative for dysphoric mood.    Objective:  BP 138/70   Pulse 81   Temp 97.9 F (36.6 C) (Oral)   Ht 5' 7.5" (1.715 m)   Wt 205 lb 9.6 oz (93.3 kg)   SpO2 96%   BMI 31.73 kg/m   Wt Readings from Last 3 Encounters:  03/09/23 205 lb 9.6 oz (93.3 kg)  09/08/22 199 lb (90.3 kg)  04/30/22 207 lb 4.8 oz (94 kg)      Physical Exam Constitutional:      General: He is not in acute distress.    Appearance: Normal appearance. He is well-developed. He is not ill-appearing or toxic-appearing.  HENT:     Head: Normocephalic and atraumatic.     Right Ear: Hearing, tympanic membrane, ear canal and external ear normal.     Left Ear: Hearing, tympanic membrane, ear canal and external ear normal.     Nose: Nose normal.     Mouth/Throat:     Pharynx: Uvula midline.  Eyes:     General: Lids are normal. Lids are everted, no foreign bodies appreciated.     Conjunctiva/sclera: Conjunctivae normal.     Pupils: Pupils are equal, round, and reactive to light.  Neck:     Thyroid : No thyroid  mass or thyromegaly.     Vascular: No carotid bruit.     Trachea: Trachea and phonation normal.  Cardiovascular:     Rate and Rhythm: Normal rate and regular rhythm.     Pulses:          Dorsalis pedis pulses are 0 on the right side and 0 on the left side.       Posterior tibial pulses are 0 on the right side and 0 on the left side.     Heart sounds: S1 normal and S2 normal. No murmur heard.    No gallop.  Pulmonary:     Breath sounds: Normal breath sounds. No wheezing, rhonchi or rales.  Abdominal:     General: Bowel sounds are normal.     Palpations: Abdomen is  soft.     Tenderness: There is no abdominal tenderness. There is no guarding or rebound.     Hernia: No hernia is present.  Musculoskeletal:     Cervical back: Normal range of motion and neck supple.     Right lower leg: No edema.     Left lower leg: No edema.  Lymphadenopathy:  Cervical: No cervical adenopathy.  Skin:    General: Skin is warm and dry.     Findings: No rash.  Neurological:     Mental Status: He is alert.     Cranial Nerves: No cranial nerve deficit.     Sensory: No sensory deficit.     Gait: Gait normal.     Deep Tendon Reflexes: Reflexes are normal and symmetric.  Psychiatric:        Speech: Speech normal.        Behavior: Behavior normal.        Judgment: Judgment normal.     Diabetic foot exam: Normal inspection No skin breakdown No calluses  Normal DP pulses Normal sensation to light touch and monofilament Nails normal     Results for orders placed or performed in visit on 03/09/23  HM DIABETES FOOT EXAM   Collection Time: 03/09/23 12:00 AM  Result Value Ref Range   HM Diabetic Foot Exam done      COVID 19 screen:  No recent travel or known exposure to COVID19 The patient denies respiratory symptoms of COVID 19 at this time. The importance of social distancing was discussed today.   Assessment and Plan The patient's preventative maintenance and recommended screening tests for an annual wellness exam were reviewed in full today. Brought up to date unless services declined.  Counselled on the importance of diet, exercise, and its role in overall health and mortality. The patient's FH and SH was reviewed, including their home life, tobacco status, and drug and alcohol status.    Vaccines:   high due  flu (  possibly caused reaction in past), uptodate with td, shingrix, flu, refused PNA Prostate Cancer Screen:   Lab Results  Component Value Date   PSA 2.94 03/01/2023   PSA 2.48 01/29/2022   PSA 3.49 01/06/2021  HIV:   Refused Nonsmoker Hep C: done  Colonoscopy:  overdue, trying to take care of father... will do ifob. Stable pulm nodule 2013-2015.Aaron Aas No further imaging needed.    Problem List Items Addressed This Visit     Anaphylactic syndrome   Ever since severe anaphylactic allergic reaction first in 2021 and then again in 2023 he has had continued chest congestion. PFTs in 2021 showed mild restrictive disease no COPD or asthma. Will have him try Xyzal at bedtime to see if there is a chronic allergic issue. Consider repeat pulmonary function test, possible trial of Singulair versus inhaler.      BPH associated with nocturia (Chronic)   Chronic moderate control on max flomax .. not interested in treating with additional med.  PSA stable in nml range.      RESOLVED: HYPERCHOLESTEROLEMIA (Chronic)   Hyperlipidemia associated with type 2 diabetes mellitus (HCC)   Inadequate control, chronic.  Continue current medication.  Will get back on track with lifestyle change.. recheck in 3 months.  Pravastatin  20 mg p.o. daily  Zetia  10 mg p.o. daily      Hypertension associated with diabetes (HCC)   Stable, chronic.  Continue current medication.  Amlodipine  5 mg p.o. daily      Hypothyroidism (Chronic)   Stable, chronic.  Continue current medication.  Levo 125 mcg daily      Type 2 diabetes mellitus with other circulatory complications (HCC)   Chronic, diet controlled. Associated with hypertension.      Other Visit Diagnoses       Routine general medical examination at a health care facility    -  Primary  Colon cancer screening       Relevant Orders   Fecal occult blood, imunochemical        Herby Lolling, MD

## 2023-03-09 NOTE — Assessment & Plan Note (Signed)
Chronic, diet controlled. Associated with hypertension.

## 2023-03-09 NOTE — Assessment & Plan Note (Signed)
Stable, chronic.  Continue current medication.  Amlodipine 5 mg p.o. daily

## 2023-03-27 ENCOUNTER — Other Ambulatory Visit: Payer: Self-pay | Admitting: Family Medicine

## 2023-04-16 ENCOUNTER — Other Ambulatory Visit: Payer: Self-pay | Admitting: Family Medicine

## 2023-04-18 ENCOUNTER — Other Ambulatory Visit: Payer: Self-pay | Admitting: Family Medicine

## 2023-05-09 ENCOUNTER — Other Ambulatory Visit: Payer: Self-pay | Admitting: Family Medicine

## 2023-05-16 ENCOUNTER — Encounter: Payer: Self-pay | Admitting: Family Medicine

## 2023-05-16 ENCOUNTER — Ambulatory Visit: Payer: PRIVATE HEALTH INSURANCE | Admitting: Family Medicine

## 2023-05-16 VITALS — BP 132/82 | HR 75 | Temp 97.8°F | Ht 67.5 in | Wt 209.6 lb

## 2023-05-16 DIAGNOSIS — E7800 Pure hypercholesterolemia, unspecified: Secondary | ICD-10-CM

## 2023-05-16 DIAGNOSIS — Z1211 Encounter for screening for malignant neoplasm of colon: Secondary | ICD-10-CM | POA: Diagnosis not present

## 2023-05-16 DIAGNOSIS — E890 Postprocedural hypothyroidism: Secondary | ICD-10-CM

## 2023-05-16 DIAGNOSIS — E785 Hyperlipidemia, unspecified: Secondary | ICD-10-CM

## 2023-05-16 DIAGNOSIS — Z23 Encounter for immunization: Secondary | ICD-10-CM | POA: Diagnosis not present

## 2023-05-16 DIAGNOSIS — E1169 Type 2 diabetes mellitus with other specified complication: Secondary | ICD-10-CM

## 2023-05-16 MED ORDER — LEVOTHYROXINE SODIUM 100 MCG PO TABS: 100.0000 ug | ORAL_TABLET | Freq: Every day | ORAL | 3 refills | Status: DC

## 2023-05-16 NOTE — Progress Notes (Signed)
 Subjective:    Patient ID: Brett Lowery, male    DOB: 11-23-1955, 68 y.o.   MRN: [id number]  HPI Patient is here today to establish care.  He has a history of hypertension, hyperlipidemia, hypothyroidism.  His PSA was checked in January and was normal.  His colonoscopy was checked in 2016.  His gastroenterologist recommended a repeat colonoscopy in 5 years due to the history of polyps.  Therefore he is overdue for this.  He is due for the pneumonia vaccine.  Shingles shot is up-to-date.  Blood pressure is excellent today 132/82.  Patient had fasting lab work in January which was significant for a TSH of 0.08.  He is currently on levothyroxine 125 mcg a day. Past Medical History:  Diagnosis Date   CAD (coronary artery disease)    Chest pain    Disorders of bursae and tendons in shoulder region, unspecified    R, tendonitis   DM (diabetes mellitus) (HCC)    w/o complications   Dyslipidemia    Eczema    GERD (gastroesophageal reflux disease)    HTN (hypertension)    Hypercholesterolemia    Hypothyroidism    Insomnia    Serrated adenoma of colon 2009   Special screening for malignant neoplasm of prostate    Special screening for malignant neoplasms, colon    Well adult exam    Past Surgical History:  Procedure Laterality Date   CARDIAC CATHETERIZATION  2009   shoed no obstructive CAD, medical management    CLOSED MANIPULATION SHOULDER WITH STERIOD INJECTION Left 01/18/2017   Procedure: CLOSED MANIPULATION SHOULDER WITH STEROID INJECTION;  Surgeon: Sheral Apley, MD;  Location: [facility];  Service: Orthopedics;  Laterality: Left;   cornia transplants  2003   DG ESOPHAGUS -BA SW  2005   mod reflux and hiatal hernia   HERNIA REPAIR  2002   THYROIDECTOMY  2004   thyroid nodile   Current Outpatient Medications on File Prior to Visit  Medication Sig Dispense Refill   amLODipine (NORVASC) 5 MG tablet TAKE 1 TABLET BY MOUTH EVERY DAY 90 tablet 3   aspirin 81 MG  tablet Take 81 mg by mouth daily.       dexlansoprazole (DEXILANT) 60 MG capsule TAKE 1 CAPSULE(60 MG) BY MOUTH DAILY     EPINEPHrine 0.3 mg/0.3 mL IJ SOAJ injection SMARTSIG:0.3 Milligram(s) IM Once PRN 1 each 0   ezetimibe (ZETIA) 10 MG tablet TAKE 1 TABLET(10 MG) BY MOUTH DAILY 90 tablet 3   fluticasone (FLONASE) 50 MCG/ACT nasal spray SHAKE LIQUID AND USE 2 SPRAYS IN EACH NOSTRIL DAILY 48 g 3   pantoprazole (PROTONIX) 40 MG tablet TAKE 1 TABLET(40 MG) BY MOUTH DAILY 90 tablet 3   pravastatin (PRAVACHOL) 20 MG tablet TAKE 1 TABLET(20 MG) BY MOUTH DAILY 90 tablet 3   tamsulosin (FLOMAX) 0.4 MG CAPS capsule TAKE 2 CAPSULES(0.8 MG) BY MOUTH DAILY 180 capsule 3   valACYclovir (VALTREX) 1000 MG tablet Take 2 tablets (2,000 mg total) by mouth 2 (two) times daily. For 1 day. 20 tablet 1   No current facility-administered medications on file prior to visit.   No Known Allergies Social History   Socioeconomic History   Marital status: Married    Spouse name: Not on file   Number of children: Not on file   Years of education: Not on file   Highest education level: Not on file  Occupational History   Not on file  Tobacco Use  Smoking status: Former    Current packs/day: 0.00    Types: Cigarettes    Quit date: 03/28/2001    Years since quitting: 22.1   Smokeless tobacco: Never   Tobacco comments:    40 pack year hx   Vaping Use   Vaping status: Never Used  Substance and Sexual Activity   Alcohol use: No   Drug use: No   Sexual activity: Not on file  Other Topics Concern   Not on file  Social History Narrative   Married x30 years; 2 daughters 5-23   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   Diet: fruit and veggies. Occ. Fast food on weekends.       designated party release form signed authorizing Elease Hashimoto C. Rosol (wife) [phone]. Daine Gip 12/12/09.    Social Drivers of Corporate investment banker Strain: Not on file  Food Insecurity: Not on  file  Transportation Needs: Not on file  Physical Activity: Not on file  Stress: Not on file  Social Connections: Not on file  Intimate Partner Violence: Not on file   Family History  Problem Relation Age of Onset   Allergic rhinitis Maternal Grandfather    Heart attack Neg Hx        < age 40   Colon cancer Neg Hx    Esophageal cancer Neg Hx    Rectal cancer Neg Hx    Stomach cancer Neg Hx    Asthma Neg Hx    Eczema Neg Hx    Urticaria Neg Hx       Review of Systems     Objective:   Physical Exam Vitals reviewed.  Constitutional:      General: He is not in acute distress.    Appearance: Normal appearance. He is normal weight. He is not ill-appearing, toxic-appearing or diaphoretic.  HENT:     Mouth/Throat:     Mouth: Mucous membranes are moist.     Pharynx: Oropharynx is clear. No oropharyngeal exudate or posterior oropharyngeal erythema.  Cardiovascular:     Rate and Rhythm: Normal rate and regular rhythm.     Pulses: Normal pulses.     Heart sounds: Normal heart sounds. No murmur heard.    No friction rub. No gallop.  Pulmonary:     Effort: Pulmonary effort is normal. No respiratory distress.     Breath sounds: Normal breath sounds. No wheezing, rhonchi or rales.  Abdominal:     General: Abdomen is flat. Bowel sounds are normal. There is no distension.     Tenderness: There is no abdominal tenderness. There is no guarding or rebound.  Neurological:     General: No focal deficit present.     Mental Status: He is oriented to person, place, and time. Mental status is at baseline.           Assessment & Plan:  Colon cancer screening - Plan: Ambulatory referral to Gastroenterology  Pure hypercholesterolemia - Plan: CT CARDIAC SCORING (SELF PAY ONLY)  Postoperative hypothyroidism  Hyperlipidemia associated with type 2 diabetes mellitus (HCC) Schedule the patient for a colonoscopy as this is overdue.  Patient received a pneumonia vaccine today.  Recommended  reducing his dose of levothyroxine to 100 mcg a day and rechecking a TSH in 2 months.  Blood pressure and cholesterol are excellent.  Discussed a low carbohydrate diet.  Given his diabetes and hyperlipidemia and family history of coronary artery disease, I recommended getting a coronary artery calcium  score.  If significantly elevated, may consider switching Zetia to Repatha to achieve an LDL cholesterol of 55.

## 2023-05-17 NOTE — Addendum Note (Signed)
 Addended by: Venia Carbon K on: 05/17/2023 10:53 AM   Modules accepted: Orders

## 2023-05-20 ENCOUNTER — Other Ambulatory Visit: Payer: Self-pay | Admitting: Family Medicine

## 2023-06-24 ENCOUNTER — Ambulatory Visit (AMBULATORY_SURGERY_CENTER): Payer: PRIVATE HEALTH INSURANCE

## 2023-06-24 ENCOUNTER — Encounter: Payer: Self-pay | Admitting: Gastroenterology

## 2023-06-24 VITALS — Ht 67.5 in | Wt 200.0 lb

## 2023-06-24 DIAGNOSIS — Z860100 Personal history of colon polyps, unspecified: Secondary | ICD-10-CM

## 2023-06-24 MED ORDER — NA SULFATE-K SULFATE-MG SULF 17.5-3.13-1.6 GM/177ML PO SOLN: 1.0000 | Freq: Once | ORAL | 0 refills | Status: AC

## 2023-06-24 NOTE — Progress Notes (Signed)

## 2023-07-05 ENCOUNTER — Encounter: Payer: Self-pay | Admitting: Gastroenterology

## 2023-07-05 ENCOUNTER — Ambulatory Visit (AMBULATORY_SURGERY_CENTER): Payer: PRIVATE HEALTH INSURANCE | Admitting: Gastroenterology

## 2023-07-05 VITALS — BP 141/72 | HR 57 | Temp 97.9°F | Resp 12 | Ht 67.5 in | Wt 200.0 lb

## 2023-07-05 DIAGNOSIS — K5730 Diverticulosis of large intestine without perforation or abscess without bleeding: Secondary | ICD-10-CM

## 2023-07-05 DIAGNOSIS — Z860100 Personal history of colon polyps, unspecified: Secondary | ICD-10-CM

## 2023-07-05 DIAGNOSIS — K6289 Other specified diseases of anus and rectum: Secondary | ICD-10-CM

## 2023-07-05 DIAGNOSIS — K648 Other hemorrhoids: Secondary | ICD-10-CM

## 2023-07-05 DIAGNOSIS — Z860101 Personal history of adenomatous and serrated colon polyps: Secondary | ICD-10-CM | POA: Diagnosis not present

## 2023-07-05 DIAGNOSIS — Z1211 Encounter for screening for malignant neoplasm of colon: Secondary | ICD-10-CM | POA: Diagnosis present

## 2023-07-05 MED ORDER — SODIUM CHLORIDE 0.9 % IV SOLN: 500.0000 mL | Freq: Once | INTRAVENOUS | Status: DC

## 2023-07-05 NOTE — Op Note (Signed)
 [facility] Patient Name: Brett Lowery Procedure Date: 07/05/2023 8:29 AM MRN: [id number] Endoscopist: Ace Abu L. Dominic Friendly , MD, [id number] Age: 68 Referring MD:  Date of Birth: 07/10/1955 Gender: Male Account #: [id number] Procedure:                Colonoscopy Indications:              Personal history of colonic polyps                           no polyps May 2016 (Dr. Sandrea Cruel) adenomatous polyps                            on prior colonoscopy Medicines:                Monitored Anesthesia Care Procedure:                Pre-Anesthesia Assessment:                           - Prior to the procedure, a History and Physical                            was performed, and patient medications and                            allergies were reviewed. The patient's tolerance of                            previous anesthesia was also reviewed. The risks                            and benefits of the procedure and the sedation                            options and risks were discussed with the patient.                            All questions were answered, and informed consent                            was obtained. Prior Anticoagulants: The patient has                            taken no anticoagulant or antiplatelet agents. ASA                            Grade Assessment: II - A patient with mild systemic                            disease. After reviewing the risks and benefits,                            the patient was deemed in satisfactory condition to  undergo the procedure.                           After obtaining informed consent, the colonoscope                            was passed under direct vision. Throughout the                            procedure, the patient's blood pressure, pulse, and                            oxygen saturations were monitored continuously. The                            CF [id number] #[id number] was introduced through the anus                             and advanced to the the cecum, identified by                            appendiceal orifice and ileocecal valve. The                            colonoscopy was performed without difficulty. The                            patient tolerated the procedure well. The quality                            of the bowel preparation was good. The ileocecal                            valve, appendiceal orifice, and rectum were                            photographed. Scope In: 8:45:24 AM Scope Out: 8:58:13 AM Scope Withdrawal Time: 0 hours 10 minutes 15 seconds  Total Procedure Duration: 0 hours 12 minutes 49 seconds  Findings:                 The perianal and digital rectal examinations were                            normal.                           Repeat examination of right colon under NBI                            performed.                           Multiple diverticula were found in the left colon.  Internal hemorrhoids were found. The hemorrhoids                            were small.                           The exam was otherwise without abnormality on                            direct and retroflexion views (except hypertrophied                            anal papilla). Complications:            No immediate complications. Estimated Blood Loss:     Estimated blood loss: none. Impression:               - Diverticulosis in the left colon.                           - Internal hemorrhoids.                           - The examination was otherwise normal on direct                            and retroflexion views.                           - No specimens collected. Recommendation:           - Patient has a contact number available for                            emergencies. The signs and symptoms of potential                            delayed complications were discussed with the                            patient. Return to normal  activities tomorrow.                            Written discharge instructions were provided to the                            patient.                           - Resume previous diet.                           - Continue present medications.                           - Repeat colonoscopy in 10 years for screening  purposes. Nishaan Stanke L. Dominic Friendly, MD 07/05/2023 9:01:58 AM This report has been signed electronically.

## 2023-07-05 NOTE — Progress Notes (Signed)
 Pt's states no medical or surgical changes since previsit or office visit.

## 2023-07-05 NOTE — Patient Instructions (Signed)

## 2023-07-05 NOTE — Progress Notes (Signed)
 History and Physical:  This patient presents for endoscopic testing for: Encounter Diagnosis  Name Primary?   Hx of colonic polyps Yes    Surveillance colonoscopy today for Hx polyps. No polyps May 2016 - adenomatous polyps prior to that. Patient denies chronic abdominal pain, rectal bleeding, constipation or diarrhea.   Patient is otherwise without complaints or active issues today.   Past Medical History: Past Medical History:  Diagnosis Date   CAD (coronary artery disease)    Chest pain    Disorders of bursae and tendons in shoulder region, unspecified    R, tendonitis   DM (diabetes mellitus) (HCC)    w/o complications   Dyslipidemia    Eczema    GERD (gastroesophageal reflux disease)    HTN (hypertension)    Hypercholesterolemia    Hypothyroidism    Insomnia    Serrated adenoma of colon 2009   Special screening for malignant neoplasm of prostate    Special screening for malignant neoplasms, colon    Well adult exam      Past Surgical History: Past Surgical History:  Procedure Laterality Date   CARDIAC CATHETERIZATION  2009   shoed no obstructive CAD, medical management    CLOSED MANIPULATION SHOULDER WITH STERIOD INJECTION Left 01/18/2017   Procedure: CLOSED MANIPULATION SHOULDER WITH STEROID INJECTION;  Surgeon: Saundra Curl, MD;  Location: [facility];  Service: Orthopedics;  Laterality: Left;   cornia transplants  2003   DG ESOPHAGUS -BA SW  2005   mod reflux and hiatal hernia   HERNIA REPAIR  2002   THYROIDECTOMY  2004   thyroid  nodile    Allergies: No Known Allergies  Outpatient Meds: Current Outpatient Medications  Medication Sig Dispense Refill   amLODipine  (NORVASC ) 5 MG tablet TAKE 1 TABLET BY MOUTH EVERY DAY 90 tablet 3   aspirin 81 MG tablet Take 81 mg by mouth daily.       dexlansoprazole  (DEXILANT ) 60 MG capsule TAKE 1 CAPSULE(60 MG) BY MOUTH DAILY     ezetimibe  (ZETIA ) 10 MG tablet TAKE 1 TABLET(10 MG) BY MOUTH DAILY  90 tablet 3   levothyroxine  (SYNTHROID ) 100 MCG tablet Take 1 tablet (100 mcg total) by mouth daily. 90 tablet 3   pantoprazole  (PROTONIX ) 40 MG tablet TAKE 1 TABLET(40 MG) BY MOUTH DAILY 90 tablet 3   pravastatin  (PRAVACHOL ) 20 MG tablet TAKE 1 TABLET(20 MG) BY MOUTH DAILY 90 tablet 3   tamsulosin  (FLOMAX ) 0.4 MG CAPS capsule TAKE 2 CAPSULES(0.8 MG) BY MOUTH DAILY 180 capsule 3   EPINEPHrine  0.3 mg/0.3 mL IJ SOAJ injection SMARTSIG:0.3 Milligram(s) IM Once PRN 1 each 0   fluticasone  (FLONASE ) 50 MCG/ACT nasal spray SHAKE LIQUID AND USE 2 SPRAYS IN EACH NOSTRIL DAILY 48 g 3   valACYclovir  (VALTREX ) 1000 MG tablet Take 2 tablets (2,000 mg total) by mouth 2 (two) times daily. For 1 day. 20 tablet 1   Current Facility-Administered Medications  Medication Dose Route Frequency Provider Last Rate Last Admin   0.9 %  sodium chloride  infusion  500 mL Intravenous Once Danis, Laterria Lasota L III, MD          ___________________________________________________________________ Objective   Exam:  BP (!) 152/85   Pulse 71   Temp 97.9 F (36.6 C)   Ht 5' 7.5" (1.715 m)   Wt 200 lb (90.7 kg)   SpO2 95%   BMI 30.86 kg/m   CV: regular , S1/S2 Resp: clear to auscultation bilaterally, normal RR and effort noted GI:  soft, no tenderness, with active bowel sounds.   Assessment: Encounter Diagnosis  Name Primary?   Hx of colonic polyps Yes     Plan: Colonoscopy  The benefits and risks of the planned procedure(s) were described in detail with the patient or (when appropriate) their health care proxy.  Risks were outlined as including, but not limited to, bleeding, infection, perforation, adverse medication reaction leading to cardiac or pulmonary decompensation, pancreatitis (if ERCP).  The limitation of incomplete mucosal visualization was also discussed.  No guarantees or warranties were given.  The patient is appropriate for an endoscopic procedure in the ambulatory setting.   - Lorella Roles,  MD

## 2023-07-05 NOTE — Progress Notes (Signed)
 To pacu, VSS. Report to [email or web address].tb

## 2023-07-06 ENCOUNTER — Telehealth: Payer: Self-pay

## 2023-07-06 NOTE — Telephone Encounter (Signed)
  Follow up Call-     07/05/2023    7:41 AM  Call back number  Post procedure Call Back phone  # [phone]  Permission to leave phone message Yes     Patient questions:  Do you have a fever, pain , or abdominal swelling? No. Pain Score  0 *  Have you tolerated food without any problems? Yes.    Have you been able to return to your normal activities? Yes.    Do you have any questions about your discharge instructions: Diet   No. Medications  No. Follow up visit  No.  Do you have questions or concerns about your Care? No.  Actions: * If pain score is 4 or above: No action needed, pain <4.

## 2023-07-15 ENCOUNTER — Other Ambulatory Visit: Payer: PRIVATE HEALTH INSURANCE

## 2023-07-15 DIAGNOSIS — E1159 Type 2 diabetes mellitus with other circulatory complications: Secondary | ICD-10-CM

## 2023-07-15 DIAGNOSIS — Z125 Encounter for screening for malignant neoplasm of prostate: Secondary | ICD-10-CM

## 2023-07-15 DIAGNOSIS — E7800 Pure hypercholesterolemia, unspecified: Secondary | ICD-10-CM

## 2023-07-15 DIAGNOSIS — E890 Postprocedural hypothyroidism: Secondary | ICD-10-CM

## 2023-07-15 DIAGNOSIS — E876 Hypokalemia: Secondary | ICD-10-CM

## 2023-07-16 LAB — HEMOGLOBIN A1C
Hgb A1c MFr Bld: 6.3 % — ABNORMAL HIGH (ref <5.7)
Mean Plasma Glucose: 134 mg/dL
eAG (mmol/L): 7.4 mmol/L

## 2023-07-16 LAB — CBC WITH DIFFERENTIAL/PLATELET
Absolute Lymphocytes: 1446 {cells}/uL (ref 850–3900)
Absolute Monocytes: 576 {cells}/uL (ref 200–950)
Basophils Absolute: 60 {cells}/uL (ref 0–200)
Basophils Relative: 1 %
Eosinophils Absolute: 222 {cells}/uL (ref 15–500)
Eosinophils Relative: 3.7 %
HCT: 43.4 % (ref 38.5–50.0)
Hemoglobin: 14.5 g/dL (ref 13.2–17.1)
MCH: 29.2 pg (ref 27.0–33.0)
MCHC: 33.4 g/dL (ref 32.0–36.0)
MCV: 87.5 fL (ref 80.0–100.0)
MPV: 10.9 fL (ref 7.5–12.5)
Monocytes Relative: 9.6 %
Neutro Abs: 3696 {cells}/uL (ref 1500–7800)
Neutrophils Relative %: 61.6 %
Platelets: 221 10*3/uL (ref 140–400)
RBC: 4.96 10*6/uL (ref 4.20–5.80)
RDW: 13.3 % (ref 11.0–15.0)
Total Lymphocyte: 24.1 %
WBC: 6 10*3/uL (ref 3.8–10.8)

## 2023-07-16 LAB — COMPLETE METABOLIC PANEL WITHOUT GFR
AG Ratio: 2.3 (calc) (ref 1.0–2.5)
ALT: 17 U/L (ref 9–46)
AST: 18 U/L (ref 10–35)
Albumin: 4.3 g/dL (ref 3.6–5.1)
Alkaline phosphatase (APISO): 63 U/L (ref 35–144)
BUN: 16 mg/dL (ref 7–25)
CO2: 28 mmol/L (ref 20–32)
Calcium: 9 mg/dL (ref 8.6–10.3)
Chloride: 106 mmol/L (ref 98–110)
Creat: 1.02 mg/dL (ref 0.70–1.35)
Globulin: 1.9 g/dL (ref 1.9–3.7)
Glucose, Bld: 105 mg/dL — ABNORMAL HIGH (ref 65–99)
Potassium: 3.9 mmol/L (ref 3.5–5.3)
Sodium: 142 mmol/L (ref 135–146)
Total Bilirubin: 0.8 mg/dL (ref 0.2–1.2)
Total Protein: 6.2 g/dL (ref 6.1–8.1)

## 2023-07-16 LAB — TSH: TSH: 0.23 m[IU]/L — ABNORMAL LOW (ref 0.40–4.50)

## 2023-07-16 LAB — PSA: PSA: 2.64 ng/mL (ref <=4.00)

## 2023-07-16 LAB — LIPID PANEL
Cholesterol: 136 mg/dL (ref <200)
HDL: 49 mg/dL (ref >=40)
LDL Cholesterol (Calc): 71 mg/dL
Non-HDL Cholesterol (Calc): 87 mg/dL (ref <130)
Total CHOL/HDL Ratio: 2.8 (calc) (ref <5.0)
Triglycerides: 75 mg/dL (ref <150)

## 2023-07-18 ENCOUNTER — Ambulatory Visit: Payer: Self-pay | Admitting: Family Medicine

## 2023-07-26 ENCOUNTER — Ambulatory Visit: Payer: PRIVATE HEALTH INSURANCE | Admitting: Family Medicine

## 2023-07-26 ENCOUNTER — Encounter: Payer: Self-pay | Admitting: Family Medicine

## 2023-07-26 VITALS — BP 132/72 | HR 71 | Temp 98.0°F | Ht 67.5 in | Wt 204.0 lb

## 2023-07-26 DIAGNOSIS — R053 Chronic cough: Secondary | ICD-10-CM

## 2023-07-26 MED ORDER — FAMOTIDINE 40 MG PO TABS: 40.0000 mg | ORAL_TABLET | Freq: Every day | ORAL | 5 refills | Status: DC

## 2023-07-26 MED ORDER — SILDENAFIL CITRATE 100 MG PO TABS: 50.0000 mg | ORAL_TABLET | Freq: Every day | ORAL | 11 refills | Status: AC | PRN

## 2023-07-26 MED ORDER — LEVOTHYROXINE SODIUM 88 MCG PO TABS: 88.0000 ug | ORAL_TABLET | Freq: Every day | ORAL | 3 refills | Status: AC

## 2023-07-26 NOTE — Progress Notes (Signed)
 Subjective:    Patient ID: Brett Lowery, male    DOB: 17-Nov-1955, 68 y.o.   MRN: [id number]  Cough   When I last saw the patient, he was taking levothyroxine  125 mcg a day his TSH was 0.08.  I reduced his dose to 100 mg a day and recheck lab work last week and his TSH had improved to 0.23.  Patient also reports a chronic cough off and on for the last 2 years.  He constantly clears his throat.  He has daily severe heartburn despite taking Protonix .  He has tried elevating the head of his bed however the heartburn continues.  He denies any hemoptysis.  He denies any pleurisy.  He denies any wheezing.  He does occasionally have postnasal drip. Past Medical History:  Diagnosis Date   CAD (coronary artery disease)    Chest pain    Disorders of bursae and tendons in shoulder region, unspecified    R, tendonitis   DM (diabetes mellitus) (HCC)    w/o complications   Dyslipidemia    Eczema    GERD (gastroesophageal reflux disease)    HTN (hypertension)    Hypercholesterolemia    Hypothyroidism    Insomnia    Serrated adenoma of colon 2009   Special screening for malignant neoplasm of prostate    Special screening for malignant neoplasms, colon    Well adult exam    Past Surgical History:  Procedure Laterality Date   CARDIAC CATHETERIZATION  2009   shoed no obstructive CAD, medical management    CLOSED MANIPULATION SHOULDER WITH STERIOD INJECTION Left 01/18/2017   Procedure: CLOSED MANIPULATION SHOULDER WITH STEROID INJECTION;  Surgeon: Saundra Curl, MD;  Location: [facility];  Service: Orthopedics;  Laterality: Left;   cornia transplants  2003   DG ESOPHAGUS -BA SW  2005   mod reflux and hiatal hernia   HERNIA REPAIR  2002   THYROIDECTOMY  2004   thyroid  nodile   Current Outpatient Medications on File Prior to Visit  Medication Sig Dispense Refill   amLODipine  (NORVASC ) 5 MG tablet TAKE 1 TABLET BY MOUTH EVERY DAY 90 tablet 3   aspirin 81 MG tablet Take 81 mg  by mouth daily.       EPINEPHrine  0.3 mg/0.3 mL IJ SOAJ injection SMARTSIG:0.3 Milligram(s) IM Once PRN 1 each 0   ezetimibe  (ZETIA ) 10 MG tablet TAKE 1 TABLET(10 MG) BY MOUTH DAILY 90 tablet 3   fluticasone  (FLONASE ) 50 MCG/ACT nasal spray SHAKE LIQUID AND USE 2 SPRAYS IN EACH NOSTRIL DAILY 48 g 3   pantoprazole  (PROTONIX ) 40 MG tablet TAKE 1 TABLET(40 MG) BY MOUTH DAILY 90 tablet 3   pravastatin  (PRAVACHOL ) 20 MG tablet TAKE 1 TABLET(20 MG) BY MOUTH DAILY 90 tablet 3   tamsulosin  (FLOMAX ) 0.4 MG CAPS capsule TAKE 2 CAPSULES(0.8 MG) BY MOUTH DAILY 180 capsule 3   valACYclovir  (VALTREX ) 1000 MG tablet Take 2 tablets (2,000 mg total) by mouth 2 (two) times daily. For 1 day. 20 tablet 1   No current facility-administered medications on file prior to visit.   No Known Allergies Social History   Socioeconomic History   Marital status: Married    Spouse name: Not on file   Number of children: Not on file   Years of education: Not on file   Highest education level: Not on file  Occupational History   Not on file  Tobacco Use   Smoking status: Former    Current packs/day: 0.00  Types: Cigarettes    Quit date: 03/28/2001    Years since quitting: 22.3   Smokeless tobacco: Never   Tobacco comments:    40 pack year hx   Vaping Use   Vaping status: Never Used  Substance and Sexual Activity   Alcohol use: No   Drug use: No   Sexual activity: Not on file  Other Topics Concern   Not on file  Social History Narrative   Married x30 years; 2 daughters 5-23   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   Diet: fruit and veggies. Occ. Fast food on weekends.       designated party release form signed authorizing Devra Fontana C. Neddo (wife) [phone]. Nilsa Bash 12/12/09.    Social Drivers of Corporate investment banker Strain: Not on file  Food Insecurity: Not on file  Transportation Needs: Not on file  Physical Activity: Not on file  Stress: Not on file   Social Connections: Not on file  Intimate Partner Violence: Not on file   Family History  Problem Relation Age of Onset   Allergic rhinitis Maternal Grandfather    Heart attack Neg Hx        < age 26   Colon cancer Neg Hx    Esophageal cancer Neg Hx    Rectal cancer Neg Hx    Stomach cancer Neg Hx    Asthma Neg Hx    Eczema Neg Hx    Urticaria Neg Hx    Colon polyps Neg Hx       Review of Systems  Respiratory:  Positive for cough.        Objective:   Physical Exam Vitals reviewed.  Constitutional:      General: He is not in acute distress.    Appearance: Normal appearance. He is normal weight. He is not ill-appearing, toxic-appearing or diaphoretic.  HENT:     Mouth/Throat:     Mouth: Mucous membranes are moist.     Pharynx: Oropharynx is clear. No oropharyngeal exudate or posterior oropharyngeal erythema.  Cardiovascular:     Rate and Rhythm: Normal rate and regular rhythm.     Pulses: Normal pulses.     Heart sounds: Normal heart sounds. No murmur heard.    No friction rub. No gallop.  Pulmonary:     Effort: Pulmonary effort is normal. No respiratory distress.     Breath sounds: Normal breath sounds. No wheezing, rhonchi or rales.  Abdominal:     General: Abdomen is flat. Bowel sounds are normal. There is no distension.     Tenderness: There is no abdominal tenderness. There is no guarding or rebound.  Neurological:     General: No focal deficit present.     Mental Status: He is oriented to person, place, and time. Mental status is at baseline.           Assessment & Plan:  Chronic cough - Plan: DG Chest 2 View Reduce levothyroxine  to 88 mcg a day.  Recheck TSH in 6 months.  I suspect laryngal esophageal reflux.  Add Pepcid  40 mg a day to Protonix  and elevate the head of the bed.  Check a chest x-ray.  The other possible cause would be postnasal drip.  Continue Flonase 

## 2023-08-09 ENCOUNTER — Telehealth: Payer: Self-pay | Admitting: Family Medicine

## 2023-08-09 NOTE — Telephone Encounter (Signed)
 Lvm for pt to call so we can find out if he has had an eye exam and if so where so we can request the report. If not I would like to offer an appt for this to be completed in office.

## 2023-10-11 ENCOUNTER — Encounter: Payer: Self-pay | Admitting: Family Medicine

## 2023-10-11 ENCOUNTER — Ambulatory Visit: Payer: PRIVATE HEALTH INSURANCE | Admitting: Family Medicine

## 2023-10-11 VITALS — BP 124/72 | HR 79 | Temp 97.7°F | Ht 67.5 in | Wt 205.4 lb

## 2023-10-11 DIAGNOSIS — R197 Diarrhea, unspecified: Secondary | ICD-10-CM | POA: Diagnosis not present

## 2023-10-11 DIAGNOSIS — E890 Postprocedural hypothyroidism: Secondary | ICD-10-CM

## 2023-10-11 NOTE — Progress Notes (Signed)
 Subjective:    Patient ID: Brett Lowery, male    DOB: 1956-01-07, 68 y.o.   MRN: [id number]  Patient had a colonoscopy earlier this year that came back completely clear.  He states that for the last 2 months he has been having watery diarrhea every day.  He states that he goes to the bathroom 4-5 times every morning.  He does not have a lot of stool but every then it comes out is runny or watery.  He denies any travel.  He denies any recent use of antibiotics.  He has been taking acid medicines for heartburn.  He denies any blood in his stool.  He denies any fevers or chills.  He denies any weight loss.  He denies any abdominal pain.  He states that he feels fine other than the inconvenience of watery stool.  He has tried Imodium without relief. Past Medical History:  Diagnosis Date   CAD (coronary artery disease)    Chest pain    Disorders of bursae and tendons in shoulder region, unspecified    R, tendonitis   DM (diabetes mellitus) (HCC)    w/o complications   Dyslipidemia    Eczema    GERD (gastroesophageal reflux disease)    HTN (hypertension)    Hypercholesterolemia    Hypothyroidism    Insomnia    Serrated adenoma of colon 2009   Special screening for malignant neoplasm of prostate    Special screening for malignant neoplasms, colon    Well adult exam    Past Surgical History:  Procedure Laterality Date   CARDIAC CATHETERIZATION  2009   shoed no obstructive CAD, medical management    CLOSED MANIPULATION SHOULDER WITH STERIOD INJECTION Left 01/18/2017   Procedure: CLOSED MANIPULATION SHOULDER WITH STEROID INJECTION;  Surgeon: Beverley Evalene BIRCH, MD;  Location: [facility];  Service: Orthopedics;  Laterality: Left;   cornia transplants  2003   DG ESOPHAGUS -BA SW  2005   mod reflux and hiatal hernia   HERNIA REPAIR  2002   THYROIDECTOMY  2004   thyroid  nodile   Current Outpatient Medications on File Prior to Visit  Medication Sig Dispense Refill    amLODipine  (NORVASC ) 5 MG tablet TAKE 1 TABLET BY MOUTH EVERY DAY 90 tablet 3   aspirin 81 MG tablet Take 81 mg by mouth daily.       EPINEPHrine  0.3 mg/0.3 mL IJ SOAJ injection SMARTSIG:0.3 Milligram(s) IM Once PRN 1 each 0   ezetimibe  (ZETIA ) 10 MG tablet TAKE 1 TABLET(10 MG) BY MOUTH DAILY 90 tablet 3   famotidine  (PEPCID ) 40 MG tablet Take 1 tablet (40 mg total) by mouth daily. 30 tablet 5   fluticasone  (FLONASE ) 50 MCG/ACT nasal spray SHAKE LIQUID AND USE 2 SPRAYS IN EACH NOSTRIL DAILY 48 g 3   levothyroxine  (SYNTHROID ) 88 MCG tablet Take 1 tablet (88 mcg total) by mouth daily. 90 tablet 3   pantoprazole  (PROTONIX ) 40 MG tablet TAKE 1 TABLET(40 MG) BY MOUTH DAILY 90 tablet 3   pravastatin  (PRAVACHOL ) 20 MG tablet TAKE 1 TABLET(20 MG) BY MOUTH DAILY 90 tablet 3   sildenafil  (VIAGRA ) 100 MG tablet Take 0.5-1 tablets (50-100 mg total) by mouth daily as needed for erectile dysfunction. 5 tablet 11   tamsulosin  (FLOMAX ) 0.4 MG CAPS capsule TAKE 2 CAPSULES(0.8 MG) BY MOUTH DAILY 180 capsule 3   valACYclovir  (VALTREX ) 1000 MG tablet Take 2 tablets (2,000 mg total) by mouth 2 (two) times daily. For 1 day. 20  tablet 1   No current facility-administered medications on file prior to visit.   No Known Allergies Social History   Socioeconomic History   Marital status: Married    Spouse name: Not on file   Number of children: Not on file   Years of education: Not on file   Highest education level: Not on file  Occupational History   Not on file  Tobacco Use   Smoking status: Former    Current packs/day: 0.00    Types: Cigarettes    Quit date: 03/28/2001    Years since quitting: 22.5   Smokeless tobacco: Never   Tobacco comments:    40 pack year hx   Vaping Use   Vaping status: Never Used  Substance and Sexual Activity   Alcohol use: No   Drug use: No   Sexual activity: Not on file  Other Topics Concern   Not on file  Social History Narrative   Married x30 years; 2 daughters 5-23    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   Diet: fruit and veggies. Occ. Fast food on weekends.       designated party release form signed authorizing Avelina C. Glogowski (wife) [phone]. Montie Moats 12/12/09.    Social Drivers of Corporate investment banker Strain: Not on file  Food Insecurity: Not on file  Transportation Needs: Not on file  Physical Activity: Not on file  Stress: Not on file  Social Connections: Not on file  Intimate Partner Violence: Not on file   Family History  Problem Relation Age of Onset   Allergic rhinitis Maternal Grandfather    Heart attack Neg Hx        < age 65   Colon cancer Neg Hx    Esophageal cancer Neg Hx    Rectal cancer Neg Hx    Stomach cancer Neg Hx    Asthma Neg Hx    Eczema Neg Hx    Urticaria Neg Hx    Colon polyps Neg Hx       Review of Systems  Gastrointestinal:  Positive for diarrhea.       Objective:   Physical Exam Vitals reviewed.  Constitutional:      General: He is not in acute distress.    Appearance: Normal appearance. He is normal weight. He is not ill-appearing, toxic-appearing or diaphoretic.  HENT:     Mouth/Throat:     Mouth: Mucous membranes are moist.     Pharynx: Oropharynx is clear. No oropharyngeal exudate or posterior oropharyngeal erythema.  Cardiovascular:     Rate and Rhythm: Normal rate and regular rhythm.     Pulses: Normal pulses.     Heart sounds: Normal heart sounds. No murmur heard.    No friction rub. No gallop.  Pulmonary:     Effort: Pulmonary effort is normal. No respiratory distress.     Breath sounds: Normal breath sounds. No wheezing, rhonchi or rales.  Abdominal:     General: Abdomen is flat. Bowel sounds are normal. There is no distension.     Tenderness: There is no abdominal tenderness. There is no guarding or rebound.  Neurological:     General: No focal deficit present.     Mental Status: He is oriented to person, place, and time. Mental status  is at baseline.           Assessment & Plan:  Postoperative hypothyroidism - Plan: TSH  Diarrhea, unspecified type Differential diagnosis includes  IBS with diarrhea, inflammatory bowel disease, infectious diarrhea, malabsorptive diarrhea.  Prior to starting a workup, I recommended trying Metamucil as a stool binding agent coupled with a probiotic to see if this will help with diarrhea.  If not, I will check stool cultures, stool O&P, stool for C. difficile toxin AMB, fecal elastase to evaluate for malabsorptive diarrhea, I will check a celiac panel.  Colonoscopy is up-to-date.  While the patient is here today I will check a TSH to follow-up on his hypothyroidism

## 2023-10-12 LAB — TSH: TSH: 1.44 m[IU]/L (ref 0.40–4.50)

## 2023-10-13 ENCOUNTER — Ambulatory Visit: Payer: Self-pay | Admitting: Family Medicine

## 2023-10-25 ENCOUNTER — Other Ambulatory Visit: Payer: Self-pay | Admitting: Family Medicine

## 2023-10-27 ENCOUNTER — Other Ambulatory Visit: Payer: Self-pay | Admitting: Family Medicine

## 2023-10-28 NOTE — Telephone Encounter (Signed)
 Too soon for refill, LRF 07/26/23 for 30 and 5 RF.  Requested Prescriptions  Pending Prescriptions Disp Refills   famotidine  (PEPCID ) 40 MG tablet [Pharmacy Med Name: FAMOTIDINE  40MG  TABLETS] 30 tablet 5    Sig: TAKE 1 TABLET(40 MG) BY MOUTH DAILY     Gastroenterology:  H2 Antagonists Passed - 10/28/2023  8:04 AM      Passed - Valid encounter within last 12 months    Recent Outpatient Visits           2 weeks ago Postoperative hypothyroidism   [place] [facility] Duanne Butler DASEN, MD   3 months ago Chronic cough   [place] [facility] Duanne, Butler DASEN, MD   5 months ago Colon cancer screening   [place] [facility] Pickard, Butler DASEN, MD

## 2023-12-19 ENCOUNTER — Ambulatory Visit: Payer: PRIVATE HEALTH INSURANCE | Admitting: Family Medicine

## 2023-12-19 VITALS — BP 144/82 | HR 64 | Temp 97.8°F | Ht 67.5 in | Wt 213.0 lb

## 2023-12-19 DIAGNOSIS — M79641 Pain in right hand: Secondary | ICD-10-CM | POA: Diagnosis not present

## 2023-12-19 LAB — URIC ACID: Uric Acid, Serum: 4.9 mg/dL (ref 4.0–8.0)

## 2023-12-19 NOTE — Progress Notes (Signed)
 Subjective:    Patient ID: Brett Lowery, male    DOB: 07/13/1955, 68 y.o.   MRN: [id number]  Patient states that suddenly, on Tuesday last week, he developed severe pain in his right hand.  The pain was located in his wrist, the [facility] joint of his right thumb, the MCP joint of his right thumb, his entire hand up to the MCP joints of his fingers.  He states that his hand was swollen.  Hurts to touch.  Hurts to move his fingers.  This lasted several days and then suddenly about Friday the pain improved.  Today he is back to his normal baseline.  He has full range of motion in the PIP, DIP, and MCP joints of all the digits on his right hand.  There is no erythema or swelling.  There is no warmth.  He has a negative Tinel's sign.  He has a negative Phalen sign.  He has full range of motion in his right wrist. Past Medical History:  Diagnosis Date   CAD (coronary artery disease)    Chest pain    Disorders of bursae and tendons in shoulder region, unspecified    R, tendonitis   DM (diabetes mellitus) (HCC)    w/o complications   Dyslipidemia    Eczema    GERD (gastroesophageal reflux disease)    HTN (hypertension)    Hypercholesterolemia    Hypothyroidism    Insomnia    Serrated adenoma of colon 2009   Special screening for malignant neoplasm of prostate    Special screening for malignant neoplasms, colon    Well adult exam    Past Surgical History:  Procedure Laterality Date   CARDIAC CATHETERIZATION  2009   shoed no obstructive CAD, medical management    CLOSED MANIPULATION SHOULDER WITH STERIOD INJECTION Left 01/18/2017   Procedure: CLOSED MANIPULATION SHOULDER WITH STEROID INJECTION;  Surgeon: Beverley Evalene BIRCH, MD;  Location: [facility];  Service: Orthopedics;  Laterality: Left;   cornia transplants  2003   DG ESOPHAGUS -BA SW  2005   mod reflux and hiatal hernia   HERNIA REPAIR  2002   THYROIDECTOMY  2004   thyroid  nodile   Current Outpatient Medications on File  Prior to Visit  Medication Sig Dispense Refill   amLODipine  (NORVASC ) 5 MG tablet TAKE 1 TABLET BY MOUTH EVERY DAY 90 tablet 3   aspirin 81 MG tablet Take 81 mg by mouth daily.       ezetimibe  (ZETIA ) 10 MG tablet TAKE 1 TABLET(10 MG) BY MOUTH DAILY 90 tablet 3   famotidine  (PEPCID ) 40 MG tablet Take 1 tablet (40 mg total) by mouth daily. 30 tablet 5   fluticasone  (FLONASE ) 50 MCG/ACT nasal spray SHAKE LIQUID AND USE 2 SPRAYS IN EACH NOSTRIL DAILY 48 g 3   levothyroxine  (SYNTHROID ) 88 MCG tablet Take 1 tablet (88 mcg total) by mouth daily. 90 tablet 3   pantoprazole  (PROTONIX ) 40 MG tablet TAKE 1 TABLET(40 MG) BY MOUTH DAILY 90 tablet 3   pravastatin  (PRAVACHOL ) 20 MG tablet TAKE 1 TABLET(20 MG) BY MOUTH DAILY 90 tablet 3   sildenafil  (VIAGRA ) 100 MG tablet Take 0.5-1 tablets (50-100 mg total) by mouth daily as needed for erectile dysfunction. 5 tablet 11   tamsulosin  (FLOMAX ) 0.4 MG CAPS capsule TAKE 2 CAPSULES(0.8 MG) BY MOUTH DAILY 180 capsule 3   valACYclovir  (VALTREX ) 1000 MG tablet Take 2 tablets (2,000 mg total) by mouth 2 (two) times daily. For 1 day. 20  tablet 1   EPINEPHrine  0.3 mg/0.3 mL IJ SOAJ injection SMARTSIG:0.3 Milligram(s) IM Once PRN (Patient not taking: Reported on 12/19/2023) 1 each 0   No current facility-administered medications on file prior to visit.   No Known Allergies Social History   Socioeconomic History   Marital status: Married    Spouse name: Not on file   Number of children: Not on file   Years of education: Not on file   Highest education level: Not on file  Occupational History   Not on file  Tobacco Use   Smoking status: Former    Current packs/day: 0.00    Types: Cigarettes    Quit date: 03/28/2001    Years since quitting: 22.7   Smokeless tobacco: Never   Tobacco comments:    40 pack year hx   Vaping Use   Vaping status: Never Used  Substance and Sexual Activity   Alcohol use: No   Drug use: No   Sexual activity: Not on file  Other  Topics Concern   Not on file  Social History Narrative   Married x30 years; 2 daughters 5-23   Occupation: heating and sport and exercise psychologist. Does not get regular exercise.   Diet: fruit and veggies. Occ. Fast food on weekends.       designated party release form signed authorizing Avelina C. Albaugh (wife) [phone]. Montie Moats 12/12/09.    Social Drivers of Corporate Investment Banker Strain: Not on file  Food Insecurity: Not on file  Transportation Needs: Not on file  Physical Activity: Not on file  Stress: Not on file  Social Connections: Not on file  Intimate Partner Violence: Not on file   Family History  Problem Relation Age of Onset   Allergic rhinitis Maternal Grandfather    Heart attack Neg Hx        < age 60   Colon cancer Neg Hx    Esophageal cancer Neg Hx    Rectal cancer Neg Hx    Stomach cancer Neg Hx    Asthma Neg Hx    Eczema Neg Hx    Urticaria Neg Hx    Colon polyps Neg Hx       Review of Systems     Objective:   Physical Exam Vitals reviewed.  Constitutional:      General: He is not in acute distress.    Appearance: Normal appearance. He is normal weight. He is not ill-appearing, toxic-appearing or diaphoretic.  HENT:     Mouth/Throat:     Mouth: Mucous membranes are moist.     Pharynx: Oropharynx is clear. No oropharyngeal exudate or posterior oropharyngeal erythema.  Cardiovascular:     Rate and Rhythm: Normal rate and regular rhythm.     Pulses: Normal pulses.     Heart sounds: Normal heart sounds. No murmur heard.    No friction rub. No gallop.  Pulmonary:     Effort: Pulmonary effort is normal. No respiratory distress.     Breath sounds: Normal breath sounds. No wheezing, rhonchi or rales.  Abdominal:     General: Abdomen is flat. Bowel sounds are normal. There is no distension.     Tenderness: There is no abdominal tenderness. There is no guarding or rebound.  Musculoskeletal:     Right wrist: Normal. No swelling, deformity, effusion,  lacerations or tenderness. Normal range of motion.     Right hand: Normal. No swelling, deformity or lacerations.       Hands:  Neurological:  General: No focal deficit present.     Mental Status: He is oriented to person, place, and time. Mental status is at baseline.           Assessment & Plan:  Pain of right hand - Plan: Uric acid, DG Hand Complete Right My best guess based on the sudden onset and severity would be gout versus pseudogout.  Patient has never had a gout exacerbation.  The other possibility would be some sort of infection however it resolved spontaneously without any treatment.  I will check a uric acid level today in the blood to see if the patient is at risk for gout.  If the pain returns I have ordered an x-ray of his hand to evaluate further.  However the problem has resolved and requires no treatment at the present time

## 2023-12-20 ENCOUNTER — Ambulatory Visit: Payer: Self-pay | Admitting: Family Medicine

## 2024-01-23 ENCOUNTER — Other Ambulatory Visit: Payer: Self-pay | Admitting: Family Medicine

## 2024-03-24 ENCOUNTER — Other Ambulatory Visit: Payer: Self-pay | Admitting: Family Medicine

## 2024-03-28 ENCOUNTER — Other Ambulatory Visit: Payer: Self-pay | Admitting: Family Medicine

## 2024-03-28 NOTE — Telephone Encounter (Unsigned)
 Copied from CRM [id number]. Topic: Clinical - Medication Refill >> Mar 28, 2024 12:28 PM Ivette P wrote: Medication:  Ezetimibe  10 MG  Has the patient contacted their pharmacy? Yes (Agent: If no, request that the patient contact the pharmacy for the refill. If patient does not wish to contact the pharmacy document the reason why and proceed with request.) (Agent: If yes, when and what did the pharmacy advise?)  This is the patient's preferred pharmacy:  [id number] - [place], [place] - [address] [place] [address] Phone: [phone] Fax: [phone]  Is this the correct pharmacy for this prescription? Yes If no, delete pharmacy and type the correct one.   Has the prescription been filled recently? No  Is the patient out of the medication? Yes  Has the patient been seen for an appointment in the last year OR does the patient have an upcoming appointment? Yes  Can we respond through MyChart? Yes  Agent: Please be advised that Rx refills may take up to 3 business days. We ask that you follow-up with your pharmacy.

## 2024-03-30 MED ORDER — EZETIMIBE 10 MG PO TABS: 10.0000 mg | ORAL_TABLET | Freq: Every day | ORAL | 0 refills | Status: AC

## 2024-03-30 NOTE — Telephone Encounter (Signed)
 Requested Prescriptions  Pending Prescriptions Disp Refills   ezetimibe  (ZETIA ) 10 MG tablet 90 tablet 0    Sig: Take 1 tablet (10 mg total) by mouth daily.     Cardiovascular:  Antilipid - Sterol Transport Inhibitors Failed - 03/30/2024 10:40 AM      Failed - Lipid Panel in normal range within the last 12 months    Cholesterol  Date Value Ref Range Status  07/15/2023 136 <200 mg/dL Final   LDL Cholesterol (Calc)  Date Value Ref Range Status  07/15/2023 71 mg/dL (calc) Final    Comment:    Reference range: <100 . Desirable range <100 mg/dL for primary prevention;   <70 mg/dL for patients with CHD or diabetic patients  with > or = 2 CHD risk factors. SABRA LDL-C is now calculated using the Martin-Hopkins  calculation, which is a validated novel method providing  better accuracy than the Friedewald equation in the  estimation of LDL-C.  Gladis APPLETHWAITE et al. SANDREA. 7986;689(80): 2061-2068  (http://education.[facility].com/faq/FAQ164)    HDL  Date Value Ref Range Status  07/15/2023 49 > OR = 40 mg/dL Final   Triglycerides  Date Value Ref Range Status  07/15/2023 75 <150 mg/dL Final         Passed - AST in normal range and within 360 days    AST  Date Value Ref Range Status  07/15/2023 18 10 - 35 U/L Final         Passed - ALT in normal range and within 360 days    ALT  Date Value Ref Range Status  07/15/2023 17 9 - 46 U/L Final         Passed - Patient is not pregnant      Passed - Valid encounter within last 12 months    Recent Outpatient Visits           3 months ago Pain of right hand   [facility] Duanne Butler DASEN, MD   5 months ago Postoperative hypothyroidism   [facility] Duanne Butler DASEN, MD   8 months ago Chronic cough   [facility] Duanne, Butler DASEN, MD   10 months ago Colon cancer screening   [facility] Pickard, Butler DASEN, MD

## 2024-05-14 ENCOUNTER — Other Ambulatory Visit: Payer: PRIVATE HEALTH INSURANCE

## 2024-05-17 ENCOUNTER — Encounter: Payer: PRIVATE HEALTH INSURANCE | Admitting: Family Medicine
